# Patient Record
Sex: Female | Born: 1935 | Race: Black or African American | Hispanic: No | Smoking: Never smoker
Health system: Southern US, Community
[De-identification: ages and names within clinical notes are randomized; demographics above are authoritative.]

## PROBLEM LIST (undated history)

## (undated) DIAGNOSIS — N184 Chronic kidney disease, stage 4 (severe): Secondary | ICD-10-CM

## (undated) DIAGNOSIS — I5032 Chronic diastolic (congestive) heart failure: Secondary | ICD-10-CM

## (undated) DIAGNOSIS — I442 Atrioventricular block, complete: Secondary | ICD-10-CM

## (undated) DIAGNOSIS — I639 Cerebral infarction, unspecified: Secondary | ICD-10-CM

## (undated) DIAGNOSIS — I10 Essential (primary) hypertension: Secondary | ICD-10-CM

## (undated) HISTORY — DX: Atrioventricular block, complete: I44.2

## (undated) HISTORY — PX: CATARACT EXTRACTION: SUR2

---

## 1998-05-08 ENCOUNTER — Ambulatory Visit (HOSPITAL_COMMUNITY): Admission: RE | Admit: 1998-05-08 | Discharge: 1998-05-08 | Payer: Self-pay | Admitting: Orthopedic Surgery

## 1998-06-27 ENCOUNTER — Encounter (HOSPITAL_COMMUNITY): Admission: RE | Admit: 1998-06-27 | Discharge: 1998-09-25 | Payer: Self-pay | Admitting: *Deleted

## 1998-07-05 ENCOUNTER — Ambulatory Visit (HOSPITAL_COMMUNITY): Admission: RE | Admit: 1998-07-05 | Discharge: 1998-07-05 | Payer: Self-pay | Admitting: *Deleted

## 1999-05-02 ENCOUNTER — Ambulatory Visit (HOSPITAL_COMMUNITY): Admission: RE | Admit: 1999-05-02 | Discharge: 1999-05-02 | Payer: Self-pay | Admitting: Internal Medicine

## 1999-07-03 ENCOUNTER — Emergency Department (HOSPITAL_COMMUNITY): Admission: EM | Admit: 1999-07-03 | Discharge: 1999-07-03 | Payer: Self-pay | Admitting: Emergency Medicine

## 2000-11-10 ENCOUNTER — Ambulatory Visit (HOSPITAL_COMMUNITY): Admission: RE | Admit: 2000-11-10 | Discharge: 2000-11-10 | Payer: Self-pay | Admitting: Internal Medicine

## 2001-04-01 ENCOUNTER — Encounter: Payer: Self-pay | Admitting: Internal Medicine

## 2001-04-01 ENCOUNTER — Ambulatory Visit (HOSPITAL_COMMUNITY): Admission: RE | Admit: 2001-04-01 | Discharge: 2001-04-01 | Payer: Self-pay | Admitting: Internal Medicine

## 2001-10-18 ENCOUNTER — Encounter: Admission: RE | Admit: 2001-10-18 | Discharge: 2001-10-18 | Payer: Self-pay | Admitting: Internal Medicine

## 2001-10-18 ENCOUNTER — Encounter: Payer: Self-pay | Admitting: Internal Medicine

## 2001-11-22 ENCOUNTER — Ambulatory Visit (HOSPITAL_COMMUNITY): Admission: RE | Admit: 2001-11-22 | Discharge: 2001-11-22 | Payer: Self-pay | Admitting: Internal Medicine

## 2001-11-22 ENCOUNTER — Encounter: Payer: Self-pay | Admitting: Internal Medicine

## 2001-12-22 ENCOUNTER — Encounter: Payer: Self-pay | Admitting: Ophthalmology

## 2001-12-22 ENCOUNTER — Emergency Department (HOSPITAL_COMMUNITY): Admission: RE | Admit: 2001-12-22 | Discharge: 2001-12-22 | Payer: Self-pay | Admitting: Ophthalmology

## 2002-03-21 ENCOUNTER — Encounter: Payer: Self-pay | Admitting: Neurology

## 2002-03-21 ENCOUNTER — Ambulatory Visit (HOSPITAL_COMMUNITY): Admission: RE | Admit: 2002-03-21 | Discharge: 2002-03-21 | Payer: Self-pay | Admitting: Neurology

## 2002-06-02 ENCOUNTER — Emergency Department (HOSPITAL_COMMUNITY): Admission: EM | Admit: 2002-06-02 | Discharge: 2002-06-02 | Payer: Self-pay | Admitting: Emergency Medicine

## 2002-07-30 ENCOUNTER — Encounter: Payer: Self-pay | Admitting: Neurology

## 2002-07-30 ENCOUNTER — Ambulatory Visit (HOSPITAL_COMMUNITY): Admission: RE | Admit: 2002-07-30 | Discharge: 2002-07-30 | Payer: Self-pay | Admitting: Neurology

## 2003-03-29 ENCOUNTER — Encounter: Admission: RE | Admit: 2003-03-29 | Discharge: 2003-03-29 | Payer: Self-pay | Admitting: Orthopedic Surgery

## 2003-03-29 ENCOUNTER — Encounter: Payer: Self-pay | Admitting: Orthopedic Surgery

## 2003-04-24 ENCOUNTER — Encounter: Payer: Self-pay | Admitting: *Deleted

## 2003-04-24 ENCOUNTER — Ambulatory Visit (HOSPITAL_COMMUNITY): Admission: RE | Admit: 2003-04-24 | Discharge: 2003-04-24 | Payer: Self-pay | Admitting: *Deleted

## 2003-09-25 ENCOUNTER — Inpatient Hospital Stay (HOSPITAL_COMMUNITY): Admission: RE | Admit: 2003-09-25 | Discharge: 2003-09-28 | Payer: Self-pay | Admitting: Orthopedic Surgery

## 2003-09-28 ENCOUNTER — Inpatient Hospital Stay (HOSPITAL_COMMUNITY)
Admission: RE | Admit: 2003-09-28 | Discharge: 2003-10-02 | Payer: Self-pay | Admitting: Physical Medicine & Rehabilitation

## 2004-01-17 ENCOUNTER — Other Ambulatory Visit: Admission: RE | Admit: 2004-01-17 | Discharge: 2004-01-17 | Payer: Self-pay | Admitting: Internal Medicine

## 2004-02-07 ENCOUNTER — Encounter: Admission: RE | Admit: 2004-02-07 | Discharge: 2004-02-07 | Payer: Self-pay | Admitting: Orthopedic Surgery

## 2004-02-22 ENCOUNTER — Encounter: Admission: RE | Admit: 2004-02-22 | Discharge: 2004-02-22 | Payer: Self-pay | Admitting: Orthopedic Surgery

## 2004-03-07 ENCOUNTER — Other Ambulatory Visit: Admission: RE | Admit: 2004-03-07 | Discharge: 2004-03-07 | Payer: Self-pay | Admitting: Obstetrics and Gynecology

## 2004-03-09 ENCOUNTER — Emergency Department (HOSPITAL_COMMUNITY): Admission: EM | Admit: 2004-03-09 | Discharge: 2004-03-09 | Payer: Self-pay | Admitting: Emergency Medicine

## 2004-03-13 ENCOUNTER — Encounter: Admission: RE | Admit: 2004-03-13 | Discharge: 2004-03-13 | Payer: Self-pay | Admitting: Orthopedic Surgery

## 2004-04-02 ENCOUNTER — Encounter: Admission: RE | Admit: 2004-04-02 | Discharge: 2004-04-02 | Payer: Self-pay | Admitting: Orthopedic Surgery

## 2005-02-10 ENCOUNTER — Other Ambulatory Visit: Admission: RE | Admit: 2005-02-10 | Discharge: 2005-02-10 | Payer: Self-pay | Admitting: Internal Medicine

## 2006-08-04 ENCOUNTER — Ambulatory Visit (HOSPITAL_COMMUNITY): Admission: RE | Admit: 2006-08-04 | Discharge: 2006-08-04 | Payer: Self-pay | Admitting: Internal Medicine

## 2007-01-20 ENCOUNTER — Emergency Department (HOSPITAL_COMMUNITY): Admission: EM | Admit: 2007-01-20 | Discharge: 2007-01-20 | Payer: Self-pay | Admitting: Family Medicine

## 2007-03-31 ENCOUNTER — Encounter: Admission: RE | Admit: 2007-03-31 | Discharge: 2007-04-06 | Payer: Self-pay | Admitting: Otolaryngology

## 2008-05-21 ENCOUNTER — Emergency Department (HOSPITAL_COMMUNITY): Admission: EM | Admit: 2008-05-21 | Discharge: 2008-05-21 | Payer: Self-pay | Admitting: Family Medicine

## 2009-04-03 ENCOUNTER — Ambulatory Visit (HOSPITAL_COMMUNITY): Admission: RE | Admit: 2009-04-03 | Discharge: 2009-04-03 | Payer: Self-pay | Admitting: Internal Medicine

## 2009-04-07 ENCOUNTER — Encounter: Admission: RE | Admit: 2009-04-07 | Discharge: 2009-04-07 | Payer: Self-pay | Admitting: Orthopedic Surgery

## 2009-07-02 ENCOUNTER — Ambulatory Visit (HOSPITAL_COMMUNITY): Admission: RE | Admit: 2009-07-02 | Discharge: 2009-07-02 | Payer: Self-pay | Admitting: Internal Medicine

## 2009-07-06 ENCOUNTER — Inpatient Hospital Stay (HOSPITAL_COMMUNITY): Admission: AD | Admit: 2009-07-06 | Discharge: 2009-07-10 | Payer: Self-pay | Admitting: Internal Medicine

## 2010-01-09 ENCOUNTER — Encounter: Admission: RE | Admit: 2010-01-09 | Discharge: 2010-01-09 | Payer: Self-pay | Admitting: Orthopedic Surgery

## 2011-03-01 LAB — GLUCOSE, CAPILLARY
Glucose-Capillary: 92 mg/dL (ref 70–99)
Glucose-Capillary: 102 mg/dL — ABNORMAL HIGH (ref 70–99)
Glucose-Capillary: 116 mg/dL — ABNORMAL HIGH (ref 70–99)
Glucose-Capillary: 123 mg/dL — ABNORMAL HIGH (ref 70–99)
Glucose-Capillary: 140 mg/dL — ABNORMAL HIGH (ref 70–99)
Glucose-Capillary: 145 mg/dL — ABNORMAL HIGH (ref 70–99)
Glucose-Capillary: 146 mg/dL — ABNORMAL HIGH (ref 70–99)
Glucose-Capillary: 155 mg/dL — ABNORMAL HIGH (ref 70–99)
Glucose-Capillary: 223 mg/dL — ABNORMAL HIGH (ref 70–99)
Glucose-Capillary: 83 mg/dL (ref 70–99)

## 2011-03-01 LAB — CBC
HCT: 33.7 % — ABNORMAL LOW (ref 36.0–46.0)
HCT: 32.6 % — ABNORMAL LOW (ref 36.0–46.0)
HCT: 34.6 % — ABNORMAL LOW (ref 36.0–46.0)
Hemoglobin: 11.4 g/dL — ABNORMAL LOW (ref 12.0–15.0)
Hemoglobin: 11.1 g/dL — ABNORMAL LOW (ref 12.0–15.0)
Hemoglobin: 11.8 g/dL — ABNORMAL LOW (ref 12.0–15.0)
MCHC: 33.4 g/dL (ref 30.0–36.0)
MCHC: 33.9 g/dL (ref 30.0–36.0)
MCHC: 34.6 g/dL (ref 30.0–36.0)
MCV: 90.1 fL (ref 78.0–100.0)
MCV: 90.2 fL (ref 78.0–100.0)
MCV: 90.3 fL (ref 78.0–100.0)
MCV: 90.3 fL (ref 78.0–100.0)
Platelets: 242 10*3/uL (ref 150–400)
Platelets: 246 10*3/uL (ref 150–400)
RBC: 3.68 MIL/uL — ABNORMAL LOW (ref 3.87–5.11)
RBC: 3.62 MIL/uL — ABNORMAL LOW (ref 3.87–5.11)
RBC: 3.7 MIL/uL — ABNORMAL LOW (ref 3.87–5.11)
RBC: 3.84 MIL/uL — ABNORMAL LOW (ref 3.87–5.11)
RDW: 13.9 % (ref 11.5–15.5)
WBC: 8.3 10*3/uL (ref 4.0–10.5)
WBC: 8.5 10*3/uL (ref 4.0–10.5)
WBC: 9.2 10*3/uL (ref 4.0–10.5)

## 2011-03-01 LAB — BASIC METABOLIC PANEL
BUN: 27 mg/dL — ABNORMAL HIGH (ref 6–23)
CO2: 29 mEq/L (ref 19–32)
CO2: 26 mEq/L (ref 19–32)
CO2: 28 mEq/L (ref 19–32)
Calcium: 8.8 mg/dL (ref 8.4–10.5)
Calcium: 9.1 mg/dL (ref 8.4–10.5)
Chloride: 105 mEq/L (ref 96–112)
Chloride: 103 mEq/L (ref 96–112)
Chloride: 103 mEq/L (ref 96–112)
Creatinine, Ser: 1.06 mg/dL (ref 0.4–1.2)
Creatinine, Ser: 0.98 mg/dL (ref 0.4–1.2)
Creatinine, Ser: 1.06 mg/dL (ref 0.4–1.2)
GFR calc Af Amer: >60 mL/min (ref >60)
GFR calc non Af Amer: 49 mL/min — ABNORMAL LOW (ref >60)
Glucose, Bld: 117 mg/dL — ABNORMAL HIGH (ref 70–99)
Glucose, Bld: 201 mg/dL — ABNORMAL HIGH (ref 70–99)
Potassium: 3.8 mEq/L (ref 3.5–5.1)

## 2011-03-01 LAB — GLYCOHEMOGLOBIN, TOTAL: Hemoglobin-A1c: 7.2 % (ref 5.4–7.4)

## 2011-03-01 LAB — URINALYSIS, MICROSCOPIC ONLY
Ketones, ur: NEGATIVE mg/dL
Nitrite: NEGATIVE
Protein, ur: NEGATIVE mg/dL

## 2011-03-01 LAB — COMPREHENSIVE METABOLIC PANEL
ALT: 20 U/L (ref 0–35)
CO2: 27 mEq/L (ref 19–32)
Calcium: 9.1 mg/dL (ref 8.4–10.5)
Chloride: 104 mEq/L (ref 96–112)
Creatinine, Ser: 1.09 mg/dL (ref 0.4–1.2)
GFR calc non Af Amer: 48 mL/min — ABNORMAL LOW (ref >60)
Glucose, Bld: 196 mg/dL — ABNORMAL HIGH (ref 70–99)
Sodium: 137 mEq/L (ref 135–145)
Total Bilirubin: 0.8 mg/dL (ref 0.3–1.2)

## 2011-03-01 LAB — SEDIMENTATION RATE: Sed Rate: 27 mm/hr — ABNORMAL HIGH (ref 0–22)

## 2011-03-01 LAB — APTT: aPTT: 33 seconds (ref 24–37)

## 2011-03-15 DIAGNOSIS — I10 Essential (primary) hypertension: Secondary | ICD-10-CM | POA: Insufficient documentation

## 2011-03-17 ENCOUNTER — Other Ambulatory Visit (HOSPITAL_COMMUNITY)
Admission: RE | Admit: 2011-03-17 | Discharge: 2011-03-17 | Disposition: A | Discharge status: Home / Self Care | Payer: Medicare Other | Source: Ambulatory Visit | Attending: Internal Medicine | Admitting: Internal Medicine

## 2011-03-17 DIAGNOSIS — Z124 Encounter for screening for malignant neoplasm of cervix: Secondary | ICD-10-CM | POA: Insufficient documentation
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
## 2011-04-11 NOTE — Discharge Summary (Signed)
NAME:  Victoria Lloyd, Victoria Lloyd                       ACCOUNT NO.:  [id number]   MEDICAL RECORD NO.:  [id number]                   PATIENT TYPE:  IPS   LOCATION:  [id number]                                 FACILITY:  [facility]   PHYSICIAN:  John L. Rendall, M.D.               DATE OF BIRTH:  [birth year]   DATE OF ADMISSION:  09/28/2003  DATE OF DISCHARGE:  09/28/2003                                 DISCHARGE SUMMARY   ADMISSION DIAGNOSES:  1. End-stage osteoarthritis of bilateral knees, status post right knee     replacement in 1997.  2. Hypertension.  3. Diverticulosis with persistent diarrhea.  4. Degenerative joint disease of the thoracic spine.  5. Fibromyalgia.  6. Mild gastroesophageal reflux disease.  7. Osteoarthritis.  8. History of pelvic fracture.  9. History of chronic ear infection, treated with myringotomy tube.  10.      History of stroke in January 2001.   DISCHARGE DIAGNOSES:  1. End-stage osteoarthritis, bilateral knee, status post left knee     replacement in 2004 and right knee replacement in 1997.  2. Acute blood loss anemia secondary to surgery.  3. Mild confusion due to Dilaudid, now resolved.  4. Hypertension.  5. Diverticulosis with persistent diarrhea.  6. Degenerative joint disease, thoracic spine.  7. Fibromyalgia.  8. Mild gastroesophageal reflux disease.  9. Osteoarthritis.  10.      History of pelvic fracture.  11.      History of chronic ear infection treated with myringotomy tubes.  12.      History of stroke in January 2001.   SURGICAL PROCEDURE:  On September 25, 2003, Victoria Lloyd underwent a left total  knee arthroplasty by Dr. Oretha Caprice, assisted by Zenaida Deed, PA-C. She  had a DePuy LCS complete metal back patella cemented size standard with an  LCS complete primary femoral component cemented size standard left.  A DePuy  MBT Keels tibial tray cemented size 3 and an LCS complete RP insert size  standard 17.5 mm segment.   COMPLICATIONS:  None.   CONSULTATIONS:  1. Rehab medicine and physical therapy consult, September 26, 2003.  2. Occupational therapy consult, September 27, 2003.  3. Internal medicine consult by Dr. Jeanann Lewandowsky, September 28, 2003.   HISTORY OF PRESENT ILLNESS:  This 77 year old black female patient presented  to Dr. Telford Nab with a 10-year history of gradual onset of progressively  worsening left knee pain. She had done well, status post right knee  replacement in 1997, but the pain and instability of the knee on the left  has just gotten worse. At this point the pain is constant and aching over  the medial joint line without radiation. It increased with walking and  decreased with sitting, lying down, or heat. The knee pops and feels like it  might give way at times. She failed conservative treatment and because of  that she presented for left knee  replacement.   HOSPITAL COURSE:  Victoria Lloyd tolerated her surgical procedure well without  immediate postoperative complications.  She was subsequently transferred to  5000. On postoperative day one, temperature maximum was 100, vital signs  were stable. She was sedated still due to anesthesia and PCA, but pain was  well controlled. Her leg was neurovascularly intact. She was weaned off her  oxygen and started on PT per protocol.   On postoperative day two she had some problems with confusion during the  night, so PCA was discontinued. Temperature maximum was 101.3 and vital  signs were stable.  Hemoglobin was 8.4, hematocrit 25, and she was  asymptomatic. I was felt we could just watch this and Dr. Carlis Abbott was  consulted for medical management. The left knee incision was well  approximated with staples and minimal drainage. She was continued on therapy  and switched to p.o. pain medications.   On September 28, 2003, temperature maximum is 99.6, vital signs stable.  Hemoglobin 8.3, hematocrit 24.4. She remained asymptomatic with a low blood  count. Her left knee  incision was well approximated, but she does have calf  pain with palpation on the left. Both ankles, however, also tender to  palpation. We will get bilateral lower extremity Dopplers to rule out the  DVT and Dr. Carlis Abbott feels that we can keep her on iron and adjust her blood  pressure medications, and not transfuse her at this time.  She is ready for  a transfer to rehab when a rehab bed is available and she will be  transferred to rehab later today.   DISCHARGE INSTRUCTIONS:   DIET:  Continue her current hospitalization diet.   MEDICATIONS:  Continue current hospitalization medications with adjustments  to be made per the rehab physicians.   ACTIVITY:  She can be out of bed, weightbearing as tolerated on the left leg  with the use of a walker.  She is to continue PT and OT per rehab protocol.  On discharge, she is set up with [facility] for home health PT  and services. She will need to continue CPM at this time 0-100 degrees 16  hours a day.   WOUND CARE:  Please clean the left knee incision with Betadine daily and  apply a dry dressing.  Staples can be removed with Steri-Strips with Benzoin  applied on postoperative day 14. If she is still in rehab at that time it  can be done there; otherwise, she needs to follow up with Dr. Telford Nab at  that time.   FOLLOWUP:  She needs to follow up with Dr. Telford Nab in our office on  postoperative day 14 if her staples are still intact. Otherwise, she can  follow up with him about a week or so after her discharge from rehab. She  can call [phone] for that  appointment. Please notify Dr. Telford Nab if  temperature greater than 101.5, chills or pain unrelieved by pain  medications or foul-smelling drainage from the wounds.   LABORATORY DATA:  Chest x-ray done on September 19, 2003, showed a calcified  granulomata associated with hilar areas. No evidence of active disease.  On September 26, 2003, hemoglobin was 10.3, hematocrit 30.5.  On  September 27, 2003, hemoglobin was 8.4, hematocrit 25.  On September 28, 2003, white count  8.3, hematocrit 24.4, and platelet count 210,000. On September 26, 2003,  glucose was 175, calcium 8.2. On September 27, 2003, glucose was 154, calcium  8.4. All other  laboratory studies were within normal limits.      Vonita Moss Duffy, P.A.                      John L. Telford Nab, M.D.    KED/MEDQ  D:  09/28/2003  T:  09/28/2003  Job:  [id number]   cc:   Jeanann Lewandowsky, M.D.  [address] [place] [address]  Fax: [phone]

## 2011-04-11 NOTE — Consult Note (Signed)
[facility]  Patient:    Victoria Lloyd, Victoria Lloyd Visit Number: [id number] MRN: [id number]          Service Type: EMS Location: ED Attending Physician:  Nat Christen Dictated by:   Rob Hickman, M.D. Proc. Date: 06/02/02 Admit Date:  06/02/2002 Discharge Date: 06/02/2002   CC:         Don Broach. Carlis Abbott, M.D.   Consultation Report  DATE OF BIRTH:  [birth year].  BRIEF HISTORY:  I had the pleasure to see the patient in the [facility] Emergency Room.  The patient is noted to have sustained an injury to her left hand while in [facility] on Monday, May 30, 2002. The patient states that she slipped and fell over a cobblestone surface on her outstretched hand and injured the nondominant left hand at the base of her thumb joint.  She has had tried ice as well as hydrocodone for pain.  Since that time she has returned home and had x-rays taken and was referred to the emergency room for evaluation and treatment.  She denies numbness or tingling, locking, popping or catching.  Her main complaint is pain over the base of the thumb joint over the thumb.  The patient notes no ulnar sided wrist pain or index to small finger pain.  I have gone her examination with her at length today.  She was seen by Carmelina Peal, P.A.-C., and was referred to [facility] to rule out fracture.  PAST MEDICAL HISTORY:  Reviewed.  MEDICATIONS:  1. Aspirin.  2. Norvasc.  3. Diovan.  4. Celexa.  5. Vioxx  6. Prevacid.  7. Os-Cal.  8. Centrum silver.  9. Flonase. 10. Ultracet 11. Guanine sulfate. 12. Vitamin B6. 13. Osteobioflex.  ALLERGIES:  None.  PAST MEDICAL HISTORY:  1. Significant for osteoarthritis.  2. Allergic rhinitis.  3. Reflux.  4. Depression.  5. Hypertension.  6. History of right total knee replacement by Dr. Emogene Morgan. Lavender.  SOCIAL HISTORY:  She is a nonsmoker.  REGULAR PHYSICIAN:  Don Broach. Carlis Abbott, M.D.  PHYSICAL EXAMINATION:  GENERAL:   This is a very pleasant black female, alert and oriented, in no acute distress.  VITAL SIGNS:  The patient has normal pulses in the upper extremities.  Wrist is evaluated about the left hand and is significant for [facility] joint pain with palpation.  MP and IP joints are nontender.  There is no signs of dystrophy, vascular compromise, infection or peripheral nerve compression. Her main findings today are indicative of basilar thumb joint pain.  The right upper extremity is neurovascularly intact.  Normal stability, sensation, and no abnormality.  She ambulates well.  CHEST AND ABDOMEN:  BEnign.  X-RAYS:  X-rays were reviewed.  These are previously taken x-rays from outside the hospital and I have reviewed them at length today.  The patient has significant degenerative disk disease especially of the [facility] and IP joint of the left thumb.  The MCP joint is also involved but less so than the aforementioned joints.  IMPRESSION:  Base of thumb joint arthritis with extreme exacerbation status post fall in [place] 3 days ago.  PLAN:  I have discussed with the patient our findings.  I have placed in a removable splint and I have placed her on Vicodin for extreme pain.  she will return to the office in 7 days.  If not improved I would consider a corticosteroid injection at that point in time.  I have gone over  with her the dos and donts, etc. and all questions have been encouraged and answered. She tolerated the splinting well and will be able to remove this for dressing changes etc.  All questions have been encouraged and answered. Dictated by:   Rob Hickman, M.D. Attending Physician:  Nat Christen DD:  06/02/02 TD:  06/06/02 Job: [id number]

## 2011-04-11 NOTE — Discharge Summary (Signed)
NAMEDENEENE, GURTNER                       ACCOUNT NO.:  [id number]   MEDICAL RECORD NO.:  [id number]                   PATIENT TYPE:  IPS   LOCATION:  [id number]                                 FACILITY:  [facility]   PHYSICIAN:  Meredith Staggers, M.D.             DATE OF BIRTH:  1922-03-05   DATE OF ADMISSION:  09/28/2003  DATE OF DISCHARGE:  10/02/2003                                 DISCHARGE SUMMARY   DISCHARGE DIAGNOSES:  1. Left total knee arthroplasty secondary to degenerative joint disease,     September 26, 2003.  2. Pain management.  3. Anemia.  4. Subcutaneous Arixtra for deep venous thrombosis prophylaxis.  5. Gastroesophageal reflux disease.  6. Fibromyalgia.  7. Hypertension.  8. Diverticulosis.  9. Depression.  10.      History of a right total knee arthroplasty in 1997.   HISTORY OF PRESENT ILLNESS:  [age]-year-old female with history of  right total knee arthroplasty in 1997 for which she received inpatient rehab  services.  Patient now admitted, September 25, 2003, with end-stage  degenerative joint disease of the left knee and no relief with conservative  care.  She underwent a left total knee arthroplasty, September 26, 2003, per  Dr. Jenny Reichmann L. Rendall.  Placed on Arixtra for deep venous thrombosis  prophylaxis, weightbearing as tolerated.  Postoperative pain management with  Lidoderm patch, Foley catheter tube, September 26, 2003.  Anemia -- 8.3, and  placed on iron supplement.  Monitored of blood pressure, total assist for  bed mobility, admitted for a comprehensive rehab program.   PAST MEDICAL HISTORY:  See discharge diagnoses.   PAST SURGICAL HISTORY:  1. Cataract surgery.  2. Hysterectomy.   ALLERGIES:  LACTOSE intolerance.   PRIMARY MEDICAL DOCTOR:  Dr. Jeanann Lewandowsky.   HABITS:  Occasional alcohol, no tobacco.   MEDICATIONS PRIOR TO ADMISSION:  Aspirin, Norvasc, Diovan, Celexa, Lidoderm  patch, Zanaflex, Os-Cal, multivitamin, Bextra and quinine  sulfate.   SOCIAL HISTORY:  Lives alone in [place], independent with a cane prior to  admission, one-level home, four steps to entry.  Son can help after October 02, 2003, as he is from outside the area.   HOSPITAL COURSE:  The patient did well while in rehabilitation services,  with therapies initiated on a b.i.d. basis.  The following issues were  followed during the patient's rehab course:  Pertaining to Ms. Hissong's  left total knee arthroplasty, surgical site healing nicely, neurovascular  sensation remained intact.  She would follow up with Dr. Telford Nab for removal  of staples.  She was weightbearing as tolerated, CPM machine at 80 degrees,  home health therapies had been arranged.  Pain management ongoing with the  use of Lidoderm, Bextra, Zanaflex, as she was on prior to hospital admission  for her history of fibromyalgia; she was also using oxycodone as needed for  her pain.  Postoperative anemia:  She was transfused during her  rehab  course, two units of packed red blood cells for a hemoglobin of 8 on  September 29, 2003.  Followup hemoglobins were 11.4.  She completed her course  of subcutaneous Arixtra for deep venous thrombosis prophylaxis with two days  after her discharge.  Her blood pressures remained controlled.  She had some  mild hypokalemia which improved with supplement to 4.3.  No bowel or bladder  disturbances.  She was independent in her room with mobility, ambulating  extended distances, needing minimal assist for lower body dressing.  Home  health therapies would be ongoing.   DISCHARGE MEDICATIONS:  1. Norvasc 10 mg daily.  2. Diovan daily.  3. Celexa 20 mg daily.  4. Bextra 20 mg daily.  5. Lidoderm patch 5% topical every 12 hours.  6. Trinsicon twice daily.  7. Zanaflex 2 mg at bedtime.  8. Protonix daily.  9. Quinine sulfate as needed.  10.      Oxycodone 5 mg one or two tablets every four to six hours as needed     -- pain.  11.      Arixtra 2.5  mg daily x2 more days.   ACTIVITY:  Activity as tolerated.   DIET:  Diet was regular.   SPECIAL INSTRUCTIONS:  Follow up with Dr. Telford Nab in one week for removal of  staples.  Patient to resume aspirin therapy after Arixtra completed.      Lauraine Rinne, P.A.                     Meredith Staggers, M.D.    DA/MEDQ  D:  10/02/2003  T:  10/02/2003  Job:  [id number]   cc:   Meredith Staggers, M.D.  [address]  [place]  [place] [address]  Fax: [phone]   Jeanann Lewandowsky, M.D.  [address], [place]  [place] [address]  Fax: Davis Rendall III, M.D.  [place] [place]  [place] [address]  Fax: [phone]

## 2011-04-11 NOTE — Consult Note (Signed)
[facility]  Patient:    Victoria Lloyd, Victoria Lloyd Visit Number: [id number] MRN: [id number]          Service Type: EMS Location: ED Attending Physician:  Dot Lanes Dictated by:   Alyson Locket. Love, M.D. Proc. Date: 12/22/01 Admit Date:  12/22/2001 Discharge Date: 12/22/2001   CC:         Mikeal Hawthorne L. Elonda Husky, M.D.             Don Broach. Carlis Abbott, M.D.                          Consultation Report  DATE OF BIRTH:  Jan 04, 1922.  REASON FOR CONSULTATION:  This is a 77 year old, right-handed, black, widowed female who lives alone.  She was seen in the emergency room after being sent there by Dr. Valora Piccolo where MRI study of the brain was thought to show evidence of a right occipital left stroke.  HISTORY OF PRESENT ILLNESS:  Victoria Lloyd has a 50-year history of hypertension.  Approximately two weeks ago she noted the onset of visual disturbance characterized by colored lights lasting approximately two minutes in both eyes, occurring as often as every hour.  There has been no change in that visual disturbance, frequency or severity over the last two weeks.  It is not associated with headache.  It is visual disturbance only in the left visual field and it is present when she closes her eyes.  She was seen by Dr. Valora Piccolo on 12/21/01 and notd to have evidence of a left visual field cut and he ordered an MRI on 12/22/01 showing evidence of a small linear DWI lesion along the calcarine cortex in the right occipital region without any associated mass effect, without definitely being seen on the flare study and without definitely being seen on the T2.  There was no associated enhancement.  The MRI did show evidence of paraventricular small vessel ischemic strokes. Dr. Elonda Husky then referred her to the [facility] emergency room.  There EKG was thought to show anterior MI, age indeterminate but according to Dr. Meredith Pel this was not thought to really be an  MI.  Her 2-D echocardiogram was unremarkable except for mild calcifications of the aortic valve and mild dilatation of the left atrium.  Her Doppler study of the carotid showed no ICA stenosis bilaterally and antegrade vertebral flow bilaterally.  Her telemetry showed normal sinus rhythm.  Her blood studies showed sodium 138, potassium 2.8, chloride 108, CO2 content 27, BUN 23, creatinine 0.9, glucose 88.  Liver function tests normal.  PTT 36, PT 14.0 with an INR of 1.1.  Her hemoglobin has not returned at this time. Hemoglobin 12.7, hematocrit 37.6, white blood cell count 6500, platelets 256,000.  Chest x-ray showed elevation of the right hemidiaphragm.  PAST MEDICAL HISTORY:  Significant for hypertension for 50 years, cesarean section, hysterectomy, right knee replacement, myringotomy right ear.  MEDICATIONS:  Norvasc, Diovan, Vioxx, and Celexa.  She is not on any aspirin therapy.  SOCIAL HISTORY:  She does not smoke cigarettes.  PHYSICAL EXAMINATION:  GENERAL:  Well-developed, pleasant black female. VITAL SIGNS:  Blood pressure lying in the right and left arm 140/80, heart rate 64.  There were no bruits.  NEUROLOGIC:  She was alert and oriented.  Cranial nerve examination of the visual fields showed an incomplete left homonymous hemianopsia. Disk was flat on the left.  The right was not well seen.  The extraocular movements were full.  Pupils reactive from 3 to 2 bilaterally.  There was no facial asymmetry.  Hearing was intact with air conduction greater than bone conduction.  Tongue was benign.  Uvula was benign.  Gags were present.  Motor exam showed 5/5 strength in the upper and lower extremities.  She had good finger-to-nose, good heel-to-shin.  Rapid alternating movements revealed central examination to be intact.  Deep tendon reflexes were 1-2+ and plantar responses were downgoing.  IMPRESSION: 1. Suspect irritable focus in the right occipital lobe, most likely  seizures.    Code 345.50. 2. Right occipital lobe stroke, suspected by MRI.  Code 434.01. 3. Hypertension. Code 706.2. 4. Bicerebral small vessel ischemic strokes by MRI that are old.  PLAN:  The plan at this time is to place her on aspirin 325 mg per day and Neurontin 300 mg t.i.d. and talk with her in the morning regarding her course. ictated by:   Alyson Locket. Love, M.D. Attending Physician:  Dot Lanes DD:  12/22/01 TD:  12/23/01 Job: [id number]

## 2011-04-11 NOTE — Op Note (Signed)
NAME:  [name], Victoria Lloyd                       ACCOUNT NO.:  [id number]   MEDICAL RECORD NO.:  [id number]                   PATIENT TYPE:  INP   LOCATION:  2550                                 FACILITY:  [facility]   PHYSICIAN:  John L. Rendall III, M.D.           DATE OF BIRTH:  04-04-1922   DATE OF PROCEDURE:  09/25/2003  DATE OF DISCHARGE:                                 OPERATIVE REPORT   PREOPERATIVE DIAGNOSIS:  Osteoarthritis, left knee, end stage.   POSTOPERATIVE DIAGNOSIS:  Osteoarthritis, left knee, end stage.   OPERATION PERFORMED:  Left LCS total knee replacement.   SURGEON:  John L. Rendall, M.D.   ASSISTANT:  [name], P.A.   ANESTHESIA:  General.   PATHOLOGY:  Bone-on-bone medial compartment, left knee with chronic pain  resistant to all conservative measures.   INDICATIONS FOR PROCEDURE:  Chronic pain.   DESCRIPTION OF PROCEDURE:  Under general anesthesia, the left leg was  prepared with DuraPrep and draped as a sterile field.  Proximal thigh  tourniquet was applied sterilely.  The leg was wrapped out with the Esmarch  and the tourniquet was used at 350 mmHg.  A midline incision was made.  The  patella was everted.  Medial capsular release was performed from the tibia  to balance the varus deformity.  The femur was sized at a standard.  Using  the tibial guide, the proximal tibial resection was made.  Using the first  femoral guide an intercondylar drill hole was placed.  Using the second  guide, the anterior and posterior flare of the distal femur were resected  with a balanced 17.5 flexion gap.  Using the intramedullary guide, a distal  femoral cut was made with a 17.5 extension gap.  Recessing guide was then  used.  Lamina spreader was then inserted.  Remnants of the menisci,  popliteus tendon and cruciates were resected.  Bone spurs were taken off the  back of the femoral condyles.  The tibia was then sized at a 3.  The center  peg hole of the keel was  placed.  The 17.5 bearing and femoral component  were applied in trials and a three peg hole patella was applied after  osteotomizing the patella.  Excellent fit, alignment and stability were  obtained in varus, valgus and drawers testing. Permanent components were  then obtained while boy surfaces were prepared with pulse irrigation and  antibiotic solution.  Permanent components were then cemented in place and  once cement was hardened, the tourniquet was leg down at 53 minutes.  Multiple small vessels were then cauterized.  Medium Hemovac drain was  inserted.  The knee was then closed in layers with #1 Tycron, #1 Vicryl, 2-0  Vicryl and skin clips.  Operative time was approximately one hour and 15  minutes.  The patient tolerated the procedure well and returned to recovery  after a femoral nerve block.  John L. Wynona Luna, M.D.    Judie Grieve  D:  09/25/2003  T:  09/25/2003  Job:  [id number]

## 2011-05-12 ENCOUNTER — Emergency Department (HOSPITAL_COMMUNITY)
Admission: EM | Admit: 2011-05-12 | Discharge: 2011-05-12 | Disposition: A | Discharge status: Home / Self Care | Payer: Medicare Other | Attending: Emergency Medicine | Admitting: Emergency Medicine

## 2011-05-12 DIAGNOSIS — R358 Other polyuria: Secondary | ICD-10-CM | POA: Insufficient documentation

## 2011-05-12 DIAGNOSIS — R3589 Other polyuria: Secondary | ICD-10-CM | POA: Insufficient documentation

## 2011-05-12 DIAGNOSIS — R112 Nausea with vomiting, unspecified: Secondary | ICD-10-CM | POA: Insufficient documentation

## 2011-05-12 DIAGNOSIS — I10 Essential (primary) hypertension: Secondary | ICD-10-CM | POA: Insufficient documentation

## 2011-05-12 DIAGNOSIS — R197 Diarrhea, unspecified: Secondary | ICD-10-CM | POA: Insufficient documentation

## 2011-05-12 DIAGNOSIS — E860 Dehydration: Secondary | ICD-10-CM | POA: Insufficient documentation

## 2011-05-12 LAB — CBC
HCT: 39.8 % (ref 36.0–46.0)
MCH: 28.9 pg (ref 26.0–34.0)
MCHC: 31.9 g/dL (ref 30.0–36.0)
MCV: 90.5 fL (ref 78.0–100.0)
Platelets: 252 10*3/uL (ref 150–400)
RDW: 13.5 % (ref 11.5–15.5)

## 2011-05-12 LAB — POCT I-STAT, CHEM 8
Calcium, Ion: 1.15 mmol/L (ref 1.12–1.32)
Chloride: 100 mEq/L (ref 96–112)
Creatinine, Ser: 2.6 mg/dL — ABNORMAL HIGH (ref 0.50–1.10)
Glucose, Bld: 131 mg/dL — ABNORMAL HIGH (ref 70–99)
Potassium: 3.5 mEq/L (ref 3.5–5.1)

## 2011-05-12 LAB — DIFFERENTIAL
Eosinophils Absolute: 0.1 10*3/uL (ref 0.0–0.7)
Eosinophils Relative: 1 % (ref 0–5)
Lymphocytes Relative: 20 % (ref 12–46)
Lymphs Abs: 1.5 10*3/uL (ref 0.7–4.0)
Monocytes Absolute: 1.2 10*3/uL — ABNORMAL HIGH (ref 0.1–1.0)
Monocytes Relative: 16 % — ABNORMAL HIGH (ref 3–12)

## 2011-09-06 ENCOUNTER — Emergency Department (HOSPITAL_COMMUNITY)
Admission: EM | Admit: 2011-09-06 | Discharge: 2011-09-06 | Disposition: A | Discharge status: Home / Self Care | Payer: Medicare Other | Attending: Emergency Medicine | Admitting: Emergency Medicine

## 2011-09-06 ENCOUNTER — Emergency Department (HOSPITAL_COMMUNITY): Payer: Medicare Other

## 2011-09-06 DIAGNOSIS — Z79899 Other long term (current) drug therapy: Secondary | ICD-10-CM | POA: Insufficient documentation

## 2011-09-06 DIAGNOSIS — F329 Major depressive disorder, single episode, unspecified: Secondary | ICD-10-CM | POA: Insufficient documentation

## 2011-09-06 DIAGNOSIS — M129 Arthropathy, unspecified: Secondary | ICD-10-CM | POA: Insufficient documentation

## 2011-09-06 DIAGNOSIS — R55 Syncope and collapse: Secondary | ICD-10-CM | POA: Insufficient documentation

## 2011-09-06 DIAGNOSIS — R42 Dizziness and giddiness: Secondary | ICD-10-CM | POA: Insufficient documentation

## 2011-09-06 DIAGNOSIS — I10 Essential (primary) hypertension: Secondary | ICD-10-CM | POA: Insufficient documentation

## 2011-09-06 DIAGNOSIS — Z8673 Personal history of transient ischemic attack (TIA), and cerebral infarction without residual deficits: Secondary | ICD-10-CM | POA: Insufficient documentation

## 2011-09-06 DIAGNOSIS — R5383 Other fatigue: Secondary | ICD-10-CM | POA: Insufficient documentation

## 2011-09-06 DIAGNOSIS — R5381 Other malaise: Secondary | ICD-10-CM | POA: Insufficient documentation

## 2011-09-06 DIAGNOSIS — F3289 Other specified depressive episodes: Secondary | ICD-10-CM | POA: Insufficient documentation

## 2011-09-06 LAB — DIFFERENTIAL
Basophils Absolute: 0.1 10*3/uL (ref 0.0–0.1)
Basophils Relative: 1 % (ref 0–1)
Eosinophils Absolute: 0.2 10*3/uL (ref 0.0–0.7)
Eosinophils Relative: 4 % (ref 0–5)
Lymphocytes Relative: 42 % (ref 12–46)
Lymphs Abs: 2.5 K/uL (ref 0.7–4.0)
Monocytes Absolute: 0.6 10*3/uL (ref 0.1–1.0)
Monocytes Relative: 9 % (ref 3–12)
Neutro Abs: 2.6 K/uL (ref 1.7–7.7)
Neutrophils Relative %: 44 % (ref 43–77)

## 2011-09-06 LAB — BASIC METABOLIC PANEL
CO2: 29 mEq/L (ref 19–32)
Calcium: 9.9 mg/dL (ref 8.4–10.5)
Creatinine, Ser: 1.1 mg/dL (ref 0.50–1.10)
GFR calc non Af Amer: 44 mL/min — ABNORMAL LOW (ref >90)
Glucose, Bld: 103 mg/dL — ABNORMAL HIGH (ref 70–99)

## 2011-09-06 LAB — POCT I-STAT TROPONIN I
Troponin i, poc: 0.01 ng/mL (ref 0.00–0.08)
Troponin i, poc: 0.01 ng/mL (ref 0.00–0.08)

## 2011-09-06 LAB — CBC
HCT: 37.3 % (ref 36.0–46.0)
Hemoglobin: 12.4 g/dL (ref 12.0–15.0)
MCH: 29.3 pg (ref 26.0–34.0)
MCHC: 33.2 g/dL (ref 30.0–36.0)
MCV: 88.2 fL (ref 78.0–100.0)
Platelets: 224 10*3/uL (ref 150–400)
RBC: 4.23 MIL/uL (ref 3.87–5.11)
RDW: 13.8 % (ref 11.5–15.5)
WBC: 6 K/uL (ref 4.0–10.5)

## 2011-09-06 LAB — BASIC METABOLIC PANEL WITH GFR
BUN: 21 mg/dL (ref 6–23)
Chloride: 106 meq/L (ref 96–112)
GFR calc Af Amer: 51 mL/min — ABNORMAL LOW (ref >90)
Potassium: 3.6 meq/L (ref 3.5–5.1)
Sodium: 144 meq/L (ref 135–145)

## 2011-09-06 LAB — PROTIME-INR
INR: 1.09 (ref 0.00–1.49)
Prothrombin Time: 14.3 seconds (ref 11.6–15.2)

## 2011-09-06 LAB — APTT: aPTT: 35 s (ref 24–37)

## 2014-08-03 ENCOUNTER — Emergency Department (HOSPITAL_COMMUNITY): Payer: Medicare Other

## 2014-08-03 ENCOUNTER — Encounter (HOSPITAL_COMMUNITY): Payer: Self-pay | Admitting: Emergency Medicine

## 2014-08-03 ENCOUNTER — Inpatient Hospital Stay (HOSPITAL_COMMUNITY)
Admission: EM | Admit: 2014-08-03 | Discharge: 2014-08-06 | DRG: 242 | Disposition: A | Discharge status: Home / Self Care | Payer: Medicare Other | Attending: Cardiology | Admitting: Cardiology

## 2014-08-03 DIAGNOSIS — I129 Hypertensive chronic kidney disease with stage 1 through stage 4 chronic kidney disease, or unspecified chronic kidney disease: Secondary | ICD-10-CM | POA: Diagnosis present

## 2014-08-03 DIAGNOSIS — Z950 Presence of cardiac pacemaker: Secondary | ICD-10-CM | POA: Diagnosis not present

## 2014-08-03 DIAGNOSIS — I442 Atrioventricular block, complete: Principal | ICD-10-CM | POA: Diagnosis present

## 2014-08-03 DIAGNOSIS — I509 Heart failure, unspecified: Secondary | ICD-10-CM | POA: Diagnosis present

## 2014-08-03 DIAGNOSIS — I10 Essential (primary) hypertension: Secondary | ICD-10-CM | POA: Diagnosis present

## 2014-08-03 DIAGNOSIS — N184 Chronic kidney disease, stage 4 (severe): Secondary | ICD-10-CM | POA: Diagnosis present

## 2014-08-03 DIAGNOSIS — Z882 Allergy status to sulfonamides status: Secondary | ICD-10-CM | POA: Diagnosis not present

## 2014-08-03 DIAGNOSIS — Z885 Allergy status to narcotic agent status: Secondary | ICD-10-CM | POA: Diagnosis not present

## 2014-08-03 DIAGNOSIS — R0602 Shortness of breath: Secondary | ICD-10-CM | POA: Diagnosis present

## 2014-08-03 DIAGNOSIS — J810 Acute pulmonary edema: Secondary | ICD-10-CM | POA: Diagnosis present

## 2014-08-03 DIAGNOSIS — Z7982 Long term (current) use of aspirin: Secondary | ICD-10-CM

## 2014-08-03 DIAGNOSIS — R001 Bradycardia, unspecified: Secondary | ICD-10-CM | POA: Diagnosis present

## 2014-08-03 DIAGNOSIS — I5031 Acute diastolic (congestive) heart failure: Secondary | ICD-10-CM | POA: Diagnosis present

## 2014-08-03 HISTORY — DX: Essential (primary) hypertension: I10

## 2014-08-03 LAB — BASIC METABOLIC PANEL
ANION GAP: 13 (ref 5–15)
BUN: 29 mg/dL — ABNORMAL HIGH (ref 6–23)
CALCIUM: 9.5 mg/dL (ref 8.4–10.5)
CO2: 23 mEq/L (ref 19–32)
Chloride: 109 mEq/L (ref 96–112)
Creatinine, Ser: 1.61 mg/dL — ABNORMAL HIGH (ref 0.50–1.10)
GFR, EST AFRICAN AMERICAN: 31 mL/min — AB (ref >90)
GFR, EST NON AFRICAN AMERICAN: 27 mL/min — AB (ref >90)
Glucose, Bld: 140 mg/dL — ABNORMAL HIGH (ref 70–99)
POTASSIUM: 4.4 meq/L (ref 3.7–5.3)
SODIUM: 145 meq/L (ref 137–147)

## 2014-08-03 LAB — TSH: TSH: 1.84 u[IU]/mL (ref 0.350–4.500)

## 2014-08-03 LAB — MRSA PCR SCREENING: MRSA by PCR: NEGATIVE

## 2014-08-03 LAB — CREATININE, SERUM
CREATININE: 1.55 mg/dL — AB (ref 0.50–1.10)
GFR, EST AFRICAN AMERICAN: 33 mL/min — AB (ref >90)
GFR, EST NON AFRICAN AMERICAN: 28 mL/min — AB (ref >90)

## 2014-08-03 LAB — CBC WITH DIFFERENTIAL/PLATELET
BASOS ABS: 0 10*3/uL (ref 0.0–0.1)
BASOS PCT: 1 % (ref 0–1)
EOS ABS: 0.2 10*3/uL (ref 0.0–0.7)
EOS PCT: 3 % (ref 0–5)
HCT: 38.3 % (ref 36.0–46.0)
Hemoglobin: 12.5 g/dL (ref 12.0–15.0)
LYMPHS ABS: 1.8 10*3/uL (ref 0.7–4.0)
Lymphocytes Relative: 31 % (ref 12–46)
MCH: 29.3 pg (ref 26.0–34.0)
MCHC: 32.6 g/dL (ref 30.0–36.0)
MCV: 89.9 fL (ref 78.0–100.0)
Monocytes Absolute: 0.6 10*3/uL (ref 0.1–1.0)
Monocytes Relative: 11 % (ref 3–12)
NEUTROS PCT: 54 % (ref 43–77)
Neutro Abs: 3.3 10*3/uL (ref 1.7–7.7)
PLATELETS: 202 10*3/uL (ref 150–400)
RBC: 4.26 MIL/uL (ref 3.87–5.11)
RDW: 13.8 % (ref 11.5–15.5)
WBC: 6 10*3/uL (ref 4.0–10.5)

## 2014-08-03 LAB — CBC
HCT: 40.5 % (ref 36.0–46.0)
Hemoglobin: 13.1 g/dL (ref 12.0–15.0)
MCH: 29.1 pg (ref 26.0–34.0)
MCHC: 32.3 g/dL (ref 30.0–36.0)
MCV: 90 fL (ref 78.0–100.0)
Platelets: 210 10*3/uL (ref 150–400)
RBC: 4.5 MIL/uL (ref 3.87–5.11)
RDW: 13.6 % (ref 11.5–15.5)
WBC: 6.9 10*3/uL (ref 4.0–10.5)

## 2014-08-03 LAB — TROPONIN I: Troponin I: <0.3 ng/mL (ref <0.30)

## 2014-08-03 LAB — PRO B NATRIURETIC PEPTIDE: Pro B Natriuretic peptide (BNP): 2601 pg/mL — ABNORMAL HIGH (ref 0–450)

## 2014-08-03 LAB — MAGNESIUM: MAGNESIUM: 2.2 mg/dL (ref 1.5–2.5)

## 2014-08-03 LAB — PROTIME-INR
INR: 1.2 (ref 0.00–1.49)
PROTHROMBIN TIME: 15.2 s (ref 11.6–15.2)

## 2014-08-03 LAB — I-STAT TROPONIN, ED: TROPONIN I, POC: 0.01 ng/mL (ref 0.00–0.08)

## 2014-08-03 MED ORDER — SODIUM CHLORIDE 0.9 % IV SOLN: 250.0000 mL | INTRAVENOUS | Status: DC | PRN

## 2014-08-03 MED ORDER — DOPAMINE-DEXTROSE 3.2-5 MG/ML-% IV SOLN
2.0000 ug/kg/min | Freq: Once | INTRAVENOUS | Status: AC
  Administered 2014-08-03: 4.084 ug/kg/min via INTRAVENOUS
  Filled 2014-08-03: qty 250

## 2014-08-03 MED ORDER — ASPIRIN EC 81 MG PO TBEC
81.0000 mg | DELAYED_RELEASE_TABLET | Freq: Every day | ORAL | Status: DC
  Administered 2014-08-04 – 2014-08-06 (×3): 81 mg via ORAL
  Filled 2014-08-03 (×3): qty 1

## 2014-08-03 MED ORDER — HYDRALAZINE HCL 20 MG/ML IJ SOLN
5.0000 mg | INTRAMUSCULAR | Status: DC | PRN
  Administered 2014-08-03 – 2014-08-04 (×4): 5 mg via INTRAVENOUS
  Filled 2014-08-03 (×3): qty 1

## 2014-08-03 MED ORDER — CETYLPYRIDINIUM CHLORIDE 0.05 % MT LIQD
7.0000 mL | Freq: Two times a day (BID) | OROMUCOSAL | Status: DC
  Administered 2014-08-03 – 2014-08-06 (×6): 7 mL via OROMUCOSAL

## 2014-08-03 MED ORDER — NITROGLYCERIN IN D5W 200-5 MCG/ML-% IV SOLN
2.0000 ug/min | INTRAVENOUS | Status: DC
  Administered 2014-08-03: 10 ug/min via INTRAVENOUS
  Filled 2014-08-03: qty 250

## 2014-08-03 MED ORDER — ACETAMINOPHEN 325 MG PO TABS
650.0000 mg | ORAL_TABLET | ORAL | Status: DC | PRN
  Administered 2014-08-04: 650 mg via ORAL
  Filled 2014-08-03: qty 2

## 2014-08-03 MED ORDER — DOPAMINE-DEXTROSE 3.2-5 MG/ML-% IV SOLN: 5.0000 ug/kg/min | INTRAVENOUS | Status: DC

## 2014-08-03 MED ORDER — ENOXAPARIN SODIUM 30 MG/0.3ML ~~LOC~~ SOLN
30.0000 mg | SUBCUTANEOUS | Status: DC
  Administered 2014-08-03 – 2014-08-04 (×2): 30 mg via SUBCUTANEOUS
  Filled 2014-08-03 (×3): qty 0.3

## 2014-08-03 MED ORDER — ASPIRIN 81 MG PO TABS: 81.0000 mg | ORAL_TABLET | Freq: Every day | ORAL | Status: DC

## 2014-08-03 MED ORDER — ASPIRIN 81 MG PO CHEW: 324.0000 mg | CHEWABLE_TABLET | ORAL | Status: AC

## 2014-08-03 MED ORDER — SODIUM CHLORIDE 0.9 % IJ SOLN: 3.0000 mL | INTRAMUSCULAR | Status: DC | PRN

## 2014-08-03 MED ORDER — SODIUM CHLORIDE 0.9 % IJ SOLN
3.0000 mL | Freq: Two times a day (BID) | INTRAMUSCULAR | Status: DC
  Administered 2014-08-03 – 2014-08-05 (×5): 3 mL via INTRAVENOUS

## 2014-08-03 MED ORDER — ONDANSETRON HCL 4 MG/2ML IJ SOLN
4.0000 mg | Freq: Four times a day (QID) | INTRAMUSCULAR | Status: DC | PRN
Start: 2014-08-03 — End: 2014-08-04

## 2014-08-03 MED ORDER — ASPIRIN 300 MG RE SUPP
300.0000 mg | RECTAL | Status: AC
  Filled 2014-08-03: qty 1

## 2014-08-03 MED ORDER — FUROSEMIDE 10 MG/ML IJ SOLN
40.0000 mg | Freq: Once | INTRAMUSCULAR | Status: AC
  Administered 2014-08-03: 40 mg via INTRAVENOUS
  Filled 2014-08-03: qty 4

## 2014-08-03 MED ORDER — ALPRAZOLAM 0.5 MG PO TABS: 0.5000 mg | ORAL_TABLET | Freq: Once | ORAL | Status: DC

## 2014-08-03 MED ORDER — HYDRALAZINE HCL 20 MG/ML IJ SOLN
INTRAMUSCULAR | Status: AC
  Filled 2014-08-03: qty 1

## 2014-08-03 NOTE — Progress Notes (Signed)
Pts SBP continues to be >200 despite receiving hydralazine at 1845. Fellow paged. Orders received to start nitro gtt. Will continue to monitor.  Perkins, [place]

## 2014-08-03 NOTE — H&P (Signed)
CARDIOLOGY HISTORY AND PHYSICAL   Patient ID: Victoria Lloyd MRN: [id number]  DOB/AGE: 04-28-1922 80 y.o. Admit date: 08/03/2014  Primary Care Physician: No primary provider on file. Primary Cardiologist:    Rodney Langton  Clinical Summary Victoria Lloyd is a 80 y.o.female. There is no prior documented cardiac history. Patient was feeling poorly and came to the emergency room at [facility] long. It was noted that her first EKG suggested complete heart block. The second EKG showed 2-1 AV block with significant bradycardia. She was not hypotensive. However her chest x-ray suggested some volume overload. Over time she had increased shortness of breath. She was given Lasix. BiPAP was placed. Dopamine was started to increase the heart rate. She was transferred to [facility]. She is on a small dose of beta blocker of carvedilol 6.25 twice a day. Her creatinine is 1.6.   Allergies  Allergen Reactions  . Codeine     unknown  . Sulfa Antibiotics     unknown    Home Medications  (Not in a hospital admission)  Scheduled Medications    Infusions    PRN Medications       Past Medical History  Diagnosis Date  . Hypertension     History reviewed. No pertinent past surgical history.  There is no significant family history of coronary artery disease.  Social History Victoria Lloyd reports that she has never smoked. She does not have any smokeless tobacco history on file. Victoria Lloyd reports that she does not drink alcohol.  Review of Systems  Physical Examination Temp:  [98 F (36.7 C)-98.2 F (36.8 C)] 98 F (36.7 C) (09/10 1455) Pulse Rate:  [31-65] 31 (09/10 1630) Resp:  [12-23] 22 (09/10 1630) BP: (171-209)/(42-88) 198/88 mmHg (09/10 1630) SpO2:  [90 %-100 %] 100 % (09/10 1630) Weight:  [144 lb (65.318 kg)] 144 lb (65.318 kg) (09/10 1610) No intake or output data in the 24 hours ending 08/03/14 1648  The patient is oriented to person time and place. Affect is normal.  She says that she is anxious. Head is atraumatic. Sclera and conjunctiva are normal. There is no jugulovenous distention. Lungs reveal a few scattered rhonchi. Cardiac exam reveals S1 and S2. The abdomen is soft. There is trace peripheral edema.  Lab Results  Basic Metabolic Panel:  Recent Labs Lab 08/03/14 1414  NA 145  K 4.4  CL 109  CO2 23  GLUCOSE 140*  BUN 29*  CREATININE 1.61*  CALCIUM 9.5    Liver Function Tests: No results found for this basename: AST, ALT, ALKPHOS, BILITOT, PROT, ALBUMIN,  in the last 168 hours  CBC:  Recent Labs Lab 08/03/14 1414  WBC 6.0  NEUTROABS 3.3  HGB 12.5  HCT 38.3  MCV 89.9  PLT 202    Cardiac Enzymes: No results found for this basename: CKTOTAL, CKMB, CKMBINDEX, TROPONINI,  in the last 168 hours  BNP:    Radiology Dg Chest Port 1 View  08/03/2014   CLINICAL DATA:  SOB, bradycardia  EXAM: PORTABLE CHEST - 1 VIEW  COMPARISON:  Prior radiograph from 07/04/2009  FINDINGS: Cardiomegaly is stable as compared to prior study. Mediastinal silhouette unchanged. Prominence along the right peritracheal striate in the medial right lung apex is similar to prior study, likely reflects normal underlying vasculature.  Lungs are hypoinflated with elevation of the right hemidiaphragm, also similar. There is diffuse pulmonary vascular congestion with indistinctness of the interstitial markings without frank pulmonary edema. Small right  pleural effusion present. No pneumothorax. No focal infiltrates identified.  No acute osseus abnormality.  IMPRESSION: 1. Stable cardiomegaly with diffuse pulmonary vascular congestion without overt pulmonary edema. 2. Small right pleural effusion. 3. Stable elevation of the right hemidiaphragm.   Electronically Signed   By: Jeannine Boga M.D.   On: 08/03/2014 15:22    Prior Cardiac Testing/Procedures:   ECG    EKG reveals complete heart block. There is a junctional escape rhythm with a rate of approximately  35-40. Her blood pressures quite stable with this.   Impression and Recommendations     Hypertension     Despite her marked bradycardia, her blood pressure remains elevated. I decided to decrease the dopamine.    AV block, 3rd degree     It is not clear how long this patient has had this rhythm. She appears to be stable with it. I started dopamine at 5 mcg per kilogram per minute to see if her junctional escape rate can be increased. However this did not appear to increase her heart rate. Her systolic blood pressure is significantly elevated. He'll be turning off her dopamine. If her heart rate does not decrease further she'll remain off dopamine.    CHF with unknown LVEF      Earlier in the day in the emergency room there was question of some increasing shortness of breath. Her chest x-ray showed some volume overload. She has had some diuresis. She does not appear to be significantly volume overloaded at this time.    CKD (chronic kidney disease) stage 4, GFR 15-29 ml/min   Overall, the patient is quite stable. External pacing pads are in place but she does not require pacing. There is third degree AV block. The blood pressure is stable. She has had a good response to a small dose of Lasix. We will follow her during the evening and allow her beta blocker to washout.  Daryel November, MD       08/03/2014, 4:48 PM

## 2014-08-03 NOTE — ED Provider Notes (Signed)
CSN: [id number]     Arrival date & time 08/03/14  1343 History   First MD Initiated Contact with Patient 08/03/14 1408     Chief Complaint  Patient presents with  . Bradycardia  . Shortness of Breath     (Consider location/radiation/quality/duration/timing/severity/associated sxs/prior Treatment) HPI Comments: Pt sent from PCP s office with SOB and concern for pna, however, upon arrival pt found to be bradycardic.   Patient is a 80 y.o. female presenting with shortness of breath. The history is provided by the patient. No language interpreter was used.  Shortness of Breath Severity:  Moderate Onset quality:  Gradual Duration:  1 week Timing:  Constant Progression:  Worsening Chronicity:  New Relieved by:  Rest Worsened by:  Exertion and movement Ineffective treatments: mucinex. Associated symptoms: cough, sputum production and wheezing   Associated symptoms: no abdominal pain, no chest pain, no diaphoresis, no fever, no headaches, no neck pain, no sore throat, no syncope and no vomiting     Past Medical History  Diagnosis Date  . Hypertension    History reviewed. No pertinent past surgical history. History reviewed. No pertinent family history. History  Substance Use Topics  . Smoking status: Never Smoker   . Smokeless tobacco: Not on file  . Alcohol Use: No   OB History   Grav Para Term Preterm Abortions TAB SAB Ect Mult Living                 Review of Systems  Constitutional: Negative for fever, chills, diaphoresis, activity change, appetite change and fatigue.  HENT: Negative for congestion, facial swelling, rhinorrhea and sore throat.   Eyes: Negative for photophobia and discharge.  Respiratory: Positive for cough, sputum production, shortness of breath and wheezing. Negative for chest tightness.   Cardiovascular: Negative for chest pain, palpitations, leg swelling and syncope.  Gastrointestinal: Negative for nausea, vomiting, abdominal pain and diarrhea.   Endocrine: Negative for polydipsia and polyuria.  Genitourinary: Negative for dysuria, frequency, difficulty urinating and pelvic pain.  Musculoskeletal: Negative for arthralgias, back pain, neck pain and neck stiffness.  Skin: Negative for color change and wound.  Allergic/Immunologic: Negative for immunocompromised state.  Neurological: Negative for facial asymmetry, weakness, numbness and headaches.  Hematological: Does not bruise/bleed easily.  Psychiatric/Behavioral: Negative for confusion and agitation.      Allergies  Codeine and Sulfa antibiotics  Home Medications   Prior to Admission medications   Medication Sig Start Date End Date Taking? Authorizing Provider  alosetron (LOTRONEX) 1 MG tablet Take 1 mg by mouth 2 (two) times daily.   Yes Historical Provider, MD  ALPRAZolam [name]) 0.5 MG tablet Take 0.5 mg by mouth at bedtime as needed for anxiety.  06/05/14  Yes Historical Provider, MD  aspirin 81 MG tablet Take 81 mg by mouth daily.     Yes Historical Provider, MD  calcium-vitamin D (OSCAL WITH D) 500-200 MG-UNIT per tablet Take 1 tablet by mouth daily.     Yes Historical Provider, MD  carvedilol (COREG) 12.5 MG tablet Take 12.5 mg by mouth 2 (two) times daily with a meal.   Yes Historical Provider, MD  lidocaine (LIDODERM) 5 % Place 1 patch onto the skin daily.  08/01/14  Yes Historical Provider, MD  multivitamin-iron-minerals-folic acid (CENTRUM) chewable tablet Chew 1 tablet by mouth daily.     Yes Historical Provider, MD  OVER THE COUNTER MEDICATION Take 1 tablet by mouth See admin instructions. VID 2 12.5 take twice a month on the 15th and  30th   Yes Historical Provider, MD  valsartan (DIOVAN) 160 MG tablet Take 160 mg by mouth daily.   Yes Historical Provider, MD   BP 129/79  Pulse 38  Temp(Src) 98 F (36.7 C) (Oral)  Resp 23  Ht 5\' 2"  (1.575 m)  Wt 144 lb (65.318 kg)  BMI 26.33 kg/m2  SpO2 99% Physical Exam  Constitutional: She is oriented to person, place,  and time. She appears well-developed and well-nourished. No distress.  HENT:  Head: Normocephalic and atraumatic.  Mouth/Throat: No oropharyngeal exudate.  Eyes: Pupils are equal, round, and reactive to light.  Neck: Normal range of motion. Neck supple.  Cardiovascular: Normal heart sounds.  A regularly irregular rhythm present. Bradycardia present.  Exam reveals no gallop and no friction rub.   No murmur heard. Pulmonary/Chest: Effort normal. No respiratory distress. She has no wheezes. She has rales in the right lower field and the left lower field.  Abdominal: Soft. Bowel sounds are normal. She exhibits no distension and no mass. There is no tenderness. There is no rebound and no guarding.  Musculoskeletal: Normal range of motion. She exhibits no edema and no tenderness.  Neurological: She is alert and oriented to person, place, and time.  Skin: Skin is warm and dry.  Psychiatric: She has a normal mood and affect.    ED Course  Procedures (including critical care time) Labs Review Labs Reviewed  BASIC METABOLIC PANEL - Abnormal; Notable for the following:    Glucose, Bld 140 (*)    BUN 29 (*)    Creatinine, Ser 1.61 (*)    GFR calc non Af Amer 27 (*)    GFR calc Af Amer 31 (*)    All other components within normal limits  PRO B NATRIURETIC PEPTIDE - Abnormal; Notable for the following:    Pro B Natriuretic peptide (BNP) 2601.0 (*)    All other components within normal limits  MRSA PCR SCREENING  URINE CULTURE  CBC WITH DIFFERENTIAL  PROTIME-INR  LIPID PANEL  CBC  CREATININE, SERUM  TSH  MAGNESIUM  TROPONIN I  TROPONIN I  TROPONIN I  CBC  BASIC METABOLIC PANEL  PROTIME-INR  I-STAT TROPOININ, ED    Imaging Review Dg Chest Port 1 View  08/03/2014   CLINICAL DATA:  SOB, bradycardia  EXAM: PORTABLE CHEST - 1 VIEW  COMPARISON:  Prior radiograph from 07/04/2009  FINDINGS: Cardiomegaly is stable as compared to prior study. Mediastinal silhouette unchanged. Prominence  along the right peritracheal striate in the medial right lung apex is similar to prior study, likely reflects normal underlying vasculature.  Lungs are hypoinflated with elevation of the right hemidiaphragm, also similar. There is diffuse pulmonary vascular congestion with indistinctness of the interstitial markings without frank pulmonary edema. Small right pleural effusion present. No pneumothorax. No focal infiltrates identified.  No acute osseus abnormality.  IMPRESSION: 1. Stable cardiomegaly with diffuse pulmonary vascular congestion without overt pulmonary edema. 2. Small right pleural effusion. 3. Stable elevation of the right hemidiaphragm.   Electronically Signed   By: Jeannine Boga M.D.   On: 08/03/2014 15:22     EKG Interpretation   Date/Time:  Thursday August 03 2014 15:06:27 EDT Ventricular Rate:  36 PR Interval:  152 QRS Duration: 94 QT Interval:  552 QTC Calculation: 427 R Axis:   62 Text Interpretation:  Sinus bradycardia Probable left atrial enlargement  2:1 AV block Confirmed by DOCHERTY  MD, MEGAN ([id number]) on 08/03/2014 7:58:32  PM     CRITICAL  CARE Performed by: Ernestina Patches, E Total critical care time: 45 mins Critical care time was exclusive of separately billable procedures and treating other patients. Critical care was necessary to treat or prevent imminent or life-threatening deterioration. Critical care was time spent personally by me on the following activities: development of treatment plan with patient and/or surrogate as well as nursing, discussions with consultants, evaluation of patient's response to treatment, examination of patient, obtaining history from patient or surrogate, ordering and performing treatments and interventions, ordering and review of laboratory studies, ordering and review of radiographic studies, pulse oximetry and re-evaluation of patient's condition.  MDM   Final diagnoses:  AV block, 3rd degree  Acute exacerbation of CHF  (congestive heart failure)    Pt is a 80 y.o. female with Pmhx as above who presents with SOB and bradycardiac from PCP's office. She was sent for SOB and productive cough w/ concern for pna, but upon arrival, pt found to be bradycardic and EKG was concerning for 3rd deg AVB with a likely ventricular escape rate in the 30's.  Pt otherwise hypertensive, but appears in NAD. Repeated EKG c/w 2:1 AV block.  CXR w/ pulmonary edema and cardiomegaly. Trop negative, BNP elevated. Cardiology consulted and would like for pt to be sent to [facility] for eval for possible pacemaker.   4:00 PM On repeat exam, but appears to have developed SOB at rest, and inc crackles at her bases and appears uncomfortable. O2 sats 90-94% on RA.  Have ordered 40mg  IV lasix and will also start trial of BiPAP for CHF likely brought upon by her severe bradycardia.    4:12 PM Updated cardiology who rec also starting trial of low dose dopamine. Will sent to either [facility] or [facility]down at [facility] depending on bed availability. Pt looking improved on BiPAP  5:00 PM Pt has been able to come off BiPAP, WOB much improved. Low dose dopamine does not seem to have affected HR pt is now more hypertensive. Will dec from 5 to 2.5 mcg/kg/hr.         Ernestina Patches, MD 08/03/14 [phone]

## 2014-08-03 NOTE — ED Notes (Signed)
Pt has urinated since lasix started

## 2014-08-03 NOTE — ED Notes (Signed)
EMS at bedside

## 2014-08-03 NOTE — ED Notes (Signed)
Respiratory at bedside.

## 2014-08-03 NOTE — ED Notes (Addendum)
Pt sent here from [place] office for SOB. HR on arrival 44. Pt alert and oriented. Pt put on monitor with crash cart pads.

## 2014-08-04 ENCOUNTER — Inpatient Hospital Stay (HOSPITAL_COMMUNITY): Payer: Medicare Other

## 2014-08-04 ENCOUNTER — Encounter (HOSPITAL_COMMUNITY)
Admission: EM | Disposition: A | Discharge status: Home / Self Care | Payer: Self-pay | Source: Home / Self Care | Attending: Cardiology

## 2014-08-04 DIAGNOSIS — I442 Atrioventricular block, complete: Secondary | ICD-10-CM

## 2014-08-04 DIAGNOSIS — I059 Rheumatic mitral valve disease, unspecified: Secondary | ICD-10-CM

## 2014-08-04 HISTORY — PX: PERMANENT PACEMAKER INSERTION: SHX5480

## 2014-08-04 LAB — CBC
HCT: 36.8 % (ref 36.0–46.0)
Hemoglobin: 12 g/dL (ref 12.0–15.0)
MCH: 29.6 pg (ref 26.0–34.0)
MCHC: 32.6 g/dL (ref 30.0–36.0)
MCV: 90.6 fL (ref 78.0–100.0)
Platelets: 187 10*3/uL (ref 150–400)
RBC: 4.06 MIL/uL (ref 3.87–5.11)
RDW: 13.7 % (ref 11.5–15.5)
WBC: 8 10*3/uL (ref 4.0–10.5)

## 2014-08-04 LAB — BASIC METABOLIC PANEL
Anion gap: 15 (ref 5–15)
BUN: 29 mg/dL — AB (ref 6–23)
CO2: 26 mEq/L (ref 19–32)
CREATININE: 1.49 mg/dL — AB (ref 0.50–1.10)
Calcium: 9.5 mg/dL (ref 8.4–10.5)
Chloride: 108 mEq/L (ref 96–112)
GFR calc non Af Amer: 30 mL/min — ABNORMAL LOW (ref >90)
GFR, EST AFRICAN AMERICAN: 34 mL/min — AB (ref >90)
Glucose, Bld: 136 mg/dL — ABNORMAL HIGH (ref 70–99)
Potassium: 3.9 mEq/L (ref 3.7–5.3)
Sodium: 149 mEq/L — ABNORMAL HIGH (ref 137–147)

## 2014-08-04 LAB — LIPID PANEL
CHOL/HDL RATIO: 4 ratio
CHOLESTEROL: 217 mg/dL — AB (ref 0–200)
HDL: 54 mg/dL (ref >39)
LDL Cholesterol: 145 mg/dL — ABNORMAL HIGH (ref 0–99)
TRIGLYCERIDES: 88 mg/dL (ref <150)
VLDL: 18 mg/dL (ref 0–40)

## 2014-08-04 LAB — TROPONIN I
Troponin I: 0.35 ng/mL (ref <0.30)
Troponin I: <0.3 ng/mL (ref <0.30)

## 2014-08-04 LAB — URINE CULTURE
COLONY COUNT: NO GROWTH
Culture: NO GROWTH
SPECIAL REQUESTS: NORMAL

## 2014-08-04 LAB — PROTIME-INR
INR: 1.17 (ref 0.00–1.49)
Prothrombin Time: 14.9 seconds (ref 11.6–15.2)

## 2014-08-04 SURGERY — PERMANENT PACEMAKER INSERTION
Anesthesia: LOCAL

## 2014-08-04 MED ORDER — FUROSEMIDE 10 MG/ML IJ SOLN
INTRAMUSCULAR | Status: AC
  Filled 2014-08-04: qty 4

## 2014-08-04 MED ORDER — FENTANYL CITRATE 0.05 MG/ML IJ SOLN
INTRAMUSCULAR | Status: AC
  Filled 2014-08-04: qty 2

## 2014-08-04 MED ORDER — SODIUM CHLORIDE 0.9 % IV SOLN
INTRAVENOUS | Status: DC
  Administered 2014-08-04: 11:00:00 via INTRAVENOUS

## 2014-08-04 MED ORDER — SODIUM CHLORIDE 0.9 % IR SOLN
80.0000 mg | Status: DC
  Filled 2014-08-04: qty 2

## 2014-08-04 MED ORDER — ONDANSETRON HCL 4 MG/2ML IJ SOLN
4.0000 mg | Freq: Four times a day (QID) | INTRAMUSCULAR | Status: DC | PRN
Start: 2014-08-04 — End: 2014-08-06

## 2014-08-04 MED ORDER — CEFAZOLIN SODIUM-DEXTROSE 2-3 GM-% IV SOLR
2.0000 g | INTRAVENOUS | Status: DC
  Filled 2014-08-04: qty 50

## 2014-08-04 MED ORDER — CARVEDILOL 12.5 MG PO TABS
12.5000 mg | ORAL_TABLET | Freq: Two times a day (BID) | ORAL | Status: DC
  Administered 2014-08-04: 12.5 mg via ORAL
  Filled 2014-08-04 (×4): qty 1

## 2014-08-04 MED ORDER — SODIUM CHLORIDE 0.9 % IV SOLN
INTRAVENOUS | Status: AC
  Administered 2014-08-04: 18:00:00 via INTRAVENOUS

## 2014-08-04 MED ORDER — CEFAZOLIN SODIUM 1-5 GM-% IV SOLN
1.0000 g | Freq: Two times a day (BID) | INTRAVENOUS | Status: AC
  Administered 2014-08-05 (×2): 1 g via INTRAVENOUS
  Filled 2014-08-04 (×2): qty 50

## 2014-08-04 MED ORDER — CHLORHEXIDINE GLUCONATE 4 % EX LIQD: 60.0000 mL | Freq: Once | CUTANEOUS | Status: DC

## 2014-08-04 MED ORDER — ACETAMINOPHEN 325 MG PO TABS
325.0000 mg | ORAL_TABLET | ORAL | Status: DC | PRN
  Administered 2014-08-05: 650 mg via ORAL
  Filled 2014-08-04: qty 2

## 2014-08-04 MED ORDER — MIDAZOLAM HCL 5 MG/5ML IJ SOLN
INTRAMUSCULAR | Status: AC
  Filled 2014-08-04: qty 5

## 2014-08-04 MED ORDER — IRBESARTAN 75 MG PO TABS
75.0000 mg | ORAL_TABLET | Freq: Every day | ORAL | Status: DC
  Administered 2014-08-04 – 2014-08-06 (×3): 75 mg via ORAL
  Filled 2014-08-04 (×3): qty 1

## 2014-08-04 MED ORDER — CEFAZOLIN SODIUM 1-5 GM-% IV SOLN: 1.0000 g | Freq: Four times a day (QID) | INTRAVENOUS | Status: DC

## 2014-08-04 MED ORDER — SODIUM CHLORIDE 0.9 % IV SOLN
INTRAVENOUS | Status: DC
  Administered 2014-08-04: 10:00:00 via INTRAVENOUS

## 2014-08-04 MED ORDER — CHLORHEXIDINE GLUCONATE 4 % EX LIQD
60.0000 mL | Freq: Once | CUTANEOUS | Status: AC
  Administered 2014-08-04: 4 via TOPICAL
  Filled 2014-08-04: qty 15

## 2014-08-04 NOTE — CV Procedure (Signed)
VENESIA CHREST [id number]  [id number]  Preop Dx: highgread heart block 3:1  Postop Dx same/ CHF  Procedure: dual chamber pacemaker insertion  After routine prep and drape, lidocaine was infiltrated in the prepectoral subclavicular region on the left side an incision was made and carried down to layer of  the prepectoral fascia using electrocautery and sharp dissection;  a pocket was formed similarly. Hemostasis was obtained.  After this, we turned our attention to gaining access to the extrathoracic,left subclavian vein. This was accomplished without difficulty and without the aspiration of air or puncture of the artery. 2 separate venipunctures were accomplished; guidewires were placed and retained and sequentially 7 French sheaths  were placed through which were passed a  St Jude  ventricular lead, serial number [id number] and a   St Jude [id number] atrial lead, serial number [id number].  The ventricular lead was manipulated to the right ventricular apex where the bipolar R wave was 10, the pacing impedance was 683, the threshold was  <1 V  @ 0.5 msec .  Current at threshold was   <1.4 ma.  The right atrial lead was manipulated to the right atrial appendage where the bipolar P-wave was 3.1, the pacing impedance was 621, the threshold was 0.8 V @ 0.5 msec.  Current at threshold was ma 1.2  The leads were affixed to the prepectoral fascia and attached to a  Medtronic  pulse generator.  Serial number [id number] H SENSIA  Hemostasis was obtained. The pocket was copiously irrigated with antibiotic containing saline solution. The leads and the pulse generator were placed in the pocket and affixed to the prepectoral fascia. The wound iwas then closed in  2 layers in normal fashion. The wound was washed dried and a dERMABOND dressing  was applied.  Needle count, sponge count  and instrument counts were correct at the end of the procedure .Marland Kitchen The patient tolerated the procedure without apparent  complication.  EBL MINIMAL  Deboraha Sprang M.D. 08/04/2014 4:32 PM

## 2014-08-04 NOTE — Consult Note (Addendum)
ELECTROPHYSIOLOGY CONSULT NOTE  Patient ID: Victoria Lloyd, MRN: [id number], DOB/AGE: Feb 24, 1922 80 y.o. Admit date: 08/03/2014 Date of Consult: 08/04/2014  Primary Physician: No primary provider on file. Primary Cardiologist: new  Chief Complaint:  2:1 AV block   HPI Victoria Lloyd is a 80 y.o. female  Retired Scientist, physiological of women at [employer] who presented to her primary physician, Dr. Jeanann Lewandowsky, yesterday with complaints of cough and sleepiness both for about 2 weeks. She was noted to be bradycardic. She was referred to the hospital.  She is a history of hypertension for which she was taking carvedilol. This was discontinued, her last dose being the morning of 9/10.  Dopamine was administered but no impact in heart rate but an increase in blood pressure; it was discontinued.  The patient has no syncope.  She does recollect her PCP have mentioned in the past that she had a slow heart rate. Further data are not available.  She has no known cardiac history.  As noted above, she has some sleepiness and cough or couple weeks; there may have been some concomitant exercise intolerance. There was no chest discomfort.    Past Medical History  Diagnosis Date  . Hypertension       Surgical History: History reviewed. No pertinent past surgical history.   Home Meds: Prior to Admission medications   Medication Sig Start Date End Date Taking? Authorizing Provider  alosetron (LOTRONEX) 1 MG tablet Take 1 mg by mouth 2 (two) times daily.   Yes Historical Provider, MD  ALPRAZolam [name]) 0.5 MG tablet Take 0.5 mg by mouth at bedtime as needed for anxiety.  06/05/14  Yes Historical Provider, MD  aspirin 81 MG tablet Take 81 mg by mouth daily.     Yes Historical Provider, MD  calcium-vitamin D (OSCAL WITH D) 500-200 MG-UNIT per tablet Take 1 tablet by mouth daily.     Yes Historical Provider, MD  carvedilol (COREG) 12.5 MG tablet Take 12.5 mg by mouth 2 (two) times daily  with a meal.   Yes Historical Provider, MD  lidocaine (LIDODERM) 5 % Place 1 patch onto the skin daily.  08/01/14  Yes Historical Provider, MD  multivitamin-iron-minerals-folic acid (CENTRUM) chewable tablet Chew 1 tablet by mouth daily.     Yes Historical Provider, MD  OVER THE COUNTER MEDICATION Take 1 tablet by mouth See admin instructions. VID 2 12.5 take twice a month on the 15th and 30th   Yes Historical Provider, MD  valsartan (DIOVAN) 160 MG tablet Take 160 mg by mouth daily.   Yes Historical Provider, MD    Inpatient Medications:  . ALPRAZolam  0.5 mg Oral Once  . antiseptic oral rinse  7 mL Mouth Rinse BID  . aspirin  324 mg Oral NOW   Or  . aspirin  300 mg Rectal NOW  . aspirin EC  81 mg Oral Daily  .  ceFAZolin (ANCEF) IV  2 g Intravenous On Call  . chlorhexidine  60 mL Topical Once  . chlorhexidine  60 mL Topical Once  . enoxaparin (LOVENOX) injection  30 mg Subcutaneous Q24H  . gentamicin irrigation  80 mg Irrigation On Call  . sodium chloride  3 mL Intravenous Q12H    Allergies:  Allergies  Allergen Reactions  . Codeine     unknown  . Sulfa Antibiotics     unknown    History   Social History  . Marital Status: Widowed  Spouse Name: N/A    Number of Children: N/A  . Years of Education: N/A   Occupational History  . Not on file.   Social History Main Topics  . Smoking status: Never Smoker   . Smokeless tobacco: Not on file  . Alcohol Use: No  . Drug Use: No  . Sexual Activity: Not on file   Other Topics Concern  . Not on file   Social History Narrative  . No narrative on file     History reviewed. No pertinent family history.   ROS:  Please see the history of present illness.     All other systems reviewed and negative.    Physical Exam: Blood pressure 171/44, pulse 35, temperature 99.2 F (37.3 C), temperature source Oral, resp. rate 26, height 5\' 2"  (1.575 m), weight 145 lb 11.6 oz (66.1 kg), SpO2 96.00%. General: Well developed, well  nourished female in no acute distress. Head: Normocephalic, atraumatic, sclera non-icteric, no xanthomas, nares are without discharge. EENT: normal Lymph Nodes:  none Back: without scoliosis/kyphosis, no CVA tendersness Neck: Negative for carotid bruits. JVD not elevated. Lungs: Clear bilaterally to auscultation without wheezes, rales, or rhonchi. Breathing is unlabored. Heart: RRR with S1 S2.  2/6 systolic murmur , rubs, or gallops appreciated. Abdomen: Soft, non-tender, non-distended with normoactive bowel sounds. No hepatomegaly. No rebound/guarding. No obvious abdominal masses. Msk:  Strength and tone appear normal for age. Extremities: No clubbing or cyanosis. No  edema.  Distal pedal pulses are 2+ and equal bilaterally. Skin: Warm and Dry Neuro: Alert and oriented X 3. CN III-XII intact Grossly normal sensory and motor function . Psych:  Responds to questions appropriately with a normal affect.      Labs: Cardiac Enzymes  Recent Labs  08/03/14 2010 08/04/14 0024  TROPONINI <0.30 0.35*   CBC Lab Results  Component Value Date   WBC 8.0 08/04/2014   HGB 12.0 08/04/2014   HCT 36.8 08/04/2014   MCV 90.6 08/04/2014   PLT 187 08/04/2014   PROTIME:  Recent Labs  08/03/14 2010 08/04/14 0024  LABPROT 15.2 14.9  INR 1.20 1.17   Chemistry  Recent Labs Lab 08/04/14 0024  NA 149*  K 3.9  CL 108  CO2 26  BUN 29*  CREATININE 1.49*  CALCIUM 9.5  GLUCOSE 136*   Lipids Lab Results  Component Value Date   CHOL 217* 08/04/2014   HDL 54 08/04/2014   LDLCALC 145* 08/04/2014   TRIG 88 08/04/2014   BNP Pro B Natriuretic peptide (BNP)  Date/Time Value Ref Range Status  08/03/2014  2:15 PM 2601.0* 0 - 450 pg/mL Final   Miscellaneous No results found for this basename: DDIMER    Radiology/Studies:  Dg Chest Port 1 View  08/03/2014   CLINICAL DATA:  SOB, bradycardia  EXAM: PORTABLE CHEST - 1 VIEW  COMPARISON:  Prior radiograph from 07/04/2009  FINDINGS: Cardiomegaly is  stable as compared to prior study. Mediastinal silhouette unchanged. Prominence along the right peritracheal striate in the medial right lung apex is similar to prior study, likely reflects normal underlying vasculature.  Lungs are hypoinflated with elevation of the right hemidiaphragm, also similar. There is diffuse pulmonary vascular congestion with indistinctness of the interstitial markings without frank pulmonary edema. Small right pleural effusion present. No pneumothorax. No focal infiltrates identified.  No acute osseus abnormality.  IMPRESSION: 1. Stable cardiomegaly with diffuse pulmonary vascular congestion without overt pulmonary edema. 2. Small right pleural effusion. 3. Stable elevation of the right hemidiaphragm.  Electronically Signed   By: Jeannine Boga M.D.   On: 08/03/2014 15:22    EKG: Sinus rhythm at 77 Narrow QRS 2:1 AV block  EMS tracings demonstrated 1 tracing where there were 2 consecutively nonconducted P wave                                                                                                                                         Assessment and Plan:  2:1 AV block  High-grade heart block  Fatigue and cough likely related to #1  Hypertension treated with carvedilol.  The patient has 2:1 block. There may be a history of antecedent bradycardia  Carvedilol could potentially be implicated as a culprit; its half-life is approximately 6 hours. She from her system early this afternoon.  In the event that her heart block continues, I would recommend proceeding with pacing as it is likely that the new onset symptoms are consequence with manifestations of heart failure and fatigue. I will review this later today with the patient and her son, a physician.  Echocardiogram 2003 demonstrated normal LV function; a repeat echo has been ordered  Virl Axe

## 2014-08-04 NOTE — Progress Notes (Signed)
CRITICAL VALUE ALERT  Critical value received:  Trop .35  Date of notification:  08/04/2014  Time of notification:  0130  Critical value read back:Yes.    Nurse who received alert:  Martinique Perkins  MD notified (1st page):  Dr. Luiz Ochoa  Time of first page:  0135  Responding MD:  Luiz Ochoa   Time MD responded:  [phone]

## 2014-08-04 NOTE — Progress Notes (Signed)
EKG CRITICAL VALUE     12 lead EKG performed.  Critical value noted.  Martinique Perkins, RN notified.   Laurell Josephs, [place] 08/04/2014 7:05 AM

## 2014-08-04 NOTE — Progress Notes (Signed)
Echocardiogram 2D Echocardiogram has been performed.  Victoria Lloyd 08/04/2014, 12:04 PM

## 2014-08-04 NOTE — Care Management Note (Signed)
    Page 1 of 1   08/04/2014     8:11:46 AM CARE MANAGEMENT NOTE 08/04/2014  Patient:  Victoria Lloyd, Victoria Lloyd   Account Number:  [id number]  Date Initiated:  08/04/2014  Documentation initiated by:  Elissa Hefty  Subjective/Objective Assessment:   adm w av block     Action/Plan:   lives alone   Anticipated DC Date:     Anticipated DC Plan:           Choice offered to / List presented to:             Status of service:   Medicare Important Message given?   (If response is "NO", the following Medicare IM given date fields will be blank) Date Medicare IM given:   Medicare IM given by:   Date Additional Medicare IM given:   Additional Medicare IM given by:    Discharge Disposition:    Per UR Regulation:  Reviewed for med. necessity/level of care/duration of stay  If discussed at [place] of Stay Meetings, dates discussed:    Comments:

## 2014-08-05 ENCOUNTER — Inpatient Hospital Stay (HOSPITAL_COMMUNITY): Payer: Medicare Other

## 2014-08-05 LAB — GLUCOSE, CAPILLARY: Glucose-Capillary: 162 mg/dL — ABNORMAL HIGH (ref 70–99)

## 2014-08-05 MED ORDER — CARVEDILOL 6.25 MG PO TABS
18.7500 mg | ORAL_TABLET | Freq: Two times a day (BID) | ORAL | Status: DC
  Administered 2014-08-05 – 2014-08-06 (×3): 18.75 mg via ORAL
  Filled 2014-08-05 (×5): qty 1

## 2014-08-05 NOTE — Progress Notes (Signed)
ELECTROPHYSIOLOGY ROUNDING NOTE    Patient Name: Victoria Lloyd Date of Encounter: 08/05/2014    SUBJECTIVE:Patient feels well this morning.  No chest pain or shortness of breath.   Admitted 08-03-14 with CHB, Coreg allowed to wash out without improvement in conduction.  S/p dual chamber pacemaker implant 08-04-14  TELEMETRY: Reviewed telemetry pt in sinus rhythm with ventricular pacing Filed Vitals:   08/05/14 0300 08/05/14 0400 08/05/14 0405 08/05/14 0500  BP:  172/63 148/54 169/82  Pulse:  69 72 73  Temp: 99.5 F (37.5 C)     TempSrc: Oral     Resp:  19 20 12   Height:      Weight: 142 lb 6.7 oz (64.6 kg)     SpO2:  94% 94% 94%    Intake/Output Summary (Last 24 hours) at 08/05/14 0659 Last data filed at 08/05/14 0400  Gross per 24 hour  Intake 762.36 ml  Output   2510 ml  Net -1747.64 ml    CURRENT MEDICATIONS: . ALPRAZolam  0.5 mg Oral Once  . antiseptic oral rinse  7 mL Mouth Rinse BID  . aspirin EC  81 mg Oral Daily  . carvedilol  12.5 mg Oral BID WC  .  ceFAZolin (ANCEF) IV  1 g Intravenous Q12H  . enoxaparin (LOVENOX) injection  30 mg Subcutaneous Q24H  . irbesartan  75 mg Oral Daily  . sodium chloride  3 mL Intravenous Q12H    LABS: Basic Metabolic Panel:  Recent Labs  08/03/14 1414 08/03/14 2010 08/04/14 0024  NA 145  --  149*  K 4.4  --  3.9  CL 109  --  108  CO2 23  --  26  GLUCOSE 140*  --  136*  BUN 29*  --  29*  CREATININE 1.61* 1.55* 1.49*  CALCIUM 9.5  --  9.5  MG  --  2.2  --    CBC:  Recent Labs  08/03/14 1414 08/03/14 2010 08/04/14 0024  WBC 6.0 6.9 8.0  NEUTROABS 3.3  --   --   HGB 12.5 13.1 12.0  HCT 38.3 40.5 36.8  MCV 89.9 90.0 90.6  PLT 202 210 187   Cardiac Enzymes:  Recent Labs  08/03/14 2010 08/04/14 0024 08/04/14 0832  TROPONINI <0.30 0.35* <0.30   Fasting Lipid Panel:  Recent Labs  08/04/14 0024  CHOL 217*  HDL 54  LDLCALC 145*  TRIG 88  CHOLHDL 4.0   Thyroid Function Tests:  Recent  Labs  08/03/14 2010  TSH 1.840    Radiology/Studies:  Dg Chest Portable 1 View 08/04/2014   CLINICAL DATA:  Status post implantation of. Cardiac pacer. Evaluate for pneumothorax.  EXAM: PORTABLE CHEST - 1 VIEW  COMPARISON:  08/03/2014  FINDINGS: Left chest wall pacemaker with dual leads projecting over the expected locations of the right atrium and right ventricle. Cardiomegaly appears stable. Pulmonary vascularity appears upper normal, and decreased compared to yesterday's chest radiograph. No evidence of edema or consolidation. Slight atelectasis at the right lung base. Negative for pneumothorax.  IMPRESSION: Negative for pneumothorax, status post placement of a left chest wall dual lead pacemaker. Pulmonary vascular congestion appears to be decreasing since yesterday chest radiograph.   Electronically Signed   By: Curlene Dolphin M.D.   On: 08/04/2014 18:10   Final result pending, leads in stable position.  PHYSICAL EXAM Well appearing elderly woman, NAD HEENT: Unremarkable,[place], AT Neck:  6 JVD, no thyromegally Back:  No CVA tenderness Lungs:  Clear  with no wheezes, rales, or rhonchi HEART:  Regular rate rhythm, no murmurs, no rubs, no clicks Abd:  soft, positive bowel sounds, no organomegally, no rebound, no guarding Ext:  2 plus pulses, no edema, no cyanosis, no clubbing Skin:  No rashes no nodules Neuro:  CN II through XII intact, motor grossly intact   DEVICE INTERROGATION: Device interrogated by industry.  Lead values including impedence, sensing, threshold within normal values.    Active Problems:   Hypertension   AV block, 3rd degree   CHF with unknown LVEF   CKD (chronic kidney disease) stage 4, GFR 15-29 ml/min   Bradycardia   Plan - will restart her beta blocker as her blood pressure is elevated. Will transfer to a tele bed and plan to discharge home tomorrow.   Mikle Bosworth.D

## 2014-08-06 DIAGNOSIS — I509 Heart failure, unspecified: Secondary | ICD-10-CM

## 2014-08-06 DIAGNOSIS — Z950 Presence of cardiac pacemaker: Secondary | ICD-10-CM | POA: Diagnosis not present

## 2014-08-06 DIAGNOSIS — N184 Chronic kidney disease, stage 4 (severe): Secondary | ICD-10-CM

## 2014-08-06 DIAGNOSIS — I10 Essential (primary) hypertension: Secondary | ICD-10-CM

## 2014-08-06 LAB — BASIC METABOLIC PANEL
ANION GAP: 13 (ref 5–15)
BUN: 31 mg/dL — AB (ref 6–23)
CO2: 26 meq/L (ref 19–32)
CREATININE: 1.25 mg/dL — AB (ref 0.50–1.10)
Calcium: 9.4 mg/dL (ref 8.4–10.5)
Chloride: 102 mEq/L (ref 96–112)
GFR calc non Af Amer: 37 mL/min — ABNORMAL LOW (ref >90)
GFR, EST AFRICAN AMERICAN: 43 mL/min — AB (ref >90)
Glucose, Bld: 128 mg/dL — ABNORMAL HIGH (ref 70–99)
POTASSIUM: 3.6 meq/L — AB (ref 3.7–5.3)
Sodium: 141 mEq/L (ref 137–147)

## 2014-08-06 MED ORDER — ACETAMINOPHEN 325 MG PO TABS: 325.0000 mg | ORAL_TABLET | ORAL | Status: AC | PRN

## 2014-08-06 NOTE — Discharge Summary (Addendum)
Patient ID: Victoria Lloyd,  MRN: [id number], DOB/AGE: 1922-02-09 80 y.o.  Admit date: 08/03/2014 Discharge date: 08/06/2014  Primary Care Provider: Dr Jeanann Lewandowsky Primary Cardiologist: Dr Caryl Comes  Discharge Diagnoses Principal Problem:   AV block, 3rd degree Active Problems:   Bradycardia   Pacemaker- St Jude impalnted [id number]   Acute diastolic CHF- secondary to bradycardia on admision (Nl LVF by echo)   CKD (chronic kidney disease) stage 4, GFR 15-29 ml/min   Hypertension   Procedures: St Jude PTVDP 08/04/14   Hospital Course:  Pt is ais a 80 y.o. 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 She has a history of hypertension for which she was taking carvedilol. She has no known cardiac history. She presented to her primary physician, Dr. Jeanann Lewandowsky, 08/03/14 with complaints of cough and sleepiness for about 2 weeks. The patient had no history of syncope. She was noted to be significantly bradycardic and she was referred to the ER at [facility]. In the ER she was noted be in high grade AVB with a HR of 33. She was amitted to step down unit. She was found to have acute diastolic CHF secondary to heart block and was given Lasix IV once in there ER, (BNP 2601) and diuresed 2.9L. Her 2nd Troponin was slightly elevated at 0.35, 3d Troponin was negative. Her SCr was elevated on admission-1.55. This came down to 1.25 at discharge. Her Coreg was discontinued, her last dose being the morning of 9/10. Diovan was continued. Dopamine was administered but it had no impact in heart rate but caused an increase in blood pressure and was discontinued. She underwent implantation of a St Jude pacemaker [id number] without complications. Her B/P was elevated on 08/05/14 and it was decided to keep her another 24 hrs. Coreg was resumed. She is stable for discharge 08/06/14. She'll be contacted for follow up in the device clinic.      Discharge Vitals:  Blood pressure 159/68, pulse 75, temperature  98.1 F (36.7 C), temperature source Oral, resp. rate 16, height 5\' 2"  (1.575 m), weight 142 lb 8 oz (64.638 kg), SpO2 94.00%.    Labs: Results for orders placed during the hospital encounter of 08/03/14 (from the past 24 hour(s))  BASIC METABOLIC PANEL     Status: Abnormal   Collection Time    08/06/14  7:45 AM      Result Value Ref Range   Sodium 141  137 - 147 mEq/L   Potassium 3.6 (*) 3.7 - 5.3 mEq/L   Chloride 102  96 - 112 mEq/L   CO2 26  19 - 32 mEq/L   Glucose, Bld 128 (*) 70 - 99 mg/dL   BUN 31 (*) 6 - 23 mg/dL   Creatinine, Ser 1.25 (*) 0.50 - 1.10 mg/dL   Calcium 9.4  8.4 - 10.5 mg/dL   GFR calc non Af Amer 37 (*) >90 mL/min   GFR calc Af Amer 43 (*) >90 mL/min   Anion gap 13  5 - 15    Disposition:      Follow-up Information   Follow up with Virl Axe, MD. (office will contact you for follow up in one week for site check)    Specialty:  Cardiology   Contact information:   [address] [address] [phone]       Discharge Medications:    Medication List         acetaminophen  325 MG tablet  Commonly known as:  TYLENOL  Take 1-2 tablets (325-650 mg total) by mouth every 4 (four) hours as needed for mild pain.     alosetron 1 MG tablet  Commonly known as:  LOTRONEX  Take 1 mg by mouth 2 (two) times daily.     ALPRAZolam 0.5 MG tablet  Commonly known as:  XANAX  Take 0.5 mg by mouth at bedtime as needed for anxiety.     aspirin 81 MG tablet  Take 81 mg by mouth daily.     calcium-vitamin D 500-200 MG-UNIT per tablet  Commonly known as:  OSCAL WITH D  Take 1 tablet by mouth daily.     carvedilol 12.5 MG tablet  Commonly known as:  COREG  Take 12.5 mg by mouth 2 (two) times daily with a meal.     lidocaine 5 %  Commonly known as:  LIDODERM  Place 1 patch onto the skin daily.     multivitamin-iron-minerals-folic acid chewable tablet  Chew 1 tablet by mouth daily.     OVER THE COUNTER MEDICATION  Take 1  tablet by mouth See admin instructions. VID 2 12.5 take twice a month on the 15th and 30th     valsartan 160 MG tablet  Commonly known as:  DIOVAN  Take 160 mg by mouth daily.         Duration of Discharge Encounter: Greater than 30 minutes including physician time.  Angelena Form PA-C 08/06/2014 10:29 AM  Agree with above discharge summary with some minor changes  Fransico Him, MD [facility]

## 2014-08-06 NOTE — Discharge Summary (Signed)
Agree with D/C summary as outlined by Kerin Ransom PA-C

## 2014-08-06 NOTE — Progress Notes (Signed)
Went over discharge instructions with the patient and her son. Went over pacemaker discharge instructions including education on arm movement post pacemaker placement, using teach back method. Patient and son expressed understanding of the discharge instructions and had no additional questions or concerns. IV discontinued, patient taken off the cardiac monitor. Patient discharged home .Elmarie Shiley R

## 2014-08-06 NOTE — Progress Notes (Addendum)
SUBJECTIVE:  No complaints  OBJECTIVE:   Vitals:   Filed Vitals:   08/05/14 0841 08/05/14 1327 08/05/14 1951 08/06/14 0503  BP:  114/82 130/39 159/68  Pulse:  66 68 75  Temp: 98.4 F (36.9 C) 97.8 F (36.6 C) 98.1 F (36.7 C) 98.1 F (36.7 C)  TempSrc: Oral Oral Oral Oral  Resp:  20 20 16   Height:      Weight:    142 lb 8 oz (64.638 kg)  SpO2:  99% 96% 94%   I&O's:   Intake/Output Summary (Last 24 hours) at 08/06/14 [id number] Last data filed at 08/05/14 2100  Gross per 24 hour  Intake    840 ml  Output      0 ml  Net    840 ml   TELEMETRY: Reviewed telemetry pt in NSR with Vpaced rhythm:     PHYSICAL EXAM General: Well developed, well nourished, in no acute distress Head: Eyes PERRLA, No xanthomas.   Normal cephalic and atramatic  Lungs:   Clear bilaterally to auscultation and percussion. Heart:   HRRR S1 S2 Pulses are 2+ & equal. Abdomen: Bowel sounds are positive, abdomen soft and non-tender without masses  Extremities:   No clubbing, cyanosis or edema.  DP +1 Neuro: Alert and oriented X 3. Psych:  Good affect, responds appropriately Pacer site clean and dry.  No hematoma  LABS: Basic Metabolic Panel:  Recent Labs  08/03/14 1414 08/03/14 2010 08/04/14 0024 08/06/14 0745  NA 145  --  149* 141  K 4.4  --  3.9 3.6*  CL 109  --  108 102  CO2 23  --  26 26  GLUCOSE 140*  --  136* 128*  BUN 29*  --  29* 31*  CREATININE 1.61* 1.55* 1.49* 1.25*  CALCIUM 9.5  --  9.5 9.4  MG  --  2.2  --   --    Liver Function Tests: No results found for this basename: AST, ALT, ALKPHOS, BILITOT, PROT, ALBUMIN,  in the last 72 hours No results found for this basename: LIPASE, AMYLASE,  in the last 72 hours CBC:  Recent Labs  08/03/14 1414 08/03/14 2010 08/04/14 0024  WBC 6.0 6.9 8.0  NEUTROABS 3.3  --   --   HGB 12.5 13.1 12.0  HCT 38.3 40.5 36.8  MCV 89.9 90.0 90.6  PLT 202 210 187   Cardiac Enzymes:  Recent Labs  08/03/14 2010 08/04/14 0024 08/04/14 0832    TROPONINI <0.30 0.35* <0.30   BNP: No components found with this basename: POCBNP,  D-Dimer: No results found for this basename: DDIMER,  in the last 72 hours Hemoglobin A1C: No results found for this basename: HGBA1C,  in the last 72 hours Fasting Lipid Panel:  Recent Labs  08/04/14 0024  CHOL 217*  HDL 54  LDLCALC 145*  TRIG 88  CHOLHDL 4.0   Thyroid Function Tests:  Recent Labs  08/03/14 2010  TSH 1.840   Anemia Panel: No results found for this basename: VITAMINB12, FOLATE, FERRITIN, TIBC, IRON, RETICCTPCT,  in the last 72 hours Coag Panel:   Lab Results  Component Value Date   INR 1.17 08/04/2014   INR 1.20 08/03/2014   INR 1.09 09/06/2011    RADIOLOGY: Dg Chest 2 View  08/05/2014   CLINICAL DATA:  Status post pacemaker implant  EXAM: CHEST  2 VIEW  COMPARISON:  08/04/2014  FINDINGS: Significant elevation of right diaphragm similar to prior study. Numerous calcified right hilar and  mediastinal lymph nodes. Heart size and vascular pattern normal. Uncoiling of the aorta. Dual lead cardiac pacer with generator over left thorax. No pneumothorax or consolidation.  IMPRESSION: No significant change from prior study.   Electronically Signed   By: Skipper Cliche M.D.   On: 08/05/2014 08:09   Dg Chest Portable 1 View  08/04/2014   CLINICAL DATA:  Status post implantation of. Cardiac pacer. Evaluate for pneumothorax.  EXAM: PORTABLE CHEST - 1 VIEW  COMPARISON:  08/03/2014  FINDINGS: Left chest wall pacemaker with dual leads projecting over the expected locations of the right atrium and right ventricle. Cardiomegaly appears stable. Pulmonary vascularity appears upper normal, and decreased compared to yesterday's chest radiograph. No evidence of edema or consolidation. Slight atelectasis at the right lung base. Negative for pneumothorax.  IMPRESSION: Negative for pneumothorax, status post placement of a left chest wall dual lead pacemaker. Pulmonary vascular congestion appears to be  decreasing since yesterday chest radiograph.   Electronically Signed   By: Curlene Dolphin M.D.   On: 08/04/2014 18:10   Dg Chest Port 1 View  08/03/2014   CLINICAL DATA:  SOB, bradycardia  EXAM: PORTABLE CHEST - 1 VIEW  COMPARISON:  Prior radiograph from 07/04/2009  FINDINGS: Cardiomegaly is stable as compared to prior study. Mediastinal silhouette unchanged. Prominence along the right peritracheal striate in the medial right lung apex is similar to prior study, likely reflects normal underlying vasculature.  Lungs are hypoinflated with elevation of the right hemidiaphragm, also similar. There is diffuse pulmonary vascular congestion with indistinctness of the interstitial markings without frank pulmonary edema. Small right pleural effusion present. No pneumothorax. No focal infiltrates identified.  No acute osseus abnormality.  IMPRESSION: 1. Stable cardiomegaly with diffuse pulmonary vascular congestion without overt pulmonary edema. 2. Small right pleural effusion. 3. Stable elevation of the right hemidiaphragm.   Electronically Signed   By: Jeannine Boga M.D.   On: 08/03/2014 15:22   ASSESSMENT/PLAN: Active Problems:  Hypertension - improved after BB restarted AV block, 3rd degree s/p dual chamber PPM Acute diastolic CHF  related to 3rd degree AV block in setting of CKD - normal EF on echo and lungs clear on exam today CKD (chronic kidney disease) stage 4, GFR 15-29 ml/min  Bradycardia  Very mild AS Mild to moderate MR  Plan - patient is stable for discharge home today.  Will follow for wound check in 7-10 days.      Sueanne Margarita, MD  08/06/2014  9:17 AM

## 2014-08-06 NOTE — Discharge Instructions (Signed)

## 2014-08-16 ENCOUNTER — Ambulatory Visit (INDEPENDENT_AMBULATORY_CARE_PROVIDER_SITE_OTHER): Payer: PRIVATE HEALTH INSURANCE | Admitting: *Deleted

## 2014-08-16 DIAGNOSIS — I442 Atrioventricular block, complete: Secondary | ICD-10-CM

## 2014-08-16 LAB — MDC_IDC_ENUM_SESS_TYPE_INCLINIC
Battery Remaining Longevity: 111 mo
Battery Voltage: 2.8 V
Brady Statistic AS VP Percent: 60 %
Brady Statistic AS VS Percent: 0 %
Lead Channel Impedance Value: 707 Ohm
Lead Channel Pacing Threshold Amplitude: 0.5 V
Lead Channel Pacing Threshold Amplitude: 0.5 V
Lead Channel Pacing Threshold Pulse Width: 0.4 ms
Lead Channel Sensing Intrinsic Amplitude: 11.2 mV
Lead Channel Setting Pacing Amplitude: 3.5 V
Lead Channel Setting Pacing Pulse Width: 0.4 ms
MDC IDC MSMT BATTERY IMPEDANCE: 100 Ohm
MDC IDC MSMT LEADCHNL RA IMPEDANCE VALUE: 584 Ohm
MDC IDC MSMT LEADCHNL RA PACING THRESHOLD PULSEWIDTH: 0.4 ms
MDC IDC MSMT LEADCHNL RA SENSING INTR AMPL: 4 mV
MDC IDC SESS DTM: 20150923155539
MDC IDC SET LEADCHNL RA PACING AMPLITUDE: 3.5 V
MDC IDC SET LEADCHNL RV SENSING SENSITIVITY: 2.8 mV
MDC IDC STAT BRADY AP VP PERCENT: 40 %
MDC IDC STAT BRADY AP VS PERCENT: 0 %

## 2014-08-16 NOTE — Progress Notes (Signed)

## 2014-08-30 ENCOUNTER — Encounter: Payer: Self-pay | Admitting: Internal Medicine

## 2014-11-02 ENCOUNTER — Encounter (HOSPITAL_COMMUNITY): Payer: Self-pay | Admitting: Internal Medicine

## 2014-11-09 ENCOUNTER — Encounter: Payer: Self-pay | Admitting: *Deleted

## 2014-11-14 ENCOUNTER — Ambulatory Visit (INDEPENDENT_AMBULATORY_CARE_PROVIDER_SITE_OTHER): Payer: Medicare Other | Admitting: Internal Medicine

## 2014-11-14 ENCOUNTER — Encounter: Payer: Self-pay | Admitting: Internal Medicine

## 2014-11-14 VITALS — BP 130/62 | HR 60 | Ht 62.5 in | Wt 147.2 lb

## 2014-11-14 DIAGNOSIS — I442 Atrioventricular block, complete: Secondary | ICD-10-CM

## 2014-11-14 DIAGNOSIS — R001 Bradycardia, unspecified: Secondary | ICD-10-CM

## 2014-11-14 DIAGNOSIS — Z45018 Encounter for adjustment and management of other part of cardiac pacemaker: Secondary | ICD-10-CM

## 2014-11-14 LAB — MDC_IDC_ENUM_SESS_TYPE_INCLINIC
Battery Impedance: 100 Ohm
Battery Voltage: 2.8 V
Brady Statistic AP VP Percent: 42 %
Brady Statistic AP VS Percent: 2 %
Brady Statistic AS VP Percent: 57 %
Brady Statistic AS VS Percent: 0 %
Date Time Interrogation Session: 20151222131708
Lead Channel Impedance Value: 733 Ohm
Lead Channel Pacing Threshold Amplitude: 0.5 V
Lead Channel Pacing Threshold Amplitude: 0.75 V
Lead Channel Pacing Threshold Pulse Width: 0.4 ms
Lead Channel Sensing Intrinsic Amplitude: 15.67 mV
Lead Channel Setting Pacing Amplitude: 2 V
MDC IDC MSMT BATTERY REMAINING LONGEVITY: 139 mo
MDC IDC MSMT LEADCHNL RA IMPEDANCE VALUE: 563 Ohm
MDC IDC MSMT LEADCHNL RA SENSING INTR AMPL: 4 mV
MDC IDC MSMT LEADCHNL RV PACING THRESHOLD PULSEWIDTH: 0.4 ms
MDC IDC SET LEADCHNL RV PACING AMPLITUDE: 2.5 V
MDC IDC SET LEADCHNL RV PACING PULSEWIDTH: 0.4 ms
MDC IDC SET LEADCHNL RV SENSING SENSITIVITY: 4 mV

## 2014-11-14 NOTE — Progress Notes (Signed)
      No care team member to display   HPI  Victoria Lloyd is a 80 y.o. female Seen in follow-up for a pacemaker implanted 9/15 for 2-1 heart block.  She has a history of hypertension.  She has been doing well   Past Medical History  Diagnosis Date  . Hypertension   . 2Nd degree AV block   . Pacemaker     Past Surgical History  Procedure Laterality Date  . Permanent pacemaker insertion N/A 08/04/2014    Procedure: PERMANENT PACEMAKER INSERTION;  Surgeon: Deboraha Sprang, MD;  Location: [facility] CATH LAB;  Service: Cardiovascular;  Laterality: N/A;    Current Outpatient Prescriptions  Medication Sig Dispense Refill  . acetaminophen (TYLENOL) 325 MG tablet Take 1-2 tablets (325-650 mg total) by mouth every 4 (four) hours as needed for mild pain.    Marland Kitchen alosetron (LOTRONEX) 1 MG tablet Take 1 mg by mouth 2 (two) times daily.    Marland Kitchen ALPRAZolam (XANAX) 0.5 MG tablet Take 0.5 mg by mouth at bedtime as needed for anxiety.     Marland Kitchen aspirin 81 MG tablet Take 81 mg by mouth daily.      . calcium-vitamin D (OSCAL WITH D) 500-200 MG-UNIT per tablet Take 1 tablet by mouth daily.      . carvedilol (COREG) 12.5 MG tablet Take 12.5 mg by mouth 2 (two) times daily with a meal.    . lidocaine (LIDODERM) 5 % Place 1 patch onto the skin daily.     . multivitamin-iron-minerals-folic acid (CENTRUM) chewable tablet Chew 1 tablet by mouth daily.      Marland Kitchen OVER THE COUNTER MEDICATION Take 1 tablet by mouth See admin instructions. VID 2 12.5 take twice a month on the 15th and 30th    . valsartan (DIOVAN) 160 MG tablet Take 160 mg by mouth daily.     No current facility-administered medications for this visit.    Allergies  Allergen Reactions  . Codeine     unknown  . Sulfa Antibiotics     unknown    Review of Systems negative except from HPI and PMH  Physical Exam BP 130/62 mmHg  Pulse 60  Ht 5' 2.5" (1.588 m)  Wt 147 lb 3.2 oz (66.769 kg)  BMI 26.48 kg/m2 Well developed and well nourished in no  acute distress HENT normal E scleral and icterus clear Neck Supple Clear to ausculation Device pocket well healed; without hematoma or erythema.  There is no tethering Regular rate and rhythm, no murmurs gallops or rub Soft with active bowel sounds No clubbing cyanosis  Edema Alert and oriented, grossly normal motor and sensory function Skin Warm and Dry  Apacing with pseufofusion Assessment and  Plan  2:1 av Block  intrinisic conduction now evident  Pacemaker [employer] The patient's device was interrogated and the information was fully reviewed.  The device was reprogrammed to  Maximize longevity

## 2014-11-14 NOTE — Patient Instructions (Signed)
Your physician recommends that you continue on your current medications as directed. Please refer to the Current Medication list given to you today.  Remote monitoring is used to monitor your Pacemaker of ICD from home. This monitoring reduces the number of office visits required to check your device to one time per year. It allows [place] to keep an eye on the functioning of your device to ensure it is working properly. You are scheduled for a device check from home on 02/13/15. You may send your transmission at any time that day. If you have a wireless device, the transmission will be sent automatically. After your physician reviews your transmission, you will receive a postcard with your next transmission date.  Your physician wants you to follow-up in: 9 months with Dr. Caryl Comes.  You will receive a reminder letter in the mail two months in advance. If you don't receive a letter, please call our office to schedule the follow-up appointment.

## 2015-02-13 ENCOUNTER — Telehealth: Payer: Self-pay | Admitting: Cardiology

## 2015-02-13 ENCOUNTER — Encounter: Payer: Medicare Other | Admitting: *Deleted

## 2015-02-13 NOTE — Telephone Encounter (Signed)
Attempted to confirm remote transmission with pt. No answer and was unable to leave a message.   

## 2015-02-14 ENCOUNTER — Encounter: Payer: Self-pay | Admitting: Cardiology

## 2015-02-15 ENCOUNTER — Ambulatory Visit (INDEPENDENT_AMBULATORY_CARE_PROVIDER_SITE_OTHER): Payer: Medicare Other | Admitting: *Deleted

## 2015-02-15 DIAGNOSIS — I442 Atrioventricular block, complete: Secondary | ICD-10-CM | POA: Diagnosis not present

## 2015-02-15 LAB — MDC_IDC_ENUM_SESS_TYPE_REMOTE
Battery Voltage: 2.8 V
Brady Statistic AP VP Percent: 5 %
Brady Statistic AS VP Percent: 12 %
Brady Statistic AS VS Percent: 50 %
Lead Channel Impedance Value: 547 Ohm
Lead Channel Pacing Threshold Amplitude: 0.5 V
Lead Channel Pacing Threshold Amplitude: 0.75 V
Lead Channel Pacing Threshold Pulse Width: 0.4 ms
Lead Channel Pacing Threshold Pulse Width: 0.4 ms
Lead Channel Sensing Intrinsic Amplitude: 2.8 mV
Lead Channel Sensing Intrinsic Amplitude: 8 mV
Lead Channel Setting Pacing Amplitude: 2 V
Lead Channel Setting Pacing Pulse Width: 0.4 ms
Lead Channel Setting Sensing Sensitivity: 4 mV
MDC IDC MSMT BATTERY IMPEDANCE: 100 Ohm
MDC IDC MSMT BATTERY REMAINING LONGEVITY: 139 mo
MDC IDC MSMT LEADCHNL RV IMPEDANCE VALUE: 660 Ohm
MDC IDC SESS DTM: 20160324154338
MDC IDC SET LEADCHNL RV PACING AMPLITUDE: 2.5 V
MDC IDC STAT BRADY AP VS PERCENT: 33 %

## 2015-02-16 NOTE — Progress Notes (Signed)
Remote pacemaker transmission.   

## 2015-02-28 ENCOUNTER — Encounter: Payer: Self-pay | Admitting: Cardiology

## 2015-04-27 ENCOUNTER — Encounter: Payer: Self-pay | Admitting: Internal Medicine

## 2015-05-21 ENCOUNTER — Encounter: Payer: Medicare Other | Admitting: *Deleted

## 2015-05-21 ENCOUNTER — Telehealth: Payer: Self-pay | Admitting: Cardiology

## 2015-05-21 NOTE — Telephone Encounter (Signed)
Attempted to confirm remote transmission with pt. No answer and was unable to leave a message.   

## 2015-05-22 ENCOUNTER — Encounter: Payer: Self-pay | Admitting: Cardiology

## 2015-05-25 ENCOUNTER — Ambulatory Visit (INDEPENDENT_AMBULATORY_CARE_PROVIDER_SITE_OTHER): Payer: Medicare Other | Admitting: *Deleted

## 2015-05-25 DIAGNOSIS — Z950 Presence of cardiac pacemaker: Secondary | ICD-10-CM | POA: Diagnosis not present

## 2015-05-25 DIAGNOSIS — R001 Bradycardia, unspecified: Secondary | ICD-10-CM

## 2015-05-25 LAB — CUP PACEART INCLINIC DEVICE CHECK
Battery Impedance: 100 Ohm
Battery Remaining Longevity: 140 mo
Brady Statistic AP VS Percent: 37 %
Date Time Interrogation Session: 20160701144504
Lead Channel Impedance Value: 506 Ohm
Lead Channel Pacing Threshold Amplitude: 0.75 V
Lead Channel Pacing Threshold Pulse Width: 0.4 ms
Lead Channel Pacing Threshold Pulse Width: 0.4 ms
Lead Channel Sensing Intrinsic Amplitude: 11.2 mV
Lead Channel Sensing Intrinsic Amplitude: 4 mV
Lead Channel Setting Pacing Amplitude: 2 V
Lead Channel Setting Pacing Amplitude: 2.5 V
Lead Channel Setting Sensing Sensitivity: 4 mV
MDC IDC MSMT BATTERY VOLTAGE: 2.8 V
MDC IDC MSMT LEADCHNL RA PACING THRESHOLD AMPLITUDE: 0.75 V
MDC IDC MSMT LEADCHNL RV IMPEDANCE VALUE: 651 Ohm
MDC IDC SET LEADCHNL RV PACING PULSEWIDTH: 0.4 ms
MDC IDC STAT BRADY AP VP PERCENT: 3 %
MDC IDC STAT BRADY AS VP PERCENT: 6 %
MDC IDC STAT BRADY AS VS PERCENT: 55 %

## 2015-05-25 NOTE — Progress Notes (Signed)
Pacemaker check in clinic. Normal device function. Thresholds, sensing, impedances consistent with previous measurements. Device programmed to maximize longevity. 4 mode switches--- <0.1%. 2 high ventricular rates noted---1 true @43 + beats, other was SVT. Device programmed at appropriate safety margins. Histogram distribution appropriate for patient activity level. Device programmed to optimize intrinsic conduction. Estimated longevity 11.61yrs. ROV w/ SK in 41mo.

## 2015-07-02 ENCOUNTER — Encounter: Payer: Self-pay | Admitting: Internal Medicine

## 2015-08-07 ENCOUNTER — Ambulatory Visit (INDEPENDENT_AMBULATORY_CARE_PROVIDER_SITE_OTHER): Payer: Medicare Other | Admitting: *Deleted

## 2015-08-07 DIAGNOSIS — I442 Atrioventricular block, complete: Secondary | ICD-10-CM | POA: Diagnosis not present

## 2015-08-07 DIAGNOSIS — R001 Bradycardia, unspecified: Secondary | ICD-10-CM | POA: Diagnosis not present

## 2015-08-07 LAB — CUP PACEART INCLINIC DEVICE CHECK
Battery Impedance: 100 Ohm
Battery Voltage: 2.79 V
Brady Statistic AP VP Percent: 0 %
Brady Statistic AS VS Percent: 63 %
Date Time Interrogation Session: 20160913125554
Lead Channel Pacing Threshold Amplitude: 0.5 V
Lead Channel Pacing Threshold Pulse Width: 0.4 ms
Lead Channel Sensing Intrinsic Amplitude: 11.2 mV
Lead Channel Sensing Intrinsic Amplitude: 4 mV
Lead Channel Setting Pacing Amplitude: 2 V
Lead Channel Setting Pacing Pulse Width: 0.4 ms
Lead Channel Setting Sensing Sensitivity: 4 mV
MDC IDC MSMT BATTERY REMAINING LONGEVITY: 143 mo
MDC IDC MSMT LEADCHNL RA IMPEDANCE VALUE: 563 Ohm
MDC IDC MSMT LEADCHNL RV IMPEDANCE VALUE: 640 Ohm
MDC IDC MSMT LEADCHNL RV PACING THRESHOLD AMPLITUDE: 0.75 V
MDC IDC MSMT LEADCHNL RV PACING THRESHOLD PULSEWIDTH: 0.4 ms
MDC IDC SET LEADCHNL RV PACING AMPLITUDE: 2.5 V
MDC IDC STAT BRADY AP VS PERCENT: 37 %
MDC IDC STAT BRADY AS VP PERCENT: 0 %

## 2015-08-07 NOTE — Progress Notes (Signed)
Pacemaker check in clinic- walk in. Normal device function. Thresholds, sensing, impedances consistent with previous measurements. Device programmed to maximize longevity. 3 mode switches- all < 60 seconds. No high ventricular rates noted. Device programmed at appropriate safety margins. Histogram distribution appropriate for patient activity level. Device programmed to optimize intrinsic conduction. Estimated longevity 9.5-14 years. Patient enrolled in remote follow-up. ROV with SK 11/07/15.

## 2015-09-11 ENCOUNTER — Encounter: Payer: Self-pay | Admitting: Internal Medicine

## 2015-11-07 ENCOUNTER — Encounter: Payer: Medicare Other | Admitting: Internal Medicine

## 2015-11-07 DIAGNOSIS — R0989 Other specified symptoms and signs involving the circulatory and respiratory systems: Secondary | ICD-10-CM

## 2015-11-08 ENCOUNTER — Encounter: Payer: Self-pay | Admitting: Internal Medicine

## 2016-01-31 ENCOUNTER — Other Ambulatory Visit: Payer: Self-pay | Admitting: Internal Medicine

## 2016-01-31 ENCOUNTER — Ambulatory Visit (HOSPITAL_COMMUNITY)
Admission: RE | Admit: 2016-01-31 | Discharge: 2016-01-31 | Disposition: A | Discharge status: Home / Self Care | Payer: Medicare Other | Source: Ambulatory Visit | Attending: Internal Medicine | Admitting: Internal Medicine

## 2016-01-31 DIAGNOSIS — M533 Sacrococcygeal disorders, not elsewhere classified: Secondary | ICD-10-CM | POA: Diagnosis present

## 2016-01-31 DIAGNOSIS — S322XXA Fracture of coccyx, initial encounter for closed fracture: Principal | ICD-10-CM

## 2016-01-31 DIAGNOSIS — S3210XA Unspecified fracture of sacrum, initial encounter for closed fracture: Secondary | ICD-10-CM

## 2016-03-29 ENCOUNTER — Emergency Department (HOSPITAL_COMMUNITY): Payer: Medicare Other

## 2016-03-29 ENCOUNTER — Emergency Department (HOSPITAL_COMMUNITY)
Admission: EM | Admit: 2016-03-29 | Discharge: 2016-03-29 | Disposition: A | Discharge status: Home / Self Care | Payer: Medicare Other | Attending: Emergency Medicine | Admitting: Emergency Medicine

## 2016-03-29 ENCOUNTER — Encounter (HOSPITAL_COMMUNITY): Payer: Self-pay | Admitting: *Deleted

## 2016-03-29 DIAGNOSIS — R112 Nausea with vomiting, unspecified: Secondary | ICD-10-CM

## 2016-03-29 DIAGNOSIS — Z950 Presence of cardiac pacemaker: Secondary | ICD-10-CM | POA: Diagnosis not present

## 2016-03-29 DIAGNOSIS — Z79899 Other long term (current) drug therapy: Secondary | ICD-10-CM | POA: Insufficient documentation

## 2016-03-29 DIAGNOSIS — Z7982 Long term (current) use of aspirin: Secondary | ICD-10-CM | POA: Insufficient documentation

## 2016-03-29 DIAGNOSIS — R05 Cough: Secondary | ICD-10-CM | POA: Diagnosis present

## 2016-03-29 DIAGNOSIS — J069 Acute upper respiratory infection, unspecified: Secondary | ICD-10-CM | POA: Diagnosis not present

## 2016-03-29 DIAGNOSIS — I10 Essential (primary) hypertension: Secondary | ICD-10-CM | POA: Diagnosis not present

## 2016-03-29 LAB — BASIC METABOLIC PANEL
ANION GAP: 12 (ref 5–15)
BUN: 17 mg/dL (ref 6–20)
CALCIUM: 10.6 mg/dL — AB (ref 8.9–10.3)
CO2: 29 mmol/L (ref 22–32)
Chloride: 103 mmol/L (ref 101–111)
Creatinine, Ser: 1.28 mg/dL — ABNORMAL HIGH (ref 0.44–1.00)
GFR, EST AFRICAN AMERICAN: 41 mL/min — AB (ref >60)
GFR, EST NON AFRICAN AMERICAN: 35 mL/min — AB (ref >60)
Glucose, Bld: 153 mg/dL — ABNORMAL HIGH (ref 65–99)
Potassium: 3.8 mmol/L (ref 3.5–5.1)
SODIUM: 144 mmol/L (ref 135–145)

## 2016-03-29 LAB — CBC
HCT: 40.4 % (ref 36.0–46.0)
Hemoglobin: 13 g/dL (ref 12.0–15.0)
MCH: 29.1 pg (ref 26.0–34.0)
MCHC: 32.2 g/dL (ref 30.0–36.0)
MCV: 90.6 fL (ref 78.0–100.0)
PLATELETS: 269 10*3/uL (ref 150–400)
RBC: 4.46 MIL/uL (ref 3.87–5.11)
RDW: 14.9 % (ref 11.5–15.5)
WBC: 7.1 10*3/uL (ref 4.0–10.5)

## 2016-03-29 LAB — URINALYSIS, ROUTINE W REFLEX MICROSCOPIC
BILIRUBIN URINE: NEGATIVE
Glucose, UA: NEGATIVE mg/dL
HGB URINE DIPSTICK: NEGATIVE
KETONES UR: NEGATIVE mg/dL
Leukocytes, UA: NEGATIVE
Nitrite: NEGATIVE
PROTEIN: NEGATIVE mg/dL
Specific Gravity, Urine: 1.014 (ref 1.005–1.030)
pH: 8 (ref 5.0–8.0)

## 2016-03-29 LAB — HEPATIC FUNCTION PANEL
ALBUMIN: 3.7 g/dL (ref 3.5–5.0)
ALT: 13 U/L — ABNORMAL LOW (ref 14–54)
AST: 17 U/L (ref 15–41)
Alkaline Phosphatase: 62 U/L (ref 38–126)
BILIRUBIN INDIRECT: 0.5 mg/dL (ref 0.3–0.9)
Bilirubin, Direct: 0.1 mg/dL (ref 0.1–0.5)
TOTAL PROTEIN: 7.2 g/dL (ref 6.5–8.1)
Total Bilirubin: 0.6 mg/dL (ref 0.3–1.2)

## 2016-03-29 LAB — LIPASE, BLOOD: LIPASE: 19 U/L (ref 11–51)

## 2016-03-29 LAB — I-STAT TROPONIN, ED: Troponin i, poc: 0 ng/mL (ref 0.00–0.08)

## 2016-03-29 MED ORDER — SODIUM CHLORIDE 0.9 % IV BOLUS (SEPSIS)
500.0000 mL | Freq: Once | INTRAVENOUS | Status: AC
  Administered 2016-03-29: 500 mL via INTRAVENOUS

## 2016-03-29 MED ORDER — ONDANSETRON HCL 4 MG/2ML IJ SOLN
4.0000 mg | Freq: Once | INTRAMUSCULAR | Status: AC
  Administered 2016-03-29: 4 mg via INTRAVENOUS
  Filled 2016-03-29: qty 2

## 2016-03-29 MED ORDER — DOXYCYCLINE HYCLATE 100 MG PO CAPS: 100.0000 mg | ORAL_CAPSULE | Freq: Two times a day (BID) | ORAL | Status: DC

## 2016-03-29 MED ORDER — ONDANSETRON 4 MG PO TBDP: 4.0000 mg | ORAL_TABLET | Freq: Three times a day (TID) | ORAL | Status: DC | PRN

## 2016-03-29 NOTE — ED Provider Notes (Signed)
CSN: [id number]     Arrival date & time 03/29/16  1621 History   First MD Initiated Contact with Patient 03/29/16 1825     Chief Complaint  Patient presents with  . Cough      Patient is a 82 y.o. female presenting with cough. The history is provided by the patient.  Cough Associated symptoms: shortness of breath   Associated symptoms: no chest pain   Patient presents with cough shortness of breath and nausea and vomiting. Reportedly has had all this for a while but got worse recently. Had more shortness of breath when she laid back. No fevers or chills. She has been more week. States she sees Jeanann Lewandowsky as her primary and she was told that the cough is not infectious. States she has a follow-up visit to further discuss things. States today was worse She laid back and had more trouble breathing. Has no diarrhea. Has had some vomiting. Denies nausea. Has had some urinary frequency that has gotten even more frequent recently.  Past Medical History  Diagnosis Date  . Hypertension   . 2Nd degree AV block   . Pacemaker    Past Surgical History  Procedure Laterality Date  . Permanent pacemaker insertion N/A 08/04/2014    Procedure: PERMANENT PACEMAKER INSERTION;  Surgeon: Deboraha Sprang, MD;  Location: [facility] CATH LAB;  Service: Cardiovascular;  Laterality: N/A;   Family History  Problem Relation Age of Onset  . Hypertension Mother   . Heart attack Father    Social History  Substance Use Topics  . Smoking status: Never Smoker   . Smokeless tobacco: None  . Alcohol Use: No   OB History    No data available     Review of Systems  Constitutional: Positive for appetite change and fatigue.  Respiratory: Positive for cough and shortness of breath.   Cardiovascular: Negative for chest pain.  Gastrointestinal: Positive for vomiting. Negative for nausea, abdominal pain, diarrhea and constipation.  Genitourinary: Positive for frequency. Negative for flank pain.  Musculoskeletal: Negative  for back pain.  Skin: Negative for wound.  Neurological: Negative for numbness.  Psychiatric/Behavioral: Negative for agitation.      Allergies  Codeine and Sulfa antibiotics  Home Medications   Prior to Admission medications   Medication Sig Start Date End Date Taking? Authorizing Provider  acetaminophen (TYLENOL) 325 MG tablet Take 1-2 tablets (325-650 mg total) by mouth every 4 (four) hours as needed for mild pain. 08/06/14   Erlene Quan, PA-C  alosetron (LOTRONEX) 1 MG tablet Take 1 mg by mouth 2 (two) times daily.    Historical Provider, MD  ALPRAZolam [name]) 0.5 MG tablet Take 0.5 mg by mouth at bedtime as needed for anxiety.  06/05/14   Historical Provider, MD  aspirin 81 MG tablet Take 81 mg by mouth daily.      Historical Provider, MD  calcium-vitamin D (OSCAL WITH D) 500-200 MG-UNIT per tablet Take 1 tablet by mouth daily.      Historical Provider, MD  carvedilol (COREG) 12.5 MG tablet Take 12.5 mg by mouth 2 (two) times daily with a meal.    Historical Provider, MD  cycloSPORINE (RESTASIS) 0.05 % ophthalmic emulsion 1 drop 2 (two) times daily. Both eyes: 1 morning, 1 nightly, PRN for dry-eye syndrom    Historical Provider, MD  doxycycline (VIBRAMYCIN) 100 MG capsule Take 1 capsule (100 mg total) by mouth 2 (two) times daily. 03/29/16   Davonna Belling, MD  lidocaine (LIDODERM) 5 %  Place 1 patch onto the skin daily.  08/01/14   Historical Provider, MD  multivitamin-iron-minerals-folic acid (CENTRUM) chewable tablet Chew 1 tablet by mouth daily.      Historical Provider, MD  ondansetron (ZOFRAN-ODT) 4 MG disintegrating tablet Take 1 tablet (4 mg total) by mouth every 8 (eight) hours as needed for nausea or vomiting. 03/29/16   Davonna Belling, MD  OVER THE COUNTER MEDICATION Take 1 tablet by mouth See admin instructions. VID 2 12.5 take twice a month on the 15th and 30th    Historical Provider, MD  valsartan (DIOVAN) 160 MG tablet Take 160 mg by mouth daily.    Historical Provider,  MD   BP 117/97 mmHg  Pulse 85  Temp(Src) 98.9 F (37.2 C) (Oral)  Resp 18  SpO2 93% Physical Exam  Constitutional: She appears well-developed.  HENT:  Head: Atraumatic.  Eyes: EOM are normal.  Neck: Neck supple.  Cardiovascular: Normal rate.   Pulmonary/Chest:  Mildly harsh breath sounds  Abdominal: Soft. There is no tenderness.  Musculoskeletal: Normal range of motion.  Neurological: She is alert.  Skin: Skin is warm.    ED Course  Procedures (including critical care time) Labs Review Labs Reviewed  BASIC METABOLIC PANEL - Abnormal; Notable for the following:    Glucose, Bld 153 (*)    Creatinine, Ser 1.28 (*)    Calcium 10.6 (*)    GFR calc non Af Amer 35 (*)    GFR calc Af Amer 41 (*)    All other components within normal limits  URINALYSIS, ROUTINE W REFLEX MICROSCOPIC (NOT AT [facility]) - Abnormal; Notable for the following:    APPearance CLOUDY (*)    All other components within normal limits  HEPATIC FUNCTION PANEL - Abnormal; Notable for the following:    ALT 13 (*)    All other components within normal limits  CBC  LIPASE, BLOOD  I-STAT TROPOININ, ED    Imaging Review Dg Chest 2 View  03/29/2016  CLINICAL DATA:  82 year old female with a history of productive cough EXAM: CHEST - 2 VIEW COMPARISON:  08/05/2014 FINDINGS: Cardiomediastinal silhouette unchanged in size and contour. Partially calcified hilar/mediastinal lymph nodes. No pneumothorax. No pleural effusion. No confluent airspace disease. Similar appearance of asymmetric elevation the right hemidiaphragm with associated atelectasis/scarring. Unchanged position of cardiac pacing device on the left chest wall. No displaced fracture. Unremarkable appearance of the upper abdomen. IMPRESSION: Chronic lung changes without evidence of superimposed acute cardiopulmonary disease. Unchanged cardiac pacing device Signed, Dulcy Fanny. Earleen Newport, DO [facility]  Electronically Signed   By: Corrie Mckusick D.O.   On: 03/29/2016 17:54   I have personally reviewed and evaluated these images and lab results as part of my medical decision-making.   EKG Interpretation   Date/Time:  Saturday Mar 29 2016 16:40:10 EDT Ventricular Rate:  80 PR Interval:  168 QRS Duration: 172 QT Interval:  424 QTC Calculation: 489 R Axis:   -72 Text Interpretation:  Atrial-sensed ventricular-paced rhythm with  occasional AV dual-paced complexes Abnormal ECG Confirmed by Alvino Chapel   MD, Ovid Curd [phone]) on 03/29/2016 7:04:04 PM      MDM   Final diagnoses:  URI (upper respiratory infection)  Non-intractable vomiting with nausea, vomiting of unspecified type    Patient presented with nausea vomiting and cough. X-ray did not show pneumonia, however she has had a productive cough with some sputum. Slightly lower oxygen saturation also. Has been going for a while without improvement. We'll  give doxycycline. Patient feels better and does not have urinary tract infection. Has tolerated orals will be discharged home. Discussed with the patient, her son,( who is a Stage manager,) her granddaughter who will be starting medical school in the fall.    Davonna Belling, MD 03/29/16 [phone]

## 2016-03-29 NOTE — Discharge Instructions (Signed)
Nausea and Vomiting Nausea means you feel sick to your stomach. Throwing up (vomiting) is a reflex where stomach contents come out of your mouth. HOME CARE   Take medicine as told by your doctor.  Do not force yourself to eat. However, you do need to drink fluids.  If you feel like eating, eat a normal diet as told by your doctor.  Eat rice, wheat, potatoes, bread, lean meats, yogurt, fruits, and vegetables.  Avoid high-fat foods.  Drink enough fluids to keep your pee (urine) clear or pale yellow.  Ask your doctor how to replace body fluid losses (rehydrate). Signs of body fluid loss (dehydration) include:  Feeling very thirsty.  Dry lips and mouth.  Feeling dizzy.  Dark pee.  Peeing less than normal.  Feeling confused.  Fast breathing or heart rate. GET HELP RIGHT AWAY IF:   You have blood in your throw up.  You have black or bloody poop (stool).  You have a bad headache or stiff neck.  You feel confused.  You have bad belly (abdominal) pain.  You have chest pain or trouble breathing.  You do not pee at least once every 8 hours.  You have cold, clammy skin.  You keep throwing up after 24 to 48 hours.  You have a fever. MAKE SURE YOU:   Understand these instructions.  Will watch your condition.  Will get help right away if you are not doing well or get worse.   This information is not intended to replace advice given to you by your health care provider. Make sure you discuss any questions you have with your health care provider.   Document Released: 04/28/2008 Document Revised: 02/02/2012 Document Reviewed: 04/11/2011 Elsevier Interactive Patient Education 2016 Elsevier Inc.  Upper Respiratory Infection, Adult Most upper respiratory infections (URIs) are a viral infection of the air passages leading to the lungs. A URI affects the nose, throat, and upper air passages. The most common type of URI is nasopharyngitis and is typically referred to as "the  common cold." URIs run their course and usually go away on their own. Most of the time, a URI does not require medical attention, but sometimes a bacterial infection in the upper airways can follow a viral infection. This is called a secondary infection. Sinus and middle ear infections are common types of secondary upper respiratory infections. Bacterial pneumonia can also complicate a URI. A URI can worsen asthma and chronic obstructive pulmonary disease (COPD). Sometimes, these complications can require emergency medical care and may be life threatening.  CAUSES Almost all URIs are caused by viruses. A virus is a type of germ and can spread from one person to another.  RISKS FACTORS You may be at risk for a URI if:   You smoke.   You have chronic heart or lung disease.  You have a weakened defense (immune) system.   You are very young or very old.   You have nasal allergies or asthma.  You work in crowded or poorly ventilated areas.  You work in health care facilities or schools. SIGNS AND SYMPTOMS  Symptoms typically develop 2-3 days after you come in contact with a cold virus. Most viral URIs last 7-10 days. However, viral URIs from the influenza virus (flu virus) can last 14-18 days and are typically more severe. Symptoms may include:   Runny or stuffy (congested) nose.   Sneezing.   Cough.   Sore throat.   Headache.   Fatigue.   Fever.  Loss of appetite.   Pain in your forehead, behind your eyes, and over your cheekbones (sinus pain).  Muscle aches.  DIAGNOSIS  Your health care provider may diagnose a URI by:  Physical exam.  Tests to check that your symptoms are not due to another condition such as:  Strep throat.  Sinusitis.  Pneumonia.  Asthma. TREATMENT  A URI goes away on its own with time. It cannot be cured with medicines, but medicines may be prescribed or recommended to relieve symptoms. Medicines may help:  Reduce your  fever.  Reduce your cough.  Relieve nasal congestion. HOME CARE INSTRUCTIONS   Take medicines only as directed by your health care provider.   Gargle warm saltwater or take cough drops to comfort your throat as directed by your health care provider.  Use a warm mist humidifier or inhale steam from a shower to increase air moisture. This may make it easier to breathe.  Drink enough fluid to keep your urine clear or pale yellow.   Eat soups and other clear broths and maintain good nutrition.   Rest as needed.   Return to work when your temperature has returned to normal or as your health care provider advises. You may need to stay home longer to avoid infecting others. You can also use a face mask and careful hand washing to prevent spread of the virus.  Increase the usage of your inhaler if you have asthma.   Do not use any tobacco products, including cigarettes, chewing tobacco, or electronic cigarettes. If you need help quitting, ask your health care provider. PREVENTION  The best way to protect yourself from getting a cold is to practice good hygiene.   Avoid oral or hand contact with people with cold symptoms.   Wash your hands often if contact occurs.  There is no clear evidence that vitamin C, vitamin E, echinacea, or exercise reduces the chance of developing a cold. However, it is always recommended to get plenty of rest, exercise, and practice good nutrition.  SEEK MEDICAL CARE IF:   You are getting worse rather than better.   Your symptoms are not controlled by medicine.   You have chills.  You have worsening shortness of breath.  You have brown or red mucus.  You have yellow or brown nasal discharge.  You have pain in your face, especially when you bend forward.  You have a fever.  You have swollen neck glands.  You have pain while swallowing.  You have white areas in the back of your throat. SEEK IMMEDIATE MEDICAL CARE IF:   You have severe  or persistent:  Headache.  Ear pain.  Sinus pain.  Chest pain.  You have chronic lung disease and any of the following:  Wheezing.  Prolonged cough.  Coughing up blood.  A change in your usual mucus.  You have a stiff neck.  You have changes in your:  Vision.  Hearing.  Thinking.  Mood. MAKE SURE YOU:   Understand these instructions.  Will watch your condition.  Will get help right away if you are not doing well or get worse.   This information is not intended to replace advice given to you by your health care provider. Make sure you discuss any questions you have with your health care provider.   Document Released: 05/06/2001 Document Revised: 03/27/2015 Document Reviewed: 02/15/2014 Elsevier Interactive Patient Education [employer].

## 2016-03-29 NOTE — ED Notes (Signed)
Lab to add on Lipase and Hepatic panel

## 2016-03-29 NOTE — ED Notes (Signed)
Pt reports cough, congestion, n/v that has been ongoing for several months but worsened recently.

## 2016-03-29 NOTE — ED Notes (Signed)
Pt stable, family at bedside, states understanding of discharge instructions

## 2016-04-02 ENCOUNTER — Encounter: Payer: Medicare Other | Admitting: Internal Medicine

## 2016-04-03 ENCOUNTER — Encounter: Payer: Self-pay | Admitting: Internal Medicine

## 2016-04-24 ENCOUNTER — Emergency Department (HOSPITAL_COMMUNITY): Payer: Medicare Other

## 2016-04-24 ENCOUNTER — Encounter (HOSPITAL_COMMUNITY): Payer: Self-pay

## 2016-04-24 ENCOUNTER — Observation Stay (HOSPITAL_COMMUNITY)
Admission: EM | Admit: 2016-04-24 | Discharge: 2016-04-29 | Disposition: A | Discharge status: Skilled Nursing Facility | Payer: Medicare Other | Attending: Internal Medicine | Admitting: Internal Medicine

## 2016-04-24 DIAGNOSIS — I959 Hypotension, unspecified: Secondary | ICD-10-CM | POA: Insufficient documentation

## 2016-04-24 DIAGNOSIS — Z950 Presence of cardiac pacemaker: Secondary | ICD-10-CM | POA: Diagnosis present

## 2016-04-24 DIAGNOSIS — N390 Urinary tract infection, site not specified: Secondary | ICD-10-CM | POA: Diagnosis not present

## 2016-04-24 DIAGNOSIS — N179 Acute kidney failure, unspecified: Secondary | ICD-10-CM | POA: Diagnosis not present

## 2016-04-24 DIAGNOSIS — F419 Anxiety disorder, unspecified: Secondary | ICD-10-CM | POA: Insufficient documentation

## 2016-04-24 DIAGNOSIS — R55 Syncope and collapse: Secondary | ICD-10-CM | POA: Diagnosis not present

## 2016-04-24 DIAGNOSIS — N184 Chronic kidney disease, stage 4 (severe): Secondary | ICD-10-CM | POA: Diagnosis present

## 2016-04-24 DIAGNOSIS — I442 Atrioventricular block, complete: Secondary | ICD-10-CM | POA: Diagnosis present

## 2016-04-24 DIAGNOSIS — R262 Difficulty in walking, not elsewhere classified: Secondary | ICD-10-CM | POA: Diagnosis not present

## 2016-04-24 DIAGNOSIS — I129 Hypertensive chronic kidney disease with stage 1 through stage 4 chronic kidney disease, or unspecified chronic kidney disease: Secondary | ICD-10-CM | POA: Insufficient documentation

## 2016-04-24 DIAGNOSIS — Z7982 Long term (current) use of aspirin: Secondary | ICD-10-CM | POA: Insufficient documentation

## 2016-04-24 DIAGNOSIS — R531 Weakness: Secondary | ICD-10-CM | POA: Insufficient documentation

## 2016-04-24 DIAGNOSIS — Z79899 Other long term (current) drug therapy: Secondary | ICD-10-CM | POA: Insufficient documentation

## 2016-04-24 DIAGNOSIS — R1110 Vomiting, unspecified: Secondary | ICD-10-CM

## 2016-04-24 LAB — CBC WITH DIFFERENTIAL/PLATELET
BASOS PCT: 0 %
Basophils Absolute: 0 10*3/uL (ref 0.0–0.1)
EOS ABS: 0.4 10*3/uL (ref 0.0–0.7)
Eosinophils Relative: 6 %
HEMATOCRIT: 39.9 % (ref 36.0–46.0)
HEMOGLOBIN: 12.5 g/dL (ref 12.0–15.0)
Lymphocytes Relative: 23 %
Lymphs Abs: 1.6 10*3/uL (ref 0.7–4.0)
MCH: 28.5 pg (ref 26.0–34.0)
MCHC: 31.3 g/dL (ref 30.0–36.0)
MCV: 90.9 fL (ref 78.0–100.0)
MONO ABS: 0.7 10*3/uL (ref 0.1–1.0)
Monocytes Relative: 11 %
NEUTROS ABS: 4.1 10*3/uL (ref 1.7–7.7)
Neutrophils Relative %: 60 %
Platelets: 249 10*3/uL (ref 150–400)
RBC: 4.39 MIL/uL (ref 3.87–5.11)
RDW: 15 % (ref 11.5–15.5)
WBC: 6.8 10*3/uL (ref 4.0–10.5)

## 2016-04-24 LAB — BASIC METABOLIC PANEL
ANION GAP: 7 (ref 5–15)
BUN: 23 mg/dL — ABNORMAL HIGH (ref 6–20)
CALCIUM: 9 mg/dL (ref 8.9–10.3)
CO2: 29 mmol/L (ref 22–32)
CREATININE: 1.38 mg/dL — AB (ref 0.44–1.00)
Chloride: 108 mmol/L (ref 101–111)
GFR, EST AFRICAN AMERICAN: 37 mL/min — AB (ref >60)
GFR, EST NON AFRICAN AMERICAN: 32 mL/min — AB (ref >60)
Glucose, Bld: 142 mg/dL — ABNORMAL HIGH (ref 65–99)
Potassium: 3.9 mmol/L (ref 3.5–5.1)
Sodium: 144 mmol/L (ref 135–145)

## 2016-04-24 LAB — URINALYSIS, ROUTINE W REFLEX MICROSCOPIC
BILIRUBIN URINE: NEGATIVE
GLUCOSE, UA: NEGATIVE mg/dL
KETONES UR: NEGATIVE mg/dL
Nitrite: POSITIVE — AB
PROTEIN: NEGATIVE mg/dL
Specific Gravity, Urine: 1.02 (ref 1.005–1.030)
pH: 6.5 (ref 5.0–8.0)

## 2016-04-24 LAB — CBC
HEMATOCRIT: 38.7 % (ref 36.0–46.0)
Hemoglobin: 12.1 g/dL (ref 12.0–15.0)
MCH: 28.7 pg (ref 26.0–34.0)
MCHC: 31.3 g/dL (ref 30.0–36.0)
MCV: 91.9 fL (ref 78.0–100.0)
Platelets: 229 10*3/uL (ref 150–400)
RBC: 4.21 MIL/uL (ref 3.87–5.11)
RDW: 15.1 % (ref 11.5–15.5)
WBC: 6.9 10*3/uL (ref 4.0–10.5)

## 2016-04-24 LAB — URINE MICROSCOPIC-ADD ON

## 2016-04-24 LAB — D-DIMER, QUANTITATIVE: D-Dimer, Quant: 0.61 ug/mL-FEU — ABNORMAL HIGH (ref 0.00–0.50)

## 2016-04-24 LAB — CREATININE, SERUM
Creatinine, Ser: 1.14 mg/dL — ABNORMAL HIGH (ref 0.44–1.00)
GFR calc non Af Amer: 40 mL/min — ABNORMAL LOW (ref >60)
GFR, EST AFRICAN AMERICAN: 47 mL/min — AB (ref >60)

## 2016-04-24 LAB — I-STAT TROPONIN, ED: TROPONIN I, POC: 0.01 ng/mL (ref 0.00–0.08)

## 2016-04-24 LAB — TROPONIN I

## 2016-04-24 MED ORDER — GLYCOPYRROLATE 1 MG PO TABS
2.0000 mg | ORAL_TABLET | Freq: Two times a day (BID) | ORAL | Status: DC
  Administered 2016-04-24 – 2016-04-29 (×10): 2 mg via ORAL
  Filled 2016-04-24 (×10): qty 2

## 2016-04-24 MED ORDER — CARVEDILOL 12.5 MG PO TABS
12.5000 mg | ORAL_TABLET | Freq: Two times a day (BID) | ORAL | Status: DC
  Administered 2016-04-25 – 2016-04-29 (×9): 12.5 mg via ORAL
  Filled 2016-04-24 (×9): qty 1

## 2016-04-24 MED ORDER — SODIUM CHLORIDE 0.9% FLUSH
3.0000 mL | Freq: Two times a day (BID) | INTRAVENOUS | Status: DC
  Administered 2016-04-25 – 2016-04-29 (×8): 3 mL via INTRAVENOUS

## 2016-04-24 MED ORDER — VERAPAMIL HCL ER 180 MG PO TBCR
180.0000 mg | EXTENDED_RELEASE_TABLET | Freq: Every day | ORAL | Status: DC
  Administered 2016-04-25 – 2016-04-28 (×4): 180 mg via ORAL
  Filled 2016-04-24 (×4): qty 1

## 2016-04-24 MED ORDER — ACETAMINOPHEN 650 MG RE SUPP: 650.0000 mg | Freq: Four times a day (QID) | RECTAL | Status: DC | PRN

## 2016-04-24 MED ORDER — ALPRAZOLAM 0.5 MG PO TABS: 0.5000 mg | ORAL_TABLET | Freq: Every evening | ORAL | Status: DC | PRN

## 2016-04-24 MED ORDER — ACETAMINOPHEN 325 MG PO TABS
650.0000 mg | ORAL_TABLET | Freq: Four times a day (QID) | ORAL | Status: DC | PRN
  Administered 2016-04-25 – 2016-04-28 (×3): 650 mg via ORAL
  Filled 2016-04-24 (×3): qty 2

## 2016-04-24 MED ORDER — ADULT MULTIVITAMIN W/MINERALS CH: 1.0000 | ORAL_TABLET | Freq: Every day | ORAL | Status: DC

## 2016-04-24 MED ORDER — CYCLOSPORINE 0.05 % OP EMUL
1.0000 [drp] | Freq: Two times a day (BID) | OPHTHALMIC | Status: DC
  Administered 2016-04-24 – 2016-04-29 (×10): 1 [drp] via OPHTHALMIC
  Filled 2016-04-24 (×10): qty 1

## 2016-04-24 MED ORDER — ASPIRIN EC 81 MG PO TBEC
81.0000 mg | DELAYED_RELEASE_TABLET | Freq: Every day | ORAL | Status: DC
  Administered 2016-04-24 – 2016-04-29 (×6): 81 mg via ORAL
  Filled 2016-04-24 (×6): qty 1

## 2016-04-24 MED ORDER — ENOXAPARIN SODIUM 30 MG/0.3ML ~~LOC~~ SOLN
30.0000 mg | SUBCUTANEOUS | Status: DC
  Administered 2016-04-24 – 2016-04-28 (×5): 30 mg via SUBCUTANEOUS
  Filled 2016-04-24 (×5): qty 0.3

## 2016-04-24 MED ORDER — CENTRUM PO CHEW: 1.0000 | CHEWABLE_TABLET | Freq: Every day | ORAL | Status: DC

## 2016-04-24 MED ORDER — CALCIUM CARBONATE-VITAMIN D 500-200 MG-UNIT PO TABS
1.0000 | ORAL_TABLET | Freq: Every day | ORAL | Status: DC
  Administered 2016-04-25 – 2016-04-29 (×5): 1 via ORAL
  Filled 2016-04-24 (×5): qty 1

## 2016-04-24 MED ORDER — SODIUM CHLORIDE 0.9 % IV SOLN
INTRAVENOUS | Status: DC
  Administered 2016-04-24: 19:00:00 via INTRAVENOUS

## 2016-04-24 MED ORDER — ADULT MULTIVITAMIN W/MINERALS CH
1.0000 | ORAL_TABLET | Freq: Every day | ORAL | Status: DC
  Administered 2016-04-25 – 2016-04-29 (×5): 1 via ORAL
  Filled 2016-04-24 (×5): qty 1

## 2016-04-24 NOTE — Progress Notes (Signed)
ED nurse called to inform me that pacemaker interrogation was not complete. Provider Pincus Sanes informed.

## 2016-04-24 NOTE — ED Provider Notes (Signed)
CSN: [id number]     Arrival date & time 04/24/16  1344 History   First MD Initiated Contact with Patient 04/24/16 1346     Chief Complaint  Patient presents with  . Loss of Consciousness     (Consider location/radiation/quality/duration/timing/severity/associated sxs/prior Treatment) HPI Comments: Patient presents after syncopal episode. She has a history of hypertension and second degree AV block. She has an implanted pacemaker. She has a history of problems with her rotator cuff and was being seen at [facility] today. While she was waiting for her ride to pick her up, she had a syncopal episode. She states that she was sitting in her chair and did not fall out of the chair. She had no preceding symptoms. She denies any chest pain shortness of breath or dizziness. She felt like her right arm cramped up a little bit before she passed out. She was unresponsive for a period time at the doctor's office. She was alert on EMS arrival. She states she passed out one time before over Christmas last year and was seen at [facility] in emergency department. She doesn't know what the etiology for the syncope was.  Patient is a 82 y.o. female presenting with syncope.  Loss of Consciousness Associated symptoms: no chest pain, no diaphoresis, no dizziness, no fever, no headaches, no nausea, no shortness of breath, no vomiting and no weakness     Past Medical History  Diagnosis Date  . Hypertension   . 2Nd degree AV block   . Pacemaker    Past Surgical History  Procedure Laterality Date  . Permanent pacemaker insertion N/A 08/04/2014    Procedure: PERMANENT PACEMAKER INSERTION;  Surgeon: Deboraha Sprang, MD;  Location: [facility] CATH LAB;  Service: Cardiovascular;  Laterality: N/A;   Family History  Problem Relation Age of Onset  . Hypertension Mother   . Heart attack Father    Social History  Substance Use Topics  . Smoking status: Never Smoker   . Smokeless tobacco: None  .  Alcohol Use: No   OB History    No data available     Review of Systems  Constitutional: Negative for fever, chills, diaphoresis and fatigue.  HENT: Negative for congestion, rhinorrhea and sneezing.   Eyes: Negative.   Respiratory: Negative for cough, chest tightness and shortness of breath.   Cardiovascular: Positive for syncope. Negative for chest pain and leg swelling.  Gastrointestinal: Negative for nausea, vomiting, abdominal pain, diarrhea and blood in stool.  Genitourinary: Negative for frequency, hematuria, flank pain and difficulty urinating.  Musculoskeletal: Negative for back pain and arthralgias.  Skin: Negative for rash.  Neurological: Positive for syncope. Negative for dizziness, speech difficulty, weakness, numbness and headaches.      Allergies  Codeine and Sulfa antibiotics  Home Medications   Prior to Admission medications   Medication Sig Start Date End Date Taking? Authorizing Provider  acetaminophen (TYLENOL) 325 MG tablet Take 1-2 tablets (325-650 mg total) by mouth every 4 (four) hours as needed for mild pain. 08/06/14  Yes Erlene Quan, PA-C  alosetron (LOTRONEX) 1 MG tablet Take 1 mg by mouth 2 (two) times daily.   Yes Historical Provider, MD  ALPRAZolam [name]) 0.5 MG tablet Take 0.5 mg by mouth at bedtime as needed for anxiety.  06/05/14  Yes Historical Provider, MD  aspirin 81 MG tablet Take 81 mg by mouth daily.     Yes Historical Provider, MD  calcium-vitamin D (OSCAL WITH D) 500-200 MG-UNIT per tablet  Take 1 tablet by mouth daily.     Yes Historical Provider, MD  carvedilol (COREG) 12.5 MG tablet Take 12.5 mg by mouth 2 (two) times daily with a meal.   Yes Historical Provider, MD  cycloSPORINE (RESTASIS) 0.05 % ophthalmic emulsion 1 drop 2 (two) times daily. Both eyes: 1 morning, 1 nightly, PRN for dry-eye syndrom   Yes Historical Provider, MD  glycopyrrolate (ROBINUL) 2 MG tablet Take 2 mg by mouth 2 (two) times daily.   Yes Historical Provider, MD   lidocaine (LIDODERM) 5 % Place 1 patch onto the skin daily.  08/01/14  Yes Historical Provider, MD  multivitamin-iron-minerals-folic acid (CENTRUM) chewable tablet Chew 1 tablet by mouth daily.     Yes Historical Provider, MD  ondansetron (ZOFRAN-ODT) 4 MG disintegrating tablet Take 1 tablet (4 mg total) by mouth every 8 (eight) hours as needed for nausea or vomiting. 03/29/16  Yes Davonna Belling, MD  OVER THE COUNTER MEDICATION Take 1 tablet by mouth See admin instructions. VID 2 12.5 take twice a month on the 15th and 30th   Yes Historical Provider, MD  verapamil (CALAN-SR) 180 MG CR tablet Take 180 mg by mouth at bedtime.   Yes Historical Provider, MD  doxycycline (VIBRAMYCIN) 100 MG capsule Take 1 capsule (100 mg total) by mouth 2 (two) times daily. 03/29/16   Davonna Belling, MD  valsartan (DIOVAN) 160 MG tablet Take 160 mg by mouth daily.    Historical Provider, MD   BP 132/49 mmHg  Pulse 60  Temp(Src) 97.3 F (36.3 C) (Oral)  Resp 18  Ht 5\' 2"  (1.575 m)  Wt 147 lb (66.679 kg)  BMI 26.88 kg/m2  SpO2 99% Physical Exam  Constitutional: She is oriented to person, place, and time. She appears well-developed and well-nourished.  HENT:  Head: Normocephalic and atraumatic.  Eyes: Pupils are equal, round, and reactive to light.  Neck: Normal range of motion. Neck supple.  Cardiovascular: Normal rate, regular rhythm and normal heart sounds.   Pulmonary/Chest: Effort normal and breath sounds normal. No respiratory distress. She has no wheezes. She has no rales. She exhibits no tenderness.  Abdominal: Soft. Bowel sounds are normal. There is no tenderness. There is no rebound and no guarding.  Musculoskeletal: Normal range of motion. She exhibits no edema.  Lymphadenopathy:    She has no cervical adenopathy.  Neurological: She is alert and oriented to person, place, and time. She has normal strength. No cranial nerve deficit or sensory deficit. GCS eye subscore is 4. GCS verbal subscore is 5.  GCS motor subscore is 6.  Skin: Skin is warm and dry. No rash noted.  Psychiatric: She has a normal mood and affect.    ED Course  Procedures (including critical care time) Labs Review Labs Reviewed  BASIC METABOLIC PANEL - Abnormal; Notable for the following:    Glucose, Bld 142 (*)    BUN 23 (*)    Creatinine, Ser 1.38 (*)    GFR calc non Af Amer 32 (*)    GFR calc Af Amer 37 (*)    All other components within normal limits  CBC WITH DIFFERENTIAL/PLATELET  URINALYSIS, ROUTINE W REFLEX MICROSCOPIC (NOT AT [facility])  Randolm Idol, ED    Imaging Review Dg Chest 2 View  04/24/2016  CLINICAL DATA:  syncopePassed out today at [place] having shoulder injectionHypertensionPacemaker Z95.0 EXAM: CHEST  2 VIEW COMPARISON:  03/29/2016 FINDINGS: He cardiac silhouette is normal in size and configuration. The left anterior chest wall sequential  pacemaker leads are well positioned in the right atrium right ventricle, and stable. No mediastinal or hilar masses or enlarged lymph nodes. There are calcified nodes along the right peritracheal mediastinum and right hilum. Elevation the right hemidiaphragm is stable. Lungs are clear.  No pleural effusion or pneumothorax. Bony thorax is demineralized but intact. IMPRESSION: No acute cardiopulmonary disease. Electronically Signed   By: Lajean Manes M.D.   On: 04/24/2016 14:33   I have personally reviewed and evaluated these images and lab results as part of my medical decision-making.   EKG Interpretation   Date/Time:  Thursday April 24 2016 13:44:34 EDT Ventricular Rate:  60 PR Interval:  185 QRS Duration: 148 QT Interval:  488 QTC Calculation: 488 R Axis:   -72 Text Interpretation:  A-V dual-paced rhythm with some inhibition No  further analysis attempted due to paced rhythm Confirmed by Ceniyah Thorp  MD,  Xzavien Harada ([id number]) on 04/24/2016 2:10:25 PM      MDM   Final diagnoses:  Syncope, unspecified syncope type   Patient presents after syncopal  episode. She had no preceding symptoms. She currently denies any symptoms. She's neurologically intact. She has no suggestions of acute coronary syndrome or pulmonary embolus. She does have a pacemaker in place but as of yet, we have been unable to interrogated. The [employer] rep is working on the problem. I will consult the hospitalist for admission on telemetry.  We as of now have been unable to interrogate the pacemaker. They are still working on transmission. I spoke with Dr. Tana Coast with the [facility] hospitalist service who will admit the patient for observation telemetry.  Malvin Johns, MD 04/24/16 [phone]

## 2016-04-24 NOTE — ED Notes (Addendum)
Iv attempted without success. Pacemaker interrogated and transmitted.

## 2016-04-24 NOTE — ED Notes (Signed)
Pt oob to br with steady gait 

## 2016-04-24 NOTE — ED Notes (Addendum)
Pt again has pacemaker interrogated and information sent after [employer] contacted and they have no record of interrogation being sent earlier.

## 2016-04-24 NOTE — H&P (Signed)
History and Physical        Hospital Admission Note Date: 04/24/2016  Patient name: Victoria Lloyd Medical record number: [id number] Date of birth: 10-17-1922 Age: 82 y.o. Gender: female  PCP: [name], MD   Referring physician: Dr Tamera Punt   Chief Complaint:  Syncopal episode at orthopedic office  HPI: Patient is a 82 year old female with hypertension, pacemaker due to third-degree AV block (9/15), was functional with her ADLs presented from orthopedic office with a syncopal episode. History was obtained from the patient and her daughter, granddaughter in the room. Patient has a history of chronic rotator cuff shoulder pain and was seen at [facility] today. Prior to her appointment, patient got lost and was confused while she was waiting for her right to be cut up, she had a syncopal episode. Patient reports she was sitting in her chair and does not remember any preceding events. She denied any chest pain, shortness of breath or any dizziness. She did feel that her right arm and hand was cramping. She was back to her baseline after she was alert. Per patient's family, she had passed out one time before on Christmas last year and was seen in [facility] ED.   ED work-up/course:  CBC, BMET unremarkable except creatinine 1.3, BUN 23, creatinine 1.2 on 5/6   troponin 0.01 EKG showed paced rhythm, rate 60  Review of Systems: Positives marked in 'bold' Constitutional: Denies fever, chills, diaphoresis, poor appetite and fatigue.  HEENT: Denies photophobia, eye pain, redness, hearing loss, ear pain, congestion, sore throat, rhinorrhea, sneezing, mouth sores, trouble swallowing, neck pain, neck stiffness and tinnitus.   Respiratory: patient was recently seen in ED on 5/6 due to bronchitis, per patient's daughter, patient has noticed patient is easily winded with  exertion for last couple of months  Cardiovascular: no chest pain or leg swelling or weight gain . + Orthopnea patient sleeps on 2 pillows  Gastrointestinal: Denies nausea, vomiting, abdominal pain, constipation, blood in stool and abdominal distention.  per patient, she often has off-and-on diarrhea however no recent nausea, vomiting or diarrhea.,   Genitourinary: Denies dysuria, urgency, frequency, hematuria, flank pain and difficulty urinating.  Musculoskeletal: Denies myalgias, joint swelling, arthralgias and gait problem. + chronic back pain with shoulder pain Skin: Denies pallor, rash and wound.  Neurological: Denies dizziness, seizures, numbness and headaches. + syncopal episode today  Hematological: Denies adenopathy. Easy bruising, personal or family bleeding history  Psychiatric/Behavioral: Denies suicidal ideation, mood changes, confusion, nervousness, sleep disturbance and agitation  Past Medical History: Past Medical History  Diagnosis Date  . Hypertension   . 2Nd degree AV block   . Pacemaker     Past Surgical History  Procedure Laterality Date  . Permanent pacemaker insertion N/A 08/04/2014    Procedure: PERMANENT PACEMAKER INSERTION;  Surgeon: Deboraha Sprang, MD;  Location: [facility] CATH LAB;  Service: Cardiovascular;  Laterality: N/A;    Medications: Prior to Admission medications   Medication Sig Start Date End Date Taking? Authorizing Provider  acetaminophen (TYLENOL) 325 MG tablet Take 1-2 tablets (325-650 mg total) by mouth every 4 (four) hours as needed for mild pain. 08/06/14  Yes Erlene Quan, PA-C  alosetron (LOTRONEX) 1 MG tablet Take 1 mg by mouth 2 (two) times daily.   Yes Historical Provider, MD  ALPRAZolam [name]) 0.5 MG tablet Take 0.5 mg by mouth at bedtime as needed for anxiety.  06/05/14  Yes Historical Provider, MD  aspirin 81 MG tablet Take 81 mg by mouth daily.     Yes Historical Provider, MD  calcium-vitamin D (OSCAL WITH D) 500-200 MG-UNIT per tablet Take  1 tablet by mouth daily.     Yes Historical Provider, MD  carvedilol (COREG) 12.5 MG tablet Take 12.5 mg by mouth 2 (two) times daily with a meal.   Yes Historical Provider, MD  cycloSPORINE (RESTASIS) 0.05 % ophthalmic emulsion 1 drop 2 (two) times daily. Both eyes: 1 morning, 1 nightly, PRN for dry-eye syndrom   Yes Historical Provider, MD  glycopyrrolate (ROBINUL) 2 MG tablet Take 2 mg by mouth 2 (two) times daily.   Yes Historical Provider, MD  lidocaine (LIDODERM) 5 % Place 1 patch onto the skin daily.  08/01/14  Yes Historical Provider, MD  multivitamin-iron-minerals-folic acid (CENTRUM) chewable tablet Chew 1 tablet by mouth daily.     Yes Historical Provider, MD  ondansetron (ZOFRAN-ODT) 4 MG disintegrating tablet Take 1 tablet (4 mg total) by mouth every 8 (eight) hours as needed for nausea or vomiting. 03/29/16  Yes Davonna Belling, MD  OVER THE COUNTER MEDICATION Take 1 tablet by mouth See admin instructions. VID 2 12.5 take twice a month on the 15th and 30th   Yes Historical Provider, MD  verapamil (CALAN-SR) 180 MG CR tablet Take 180 mg by mouth at bedtime.   Yes Historical Provider, MD  doxycycline (VIBRAMYCIN) 100 MG capsule Take 1 capsule (100 mg total) by mouth 2 (two) times daily. 03/29/16   Davonna Belling, MD  valsartan (DIOVAN) 160 MG tablet Take 160 mg by mouth daily.    Historical Provider, MD    Allergies:   Allergies  Allergen Reactions  . Codeine     unknown  . Sulfa Antibiotics     unknown    Social History:  reports that she has never smoked. She does not have any smokeless tobacco history on file. She reports that she does not drink alcohol or use illicit drugs.She lives at home by herself and is functional with her ADLs, ambulates with a cane   Family History: Family History  Problem Relation Age of Onset  . Hypertension Mother   . Heart attack Father     Physical Exam: Blood pressure 178/83, pulse 68, temperature 97.3 F (36.3 C), temperature source Oral,  resp. rate 21, height 5\' 2"  (1.575 m), weight 66.679 kg (147 lb), SpO2 96 %. General: Alert, awake, oriented x3, in no acute distress. HEENT: normocephalic, atraumatic, anicteric sclera, pink conjunctiva, pupils equal and reactive to light and accomodation, oropharynx clear Neck: supple, no masses or lymphadenopathy, no goiter, no bruits  Heart: Regular rate and rhythm, without murmurs, rubs or gallops. Lungs: Clear to auscultation bilaterally, no wheezing, rales or rhonchi. Abdomen: Soft, nontender, nondistended, positive bowel sounds, no masses. Extremities: No clubbing, cyanosis or edema with positive pedal pulses. Neuro: Grossly intact, no focal neurological deficits, strength 5/5 upper and lower extremities bilaterally Psych: alert and oriented x 3, normal mood and affect Skin: no rashes or lesions, warm and dry   LABS on Admission:  Basic Metabolic Panel:  Recent Labs Lab 04/24/16 1408  NA 144  K 3.9  CL 108  CO2 29  GLUCOSE 142*  BUN 23*  CREATININE 1.38*  CALCIUM 9.0   Liver Function Tests: No results for input(s): AST, ALT, ALKPHOS, BILITOT, PROT, ALBUMIN in the last 168 hours. No results for input(s): LIPASE, AMYLASE in the last 168 hours. No results for input(s): AMMONIA in the last 168 hours. CBC:  Recent Labs Lab 04/24/16 1408  WBC 6.8  NEUTROABS 4.1  HGB 12.5  HCT 39.9  MCV 90.9  PLT 249   Cardiac Enzymes: No results for input(s): CKTOTAL, CKMB, CKMBINDEX, TROPONINI in the last 168 hours. BNP: Invalid input(s): POCBNP CBG: No results for input(s): GLUCAP in the last 168 hours.  Radiological Exams on Admission:  Dg Chest 2 View  04/24/2016  CLINICAL DATA:  syncopePassed out today at [place] office having shoulder injectionHypertensionPacemaker Z95.0 EXAM: CHEST  2 VIEW COMPARISON:  03/29/2016 FINDINGS: He cardiac silhouette is normal in size and configuration. The left anterior chest wall sequential pacemaker leads are well positioned in the right  atrium right ventricle, and stable. No mediastinal or hilar masses or enlarged lymph nodes. There are calcified nodes along the right peritracheal mediastinum and right hilum. Elevation the right hemidiaphragm is stable. Lungs are clear.  No pleural effusion or pneumothorax. Bony thorax is demineralized but intact. IMPRESSION: No acute cardiopulmonary disease. Electronically Signed   By: Lajean Manes M.D.   On: 04/24/2016 14:33   Dg Chest 2 View  03/29/2016  CLINICAL DATA:  82 year old female with a history of productive cough EXAM: CHEST - 2 VIEW COMPARISON:  08/05/2014 FINDINGS: Cardiomediastinal silhouette unchanged in size and contour. Partially calcified hilar/mediastinal lymph nodes. No pneumothorax. No pleural effusion. No confluent airspace disease. Similar appearance of asymmetric elevation the right hemidiaphragm with associated atelectasis/scarring. Unchanged position of cardiac pacing device on the left chest wall. No displaced fracture. Unremarkable appearance of the upper abdomen. IMPRESSION: Chronic lung changes without evidence of superimposed acute cardiopulmonary disease. Unchanged cardiac pacing device Signed, Dulcy Fanny. Earleen Newport, DO [facility] Electronically Signed   By: Corrie Mckusick D.O.   On: 03/29/2016 17:54    *I have personally reviewed the images above*  EKG: Independently reviewed.  paced rhythm, rate 60    Assessment/Plan Principal Problem:   Syncope: Possibly a vasovagal episode however rule out any cardiac cause - Admit to telemetry for observation, rule out any arrhythmias, obtain serial cardiac enzymes - Obtain 2-D echocardiogram for further workup - Pacemaker interrogation in ED - PT OT evaluation, check orthostatic vitals, gentle hydration  Active Problems:   AV block, 3rd degree (HCC), status post pacemaker St. Jude implanted on 08/04/14 - Pacemaker will be interrogated in ED    Mild acute on CKD  (chronic kidney disease) stage 4, GFR 15-29 ml/min (HCC) - place on gentle hydration, recheck BMET in am  Hypertension  - Currently stable, continue Coreg, verapamil   Recent bronchitis -Currently stable, no wheezing   DVT prophylaxis:  Lovenox   CODE STATUS: full code,  discussed with the patient   Consults called:  none   Family Communication: Admission, patients condition and plan of care including tests being ordered have been discussed with the patient, daughter  and  granddaughter who indicates understanding and agree with the plan and Code Status  Admission status: admit for observation telemetry   Disposition plan: Further plan will depend as patient's clinical course evolves and further radiologic and laboratory data become available. Likely home in a.m.  Time Spent on Admission: 60 mins   Evan Mackie M.D. [facility] 04/24/2016, 5:40 PM Pager:  [phone]  If 7PM-7AM, please contact night-coverage [email or web address] Password [email or web address]

## 2016-04-24 NOTE — ED Notes (Signed)
Pt arrives EMS with c/o becoming unresponsive while at MD office. Pt did not respond to sternal rub or "lite compression" given due to weak pulse. Pt was alert on EMS arrival at the MD office. Pt arrives here a&o x 4 MAEW resp even and non labored

## 2016-04-25 ENCOUNTER — Other Ambulatory Visit (HOSPITAL_COMMUNITY): Payer: Medicare Other

## 2016-04-25 ENCOUNTER — Observation Stay (HOSPITAL_COMMUNITY): Payer: Medicare Other

## 2016-04-25 ENCOUNTER — Encounter (HOSPITAL_COMMUNITY): Payer: Self-pay | Admitting: Radiology

## 2016-04-25 DIAGNOSIS — N184 Chronic kidney disease, stage 4 (severe): Secondary | ICD-10-CM | POA: Diagnosis not present

## 2016-04-25 DIAGNOSIS — I442 Atrioventricular block, complete: Secondary | ICD-10-CM | POA: Diagnosis not present

## 2016-04-25 DIAGNOSIS — Z950 Presence of cardiac pacemaker: Secondary | ICD-10-CM | POA: Diagnosis not present

## 2016-04-25 LAB — TROPONIN I
Troponin I: <0.03 ng/mL (ref <0.031)
Troponin I: <0.03 ng/mL (ref <0.031)

## 2016-04-25 LAB — GLUCOSE, CAPILLARY: GLUCOSE-CAPILLARY: 100 mg/dL — AB (ref 65–99)

## 2016-04-25 MED ORDER — LIDOCAINE 5 % EX PTCH
1.0000 | MEDICATED_PATCH | CUTANEOUS | Status: DC
Start: 2016-04-25 — End: 2016-04-29
  Administered 2016-04-25 – 2016-04-28 (×4): 1 via TRANSDERMAL
  Filled 2016-04-25 (×4): qty 1

## 2016-04-25 MED ORDER — IOPAMIDOL (ISOVUE-370) INJECTION 76%
INTRAVENOUS | Status: AC
  Administered 2016-04-25: 80 mL
  Filled 2016-04-25: qty 100

## 2016-04-25 MED ORDER — DEXTROSE 5 % IV SOLN
1.0000 g | INTRAVENOUS | Status: DC
  Administered 2016-04-25 – 2016-04-26 (×2): 1 g via INTRAVENOUS
  Filled 2016-04-25 (×3): qty 10

## 2016-04-25 NOTE — Clinical Social Work Placement (Signed)
   CLINICAL SOCIAL WORK PLACEMENT  NOTE  Date:  04/25/2016  Patient Details  Name: Victoria Lloyd MRN: [id number] Date of Birth: June 09, 1922  Clinical Social Work is seeking post-discharge placement for this patient at the [place] level of care (*CSW will initial, date and re-position this form in  chart as items are completed):  Yes   Patient/family provided with [place] Work Department's list of facilities offering this level of care within the geographic area requested by the patient (or if unable, by the patient's family).  Yes   Patient/family informed of their freedom to choose among providers that offer the needed level of care, that participate in Medicare, Medicaid or managed care program needed by the patient, have an available bed and are willing to accept the patient.  Yes   Patient/family informed of [facility]'s ownership interest in [facility] and [facility], as well as of the fact that they are under no obligation to receive care at these facilities.  PASRR submitted to EDS on 04/25/16     PASRR number received on 04/25/16     Existing PASRR number confirmed on       FL2 transmitted to all facilities in geographic area requested by pt/family on 04/25/16     FL2 transmitted to all facilities within larger geographic area on       Patient informed that his/her managed care company has contracts with or will negotiate with certain facilities, including the following:            Patient/family informed of bed offers received.  Patient chooses bed at       Physician recommends and patient chooses bed at      Patient to be transferred to   on  .  Patient to be transferred to facility by       Patient family notified on   of transfer.  Name of family member notified:        PHYSICIAN       Additional Comment:    _______________________________________________ Candie Chroman, LCSW 04/25/2016, 12:13 PM

## 2016-04-25 NOTE — NC FL2 (Addendum)
[place] MEDICAID FL2 LEVEL OF CARE SCREENING TOOL     IDENTIFICATION  Patient Name: Victoria Lloyd Birthdate: [birth year] Sex: female Admission Date (Current Location): 04/24/2016  [facility] and [place] Number:  Herbalist and Address:  [facility], [place] [address] [place], [place] [address]      Provider Number: [id number]  Attending Physician Name and Address:  Mir Marry Guan, MD  Relative Name and Phone Number:       Current Level of Care: Hospital Recommended Level of Care: [place] Prior Approval Number:    Date Approved/Denied:   PASRR Number: [id number]  Discharge Plan: SNF    Current Diagnoses: Patient Active Problem List   Diagnosis Date Noted  . Syncope 04/24/2016  . Pacemaker- St Jude impalnted 08/04/14 08/06/2014  . AV block, 3rd degree (HCC) 08/03/2014  . Acute CHF- secondary to bradycardia on admision (Nl LVF by echo) 08/03/2014  . CKD (chronic kidney disease) stage 4, GFR 15-29 ml/min (HCC) 08/03/2014  . Bradycardia 08/03/2014  . Hypertension 03/15/2011    Orientation RESPIRATION BLADDER Height & Weight     Self, Time, Situation, Place  Normal Continent Weight: 142 lb (64.411 kg) (scale b) Height:  5\' 2"  (157.5 cm)  BEHAVIORAL SYMPTOMS/MOOD NEUROLOGICAL BOWEL NUTRITION STATUS   (None)  (None) Continent Diet (Heart healthy)  AMBULATORY STATUS COMMUNICATION OF NEEDS Skin   Limited Assist Verbally Normal                       Personal Care Assistance Level of Assistance  Bathing, Feeding, Dressing Bathing Assistance: Limited assistance Feeding assistance: Independent Dressing Assistance: Limited assistance     Functional Limitations Info  Sight, Hearing, Speech Sight Info: Adequate Hearing Info: Adequate Speech Info: Adequate    SPECIAL CARE FACTORS FREQUENCY  PT (By licensed PT), OT (By licensed OT)     PT Frequency: 5 x week OT Frequency: 5 x week             Contractures Contractures Info: Not present    Additional Factors Info  Code Status, Allergies Code Status Info: Full Allergies Info: Codeine, Sulfa Antibiotics           Current Medications (04/25/2016):  This is the current hospital active medication list Current Facility-Administered Medications  Medication Dose Route Frequency Provider Last Rate Last Dose  . 0.9 %  sodium chloride infusion   Intravenous Continuous Ripudeep K Rai, MD 75 mL/hr at 04/24/16 1830    . acetaminophen (TYLENOL) tablet 650 mg  650 mg Oral Q6H PRN Ripudeep Krystal Eaton, MD   650 mg at 04/25/16 [id number]   Or  . acetaminophen (TYLENOL) suppository 650 mg  650 mg Rectal Q6H PRN Ripudeep Krystal Eaton, MD      . ALPRAZolam [name]) tablet 0.5 mg  0.5 mg Oral QHS PRN Ripudeep Krystal Eaton, MD      . aspirin EC tablet 81 mg  81 mg Oral Daily Ripudeep Krystal Eaton, MD   81 mg at 04/25/16 0951  . calcium-vitamin D (OSCAL WITH D) 500-200 MG-UNIT per tablet 1 tablet  1 tablet Oral Daily Ripudeep Krystal Eaton, MD   1 tablet at 04/25/16 0951  . carvedilol (COREG) tablet 12.5 mg  12.5 mg Oral BID WC Ripudeep K Rai, MD   12.5 mg at 04/25/16 0809  . cycloSPORINE (RESTASIS) 0.05 % ophthalmic emulsion 1 drop  1 drop Both Eyes BID Ripudeep Krystal Eaton, MD  1 drop at 04/25/16 0951  . enoxaparin (LOVENOX) injection 30 mg  30 mg Subcutaneous Q24H Ripudeep K Rai, MD   30 mg at 04/24/16 2020  . glycopyrrolate (ROBINUL) tablet 2 mg  2 mg Oral BID Ripudeep Krystal Eaton, MD   2 mg at 04/25/16 0952  . lidocaine (LIDODERM) 5 % 1 patch  1 patch Transdermal Q24H Ripudeep K Rai, MD      . multivitamin with minerals tablet 1 tablet  1 tablet Oral Daily Ripudeep Krystal Eaton, MD   1 tablet at 04/25/16 0951  . sodium chloride flush (NS) 0.9 % injection 3 mL  3 mL Intravenous Q12H Ripudeep K Rai, MD   3 mL at 04/24/16 2227  . verapamil (CALAN-SR) CR tablet 180 mg  180 mg Oral QHS Ripudeep Krystal Eaton, MD         Discharge Medications: Please see discharge summary for a list of discharge  medications.  Relevant Imaging Results:  Relevant Lab Results:   Additional Information SS#: [id number]  Candie Chroman, LCSW

## 2016-04-25 NOTE — Evaluation (Signed)
Physical Therapy Evaluation Patient Details Name: Victoria Lloyd MRN: [id number] DOB: 1922/07/10 Today's Date: 04/25/2016   History of Present Illness  Pt is a 82 y/o F admitted following syncopal episode after feeling her Rt arm and hand cramping. Possible vasovagal episode. Pacemaker interrogation to be completed today.  Pt's PMH includes pacemaker due to 3rd degree AV block, Lt rotator cuff tear, CKD stage 4.    Clinical Impression  Pt admitted with above diagnosis. Pt currently with functional limitations due to the deficits listed below (see PT Problem List). Ms. Hannah Beat has a h/o falls and lives alone.  She presents w/ Bil LE weakness and is unsteady when OOB, requiring up to min assist for safe ambulation.  Recommending ST SNF at d/c and for pt to initiate long term living situation plans. Pt realized that she should not be living alone due to her high fall risk.  Pt will benefit from skilled PT to increase their independence and safety with mobility to allow discharge to the venue listed below.      Follow Up Recommendations SNF;Supervision for mobility/OOB    Equipment Recommendations  None recommended by PT    Recommendations for Other Services OT consult     Precautions / Restrictions Precautions Precautions: Fall Restrictions Weight Bearing Restrictions: No      Mobility  Bed Mobility               General bed mobility comments: Pt on [facility] w/ nurse tech upon PT arrival  Transfers Overall transfer level: Needs assistance Equipment used: Straight cane Transfers: Sit to/from Stand;Stand Pivot Transfers Sit to Stand: Min guard Stand pivot transfers: Min guard       General transfer comment: Unsteady but does not require outside assist for safe sit<>stand or stand pivot.    Ambulation/Gait Ambulation/Gait assistance: Min assist Ambulation Distance (Feet): 80 Feet Assistive device: Straight cane Gait Pattern/deviations: Decreased stride length;Step-through  pattern;Trunk flexed   Gait velocity interpretation: <1.8 ft/sec, indicative of risk for recurrent falls General Gait Details: Flexed trunk and pt unsteady reaching out for railing on wall and objects in room.  HR in 80s.  Cane too high but pt says this is the comfortable height for her.  Stairs            Wheelchair Mobility    Modified Rankin (Stroke Patients Only)       Balance Overall balance assessment: Needs assistance;History of Falls Sitting-balance support: Feet supported;No upper extremity supported Sitting balance-Leahy Scale: Good     Standing balance support: Single extremity supported;During functional activity Standing balance-Leahy Scale: Poor Standing balance comment: Pt unsteady but no physical assist needed. Relies on UE support.                             Pertinent Vitals/Pain Pain Assessment: 0-10 Pain Score: 10-Worst pain ever Pain Location: back pain Pain Descriptors / Indicators: Constant;Aching Pain Intervention(s): Limited activity within patient's tolerance;Monitored during session;Patient requesting pain meds-RN notified    Home Living Family/patient expects to be discharged to:: Unsure Living Arrangements: Alone Available Help at Discharge: Family;Available PRN/intermittently Type of Home: House Home Access: Stairs to enter Entrance Stairs-Rails: Right Entrance Stairs-Number of Steps: 4 Home Layout: One level Home Equipment: Grab bars - tub/shower;Shower seat;Walker - 2 wheels;Cane - single point Additional Comments: "I might go stay with someone or go to a nursing home"    Prior Function Level of Independence: Independent with assistive device(s)  Comments: Uses cane at all times.  Has had 2 falls in the past 6 months from losing her balance.  Threasa Beards comes to house to clean 1x/month.       Hand Dominance   Dominant Hand: Right    Extremity/Trunk Assessment   Upper Extremity Assessment:  (Lt rotator cuff  tear-receiving OPPT)           Lower Extremity Assessment: RLE deficits/detail;LLE deficits/detail RLE Deficits / Details: hypersensativity to light touch.  Knee extension 4/5, hip adduction 4/5 LLE Deficits / Details: hypersensativity to light touch.  Knee extension 4-/5, knee abduction and adduction 4-/5  Cervical / Trunk Assessment: Kyphotic  Communication   Communication: No difficulties  Cognition Arousal/Alertness: Awake/alert Behavior During Therapy: WFL for tasks assessed/performed Overall Cognitive Status: Within Functional Limits for tasks assessed                      General Comments      Exercises        Assessment/Plan    PT Assessment Patient needs continued PT services  PT Diagnosis Difficulty walking;Generalized weakness   PT Problem List Decreased strength;Decreased range of motion;Decreased activity tolerance;Decreased balance;Decreased knowledge of use of DME;Decreased safety awareness;Impaired sensation;Pain  PT Treatment Interventions DME instruction;Gait training;Stair training;Functional mobility training;Therapeutic activities;Therapeutic exercise;Balance training;Patient/family education   PT Goals (Current goals can be found in the Care Plan section) Acute Rehab PT Goals Patient Stated Goal: to go to a rehab facility before determining long term living plans PT Goal Formulation: With patient Time For Goal Achievement: 05/09/16 Potential to Achieve Goals: Good    Frequency Min 3X/week   Barriers to discharge Inaccessible home environment;Decreased caregiver support Lives alone w/ steps to enter home    Co-evaluation               End of Session Equipment Utilized During Treatment: Gait belt Activity Tolerance: Patient limited by pain;Patient limited by fatigue Patient left: in chair;with call bell/phone within reach;with chair alarm set Nurse Communication: Mobility status;Patient requests pain meds    Functional  Assessment Tool Used: Clinical Judgement Functional Limitation: Mobility: Walking and moving around Mobility: Walking and Moving Around Current Status [phone]): At least 20 percent but less than 40 percent impaired, limited or restricted Mobility: Walking and Moving Around Goal Status [phone]): At least 1 percent but less than 20 percent impaired, limited or restricted    Time: 0858-0926 PT Time Calculation (min) (ACUTE ONLY): 28 min   Charges:   PT Evaluation $PT Eval Low Complexity: 1 Procedure PT Treatments $Gait Training: 8-22 mins   PT G Codes:   PT G-Codes **NOT FOR INPATIENT CLASS** Functional Assessment Tool Used: Clinical Judgement Functional Limitation: Mobility: Walking and moving around Mobility: Walking and Moving Around Current Status [id number]): At least 20 percent but less than 40 percent impaired, limited or restricted Mobility: Walking and Moving Around Goal Status [phone]): At least 1 percent but less than 20 percent impaired, limited or restricted   Collie Siad PT, DPT  Pager: [phone] Phone: [phone] 04/25/2016, 9:47 AM

## 2016-04-25 NOTE — Clinical Social Work Note (Signed)
Clinical Social Work Assessment  Patient Details  Name: Victoria Lloyd MRN: [id number] Date of Birth: 11/18/1922  Date of referral:  04/24/16               Reason for consult:  Facility Placement, Discharge Planning                Permission sought to share information with:  Facility Sport and exercise psychologist, Family Supports Permission granted to share information::  Yes, Verbal Permission Granted  Name::     Victoria Lloyd::  SNF's  Relationship::  Son  Contact Information:  [phone]  Housing/Transportation Living arrangements for the past 2 months:  [place] of Information:  Patient, Medical Team Patient Interpreter Needed:  None Criminal Activity/Legal Involvement Pertinent to Current Situation/Hospitalization:  No - Comment as needed Significant Relationships:  Adult Children Lives with:  Self Do you feel safe going back to the place where you live?  Yes Need for family participation in patient care:  Yes (Comment)  Care giving concerns:  PT recommending SNF at discharge.   Social Worker assessment / plan:  CSW met with patient. No supports at bedside but she was on the phone with her son. CSW introduced role and explained that discharge planning would be discussed. Discussed SNF recommendation. Patient agreeable. Discussed consult about ALF's. Patient said that she is not ready for that yet but allowed CSW to provide her with resources to local ALF's. Patient gave verbal permission to fax her information to facilities in the [facility] area. CSW called patient's son, per MD request, regarding questions about SNF placement. CSW left a voicemail with contact information. No further concerns. CSW encouraged patient to contact CSW as needed. CSW will continue to follow patient and facilitate discharge to SNF once medically stable.  Employment status:  Retired Nurse, adult PT Recommendations:  [place]  / Referral to community resources:  [place]  Patient/Family's Response to care:  Patient agreeable to SNF placement. Patient's son supportive and involved in patient's care. Patient polite and appreciated social work intervention.  Patient/Family's Understanding of and Emotional Response to Diagnosis, Current Treatment, and Prognosis:  Patient and her son knowledgeable of medical interventions and aware of possible discharge to SNF once medically stable.  Emotional Assessment Appearance:  Appears stated age Attitude/Demeanor/Rapport:   (Pleasant) Affect (typically observed):  Accepting, Appropriate, Calm, Pleasant Orientation:  Oriented to Self, Oriented to Place, Oriented to  Time, Oriented to Situation Alcohol / Substance use:  Never Used Psych involvement (Current and /or in the community):  No (Comment)  Discharge Needs  Concerns to be addressed:  Care Coordination Readmission within the last 30 days:  No Current discharge risk:  Dependent with Mobility, Lives alone Barriers to Discharge:  No Barriers Identified   Candie Chroman, LCSW 04/25/2016, 11:49 AM

## 2016-04-25 NOTE — Clinical Social Work Note (Signed)
CSW presented bed offers to patient. She would like to discuss with her son. Patient's son has not yet returned CSW voicemail. CSW notified patient that there would be a different Education officer, museum over the weekend.  Dayton Scrape, [place]

## 2016-04-25 NOTE — Progress Notes (Signed)
[facility]                                                                              Patient Demographics  Victoria Lloyd, is a 82 y.o. female, DOB - [birth year], [id number]  Admit date - 04/24/2016   Admitting Physician Dymond Spreen Krystal Eaton, MD  Outpatient Primary MD for the patient is [name], MD  Outpatient specialists:   LOS -   days    Chief Complaint  Patient presents with  . Loss of Consciousness       Brief summary  Patient is a 82 year old female with hypertension, pacemaker due to third-degree AV block (9/15), was functional with her ADLs presented from orthopedic office with a syncopal episode. History was obtained from the patient and her daughter, granddaughter in the room. Patient has a history of chronic rotator cuff shoulder pain and was seen at [place] today. Prior to her appointment, patient got lost and was confused while she was waiting for her right to be cut up, she had a syncopal episode. Patient reports she was sitting in her chair and does not remember any preceding events. She denied any chest pain, shortness of breath or any dizziness. She did feel that her right arm and hand was cramping. She was back to her baseline after she was alert. Per patient's family, she had passed out one time before on Christmas last year and was seen in [facility] ED  Patient was admitted for syncopal episode.  Assessment & Plan   Principal Problem:  Syncope: Possibly a vasovagal episode however rule out any cardiac cause - Troponins 3 negative - Will have pacer interrogation today - Follow 2-D echo - D-dimer 0.61, obtain CTA chest to rule out PE - PT pending skilled nursing facility  Active Problems: Urinary tract infection Follow urine culture and sensitivities (add on), placed on IV Rocephin   AV block, 3rd degree (HCC), status post pacemaker St. Jude implanted on 08/04/14 - Pacemaker will be interrogated   Mild acute  on CKD (chronic kidney disease) stage 4, GFR 15-29 ml/min (HCC) - Creatinine improved to 1.1 today, 1.3 on admission  Hypertension  - Currently stable, continue Coreg, verapamil   Recent bronchitis -Currently stable, no wheezing   DVT prophylaxis: Lovenox   CODE STATUS: full code  Family Communication: Discussed in detail with the patient, all imaging results, lab results explained to the patient and patient's son, Dr Franky Macho on phone, [phone]   Disposition Plan: Pending workup, skilled nursing facility, likely Monday  Time Spent in minutes   25 minutes  Procedures:  None   Consultants:   None   Antimicrobials:   IV Rocephin 6/2   Medications  Scheduled Meds: . aspirin EC  81 mg Oral Daily  . calcium-vitamin D  1 tablet Oral Daily  . carvedilol  12.5 mg Oral BID WC  . cycloSPORINE  1 drop Both Eyes BID  . enoxaparin (LOVENOX) injection  30 mg Subcutaneous Q24H  . glycopyrrolate  2 mg Oral BID  . lidocaine  1 patch Transdermal Q24H  . multivitamin with minerals  1 tablet Oral Daily  .  sodium chloride flush  3 mL Intravenous Q12H  . verapamil  180 mg Oral QHS   Continuous Infusions: . sodium chloride Stopped (04/25/16 1045)   PRN Meds:.acetaminophen **OR** acetaminophen, ALPRAZolam   Antibiotics   Anti-infectives    None        Subjective:   Victoria Lloyd was seen and examined today. Patient denies dizziness, chest pain, abdominal pain, N/V/D/C, new weakness, numbess, tingling. No acute events overnight.  No new shortness of breath  Objective:   Filed Vitals:   04/24/16 2150 04/25/16 0047 04/25/16 0535 04/25/16 0807  BP:  176/68 138/101 171/72  Pulse: 74 66 77 60  Temp: 98 F (36.7 C) 97.9 F (36.6 C) 98 F (36.7 C)   TempSrc: Oral Oral Oral   Resp:  18 18 20   Height:      Weight:   64.411 kg (142 lb)   SpO2: 96% 96% 97% 98%    Intake/Output Summary (Last 24 hours) at 04/25/16 1153 Last data filed at 04/25/16 1010  Gross  per 24 hour  Intake 1177.5 ml  Output    250 ml  Net  927.5 ml     Wt Readings from Last 3 Encounters:  04/25/16 64.411 kg (142 lb)  11/14/14 66.769 kg (147 lb 3.2 oz)  08/06/14 64.638 kg (142 lb 8 oz)     Exam  General: Alert and oriented x 3, NAD  HEENT:  PERRLA, EOMI, Anicteric Sclera, mucous membranes moist.   Neck: Supple, no JVD, no masses  Cardiovascular: S1 S2 auscultated, no rubs, murmurs or gallops. Regular rate and rhythm.  Respiratory: Clear to auscultation bilaterally, no wheezing, rales or rhonchi  Gastrointestinal: Soft, nontender, nondistended, + bowel sounds  Ext: no cyanosis clubbing or edema  Neuro: AAOx3, Cr N's II- XII. Strength 5/5 upper and lower extremities bilaterally  Skin: No rashes  Psych: Normal affect and demeanor, alert and oriented x3    Data Reviewed:  I have personally reviewed following labs and imaging studies  Micro Results No results found for this or any previous visit (from the past 240 hour(s)).  Radiology Reports Dg Chest 2 View  04/24/2016  CLINICAL DATA:  syncopePassed out today at [place] office having shoulder injectionHypertensionPacemaker Z95.0 EXAM: CHEST  2 VIEW COMPARISON:  03/29/2016 FINDINGS: He cardiac silhouette is normal in size and configuration. The left anterior chest wall sequential pacemaker leads are well positioned in the right atrium right ventricle, and stable. No mediastinal or hilar masses or enlarged lymph nodes. There are calcified nodes along the right peritracheal mediastinum and right hilum. Elevation the right hemidiaphragm is stable. Lungs are clear.  No pleural effusion or pneumothorax. Bony thorax is demineralized but intact. IMPRESSION: No acute cardiopulmonary disease. Electronically Signed   By: Lajean Manes M.D.   On: 04/24/2016 14:33   Dg Chest 2 View  03/29/2016  CLINICAL DATA:  82 year old female with a history of productive cough EXAM: CHEST - 2 VIEW COMPARISON:  08/05/2014 FINDINGS:  Cardiomediastinal silhouette unchanged in size and contour. Partially calcified hilar/mediastinal lymph nodes. No pneumothorax. No pleural effusion. No confluent airspace disease. Similar appearance of asymmetric elevation the right hemidiaphragm with associated atelectasis/scarring. Unchanged position of cardiac pacing device on the left chest wall. No displaced fracture. Unremarkable appearance of the upper abdomen. IMPRESSION: Chronic lung changes without evidence of superimposed acute cardiopulmonary disease. Unchanged cardiac pacing device Signed, Dulcy Fanny. Earleen Newport, DO [facility] Radiology Electronically Signed   By: Corrie Mckusick D.O.  On: 03/29/2016 17:54    Lab Data:  CBC:  Recent Labs Lab 04/24/16 1408 04/24/16 1838  WBC 6.8 6.9  NEUTROABS 4.1  --   HGB 12.5 12.1  HCT 39.9 38.7  MCV 90.9 91.9  PLT 249 Q000111Q   Basic Metabolic Panel:  Recent Labs Lab 04/24/16 1408 04/24/16 1838  NA 144  --   K 3.9  --   CL 108  --   CO2 29  --   GLUCOSE 142*  --   BUN 23*  --   CREATININE 1.38* 1.14*  CALCIUM 9.0  --    GFR: Estimated Creatinine Clearance: 27.7 mL/min (by C-G formula based on Cr of 1.14). Liver Function Tests: No results for input(s): AST, ALT, ALKPHOS, BILITOT, PROT, ALBUMIN in the last 168 hours. No results for input(s): LIPASE, AMYLASE in the last 168 hours. No results for input(s): AMMONIA in the last 168 hours. Coagulation Profile: No results for input(s): INR, PROTIME in the last 168 hours. Cardiac Enzymes:  Recent Labs Lab 04/24/16 1838 04/24/16 2342 04/25/16 0618  TROPONINI <0.03 <0.03 <0.03   BNP (last 3 results) No results for input(s): PROBNP in the last 8760 hours. HbA1C: No results for input(s): HGBA1C in the last 72 hours. CBG:  Recent Labs Lab 04/25/16 0626  GLUCAP 100*   Lipid Profile: No results for input(s): CHOL, HDL, LDLCALC, TRIG, CHOLHDL, LDLDIRECT in the last 72 hours. Thyroid  Function Tests: No results for input(s): TSH, T4TOTAL, FREET4, T3FREE, THYROIDAB in the last 72 hours. Anemia Panel: No results for input(s): VITAMINB12, FOLATE, FERRITIN, TIBC, IRON, RETICCTPCT in the last 72 hours. Urine analysis:    Component Value Date/Time   COLORURINE YELLOW 04/24/2016 1730   APPEARANCEUR CLOUDY* 04/24/2016 1730   LABSPEC 1.020 04/24/2016 1730   PHURINE 6.5 04/24/2016 1730   GLUCOSEU NEGATIVE 04/24/2016 1730   HGBUR LARGE* 04/24/2016 1730   BILIRUBINUR NEGATIVE 04/24/2016 1730   KETONESUR NEGATIVE 04/24/2016 1730   PROTEINUR NEGATIVE 04/24/2016 1730   UROBILINOGEN 0.2 07/04/2009 1910   NITRITE POSITIVE* 04/24/2016 1730   LEUKOCYTESUR LARGE* 04/24/2016 1730     Daden Mahany M.D. [facility] 04/25/2016, 11:53 AM  Pager: [phone] Between 7am to 7pm - call Pager - [phone]  After 7pm go to [email or web address] - password [email or web address]  Call night coverage person covering after 7pm

## 2016-04-26 ENCOUNTER — Observation Stay (HOSPITAL_BASED_OUTPATIENT_CLINIC_OR_DEPARTMENT_OTHER): Payer: Medicare Other

## 2016-04-26 DIAGNOSIS — R55 Syncope and collapse: Secondary | ICD-10-CM

## 2016-04-26 DIAGNOSIS — I442 Atrioventricular block, complete: Secondary | ICD-10-CM | POA: Diagnosis not present

## 2016-04-26 DIAGNOSIS — N184 Chronic kidney disease, stage 4 (severe): Secondary | ICD-10-CM | POA: Diagnosis not present

## 2016-04-26 LAB — BASIC METABOLIC PANEL
ANION GAP: 8 (ref 5–15)
BUN: 18 mg/dL (ref 6–20)
CALCIUM: 9.8 mg/dL (ref 8.9–10.3)
CO2: 26 mmol/L (ref 22–32)
Chloride: 107 mmol/L (ref 101–111)
Creatinine, Ser: 1.04 mg/dL — ABNORMAL HIGH (ref 0.44–1.00)
GFR calc Af Amer: 52 mL/min — ABNORMAL LOW (ref >60)
GFR, EST NON AFRICAN AMERICAN: 45 mL/min — AB (ref >60)
GLUCOSE: 98 mg/dL (ref 65–99)
Potassium: 3.6 mmol/L (ref 3.5–5.1)
Sodium: 141 mmol/L (ref 135–145)

## 2016-04-26 LAB — URINE CULTURE

## 2016-04-26 LAB — ECHOCARDIOGRAM COMPLETE
HEIGHTINCHES: 62 in
WEIGHTICAEL: 2137.6 [oz_av]

## 2016-04-26 NOTE — Progress Notes (Signed)
[facility]                                                                              Patient Demographics  Victoria Lloyd, is a 82 y.o. female, DOB - [birth year], [id number]  Admit date - 04/24/2016   Admitting Physician Ripudeep Krystal Eaton, MD  Outpatient Primary MD for the patient is [name], MD  Outpatient specialists:   LOS -   days    Chief Complaint  Patient presents with  . Loss of Consciousness       Brief summary  Patient is a 82 year old female with hypertension, pacemaker due to third-degree AV block (9/15), was functional with her ADLs presented from orthopedic office with a syncopal episode. History was obtained from the patient and her daughter, granddaughter in the room. Patient has a history of chronic rotator cuff shoulder pain and was seen at [place] today. Prior to her appointment, patient got lost and was confused while she was waiting for her right to be cut up, she had a syncopal episode. Patient reports she was sitting in her chair and does not remember any preceding events. She denied any chest pain, shortness of breath or any dizziness. She did feel that her right arm and hand was cramping. She was back to her baseline after she was alert. Per patient's family, she had passed out one time before on Christmas last year and was seen in [facility] ED  Patient was admitted for syncopal episode.  Assessment & Plan      Syncope: Possibly a vasovagal episode however rule out any cardiac cause - Troponins 3 negative - Will have pacer interrogation Pending - Follow 2-D echo completed this morning - D-dimer 0.61,   CTA chest negative for PE - PT recommends SNF    Urinary tract infection Follow urine culture and sensitivities  , placed on IV Rocephin   anticipate discharge tomorrow once urine culture has resulted    AV block, 3rd degree ([place]), status post pacemaker St. Jude implanted on 08/04/14 - Pacemaker will be  interrogated   Mild acute on CKD (chronic kidney disease) stage 4, GFR 15-29 ml/min (HCC) - Creatinine improved to 1.1 today, 1.3 on admission  Hypertension  - Currently stable, continue Coreg, verapamil   Recent bronchitis -Currently stable, no wheezing   DVT prophylaxis: Lovenox   CODE STATUS: full code  Family Communication: Dr. Tana Coast Discussed in detail with the patient, all imaging results, lab results explained to the patient and patient's son, Dr Franky Macho on phone, [phone]   Disposition Plan: Pending workup, skilled nursing facility, likely Monday  Time Spent in minutes   25 minutes  Procedures:  None   Consultants:   None   Antimicrobials:   IV Rocephin 6/2   Medications  Scheduled Meds: . aspirin EC  81 mg Oral Daily  . calcium-vitamin D  1 tablet Oral Daily  . carvedilol  12.5 mg Oral BID WC  . cefTRIAXone (ROCEPHIN)  IV  1 g Intravenous Q24H  . cycloSPORINE  1 drop Both Eyes BID  . enoxaparin (LOVENOX) injection  30 mg Subcutaneous Q24H  .  glycopyrrolate  2 mg Oral BID  . lidocaine  1 patch Transdermal Q24H  . multivitamin with minerals  1 tablet Oral Daily  . sodium chloride flush  3 mL Intravenous Q12H  . verapamil  180 mg Oral QHS   Continuous Infusions: . sodium chloride Stopped (04/25/16 1045)   PRN Meds:.acetaminophen **OR** acetaminophen, ALPRAZolam   Antibiotics   Anti-infectives    Start     Dose/Rate Route Frequency Ordered Stop   04/25/16 1300  cefTRIAXone (ROCEPHIN) 1 g in dextrose 5 % 50 mL IVPB     1 g 100 mL/hr over 30 Minutes Intravenous Every 24 hours 04/25/16 1154          Subjective:   Austynn Menasco was seen and examined today. Patient denies dizziness, chest pain, abdominal pain, N/V/D/C,   Objective:   Filed Vitals:   04/25/16 2047 04/25/16 2048 04/26/16 0615 04/26/16 0937  BP: 159/103 149/112 154/87 140/56  Pulse: 59  60 62  Temp: 98.3 F (36.8 C)  97.8 F (36.6 C)   TempSrc: Oral  Oral   Resp:  17     Height:      Weight:   60.601 kg (133 lb 9.6 oz)   SpO2: 97%  95% 95%    Intake/Output Summary (Last 24 hours) at 04/26/16 1123 Last data filed at 04/26/16 0900  Gross per 24 hour  Intake    740 ml  Output    350 ml  Net    390 ml     Wt Readings from Last 3 Encounters:  04/26/16 60.601 kg (133 lb 9.6 oz)  11/14/14 66.769 kg (147 lb 3.2 oz)  08/06/14 64.638 kg (142 lb 8 oz)     Exam  General: Alert and oriented x 3, NAD  HEENT:  PERRLA, EOMI, Anicteric Sclera, mucous membranes moist.   Neck: Supple, no JVD, no masses  Cardiovascular: S1 S2 auscultated, no rubs, murmurs or gallops. Regular rate and rhythm.  Respiratory: Clear to auscultation bilaterally, no wheezing, rales or rhonchi  Gastrointestinal: Soft, nontender, nondistended, + bowel sounds  Ext: no cyanosis clubbing or edema  Neuro: AAOx3, Cr N's II- XII. Strength 5/5 upper and lower extremities bilaterally  Skin: No rashes  Psych: Normal affect and demeanor, alert and oriented x3    Data Reviewed:  I have personally reviewed following labs and imaging studies  Micro Results Recent Results (from the past 240 hour(s))  Urine culture     Status: Abnormal   Collection Time: 04/25/16  2:59 PM  Result Value Ref Range Status   Specimen Description URINE, RANDOM  Final   Special Requests NONE  Final   Culture MULTIPLE SPECIES PRESENT, SUGGEST RECOLLECTION (A)  Final   Report Status 04/26/2016 FINAL  Final    Radiology Reports Dg Chest 2 View  04/24/2016  CLINICAL DATA:  syncopePassed out today at [place] office having shoulder injectionHypertensionPacemaker Z95.0 EXAM: CHEST  2 VIEW COMPARISON:  03/29/2016 FINDINGS: He cardiac silhouette is normal in size and configuration. The left anterior chest wall sequential pacemaker leads are well positioned in the right atrium right ventricle, and stable. No mediastinal or hilar masses or enlarged lymph nodes. There are calcified nodes along the right  peritracheal mediastinum and right hilum. Elevation the right hemidiaphragm is stable. Lungs are clear.  No pleural effusion or pneumothorax. Bony thorax is demineralized but intact. IMPRESSION: No acute cardiopulmonary disease. Electronically Signed   By: Lajean Manes M.D.   On: 04/24/2016 14:33   Dg  Chest 2 View  03/29/2016  CLINICAL DATA:  82 year old female with a history of productive cough EXAM: CHEST - 2 VIEW COMPARISON:  08/05/2014 FINDINGS: Cardiomediastinal silhouette unchanged in size and contour. Partially calcified hilar/mediastinal lymph nodes. No pneumothorax. No pleural effusion. No confluent airspace disease. Similar appearance of asymmetric elevation the right hemidiaphragm with associated atelectasis/scarring. Unchanged position of cardiac pacing device on the left chest wall. No displaced fracture. Unremarkable appearance of the upper abdomen. IMPRESSION: Chronic lung changes without evidence of superimposed acute cardiopulmonary disease. Unchanged cardiac pacing device Signed, Dulcy Fanny. Earleen Newport, DO [facility] Radiology Electronically Signed   By: Corrie Mckusick D.O.   On: 03/29/2016 17:54   Ct Angio Chest Pe W/cm &/or Wo Cm  04/25/2016  CLINICAL DATA:  Syncopal episode.  Elevated D-dimer. EXAM: CT ANGIOGRAPHY CHEST WITH CONTRAST TECHNIQUE: Multidetector CT imaging of the chest was performed using the standard protocol during bolus administration of intravenous contrast. Multiplanar CT image reconstructions and MIPs were obtained to evaluate the vascular anatomy. CONTRAST:  80 mL of Isovue 370 COMPARISON:  Chest x-ray from yesterday FINDINGS: The central airways are normal. No pneumothorax. There is elevation of the right hemidiaphragm with adjacent atelectasis. No suspicious infiltrates, nodules, or masses. The patient has a pacemaker with leads in the right atrium and right ventricle. There is atherosclerotic change in the thoracic aorta with  no aneurysm or dissection identified. There are coronary artery calcifications. The heart size is borderline but otherwise unremarkable. Prominence to the right superior mediastinum on series 4, image 14 appears to represent prominent vasculature, incompletely evaluated due to timing of contrast. I believe this is probably the right brachiocephalic artery and vein. There are calcified nodes in the bilateral hila and subcarinal region. No uncalcified adenopathy is identified. No pulmonary emboli. No effusions. Evaluation of the upper abdomen is limited with no significant abnormalities. There is a right renal cyst. No acute bony abnormalities. Review of the MIP images confirms the above findings. IMPRESSION: 1. No pulmonary emboli or acute abnormality. Electronically Signed   By: Dorise Bullion III M.D   On: 04/25/2016 13:13    Lab Data:  CBC:  Recent Labs Lab 04/24/16 1408 04/24/16 1838  WBC 6.8 6.9  NEUTROABS 4.1  --   HGB 12.5 12.1  HCT 39.9 38.7  MCV 90.9 91.9  PLT 249 Q000111Q   Basic Metabolic Panel:  Recent Labs Lab 04/24/16 1408 04/24/16 1838 04/26/16 0250  NA 144  --  141  K 3.9  --  3.6  CL 108  --  107  CO2 29  --  26  GLUCOSE 142*  --  98  BUN 23*  --  18  CREATININE 1.38* 1.14* 1.04*  CALCIUM 9.0  --  9.8   GFR: Estimated Creatinine Clearance: 29.6 mL/min (by C-G formula based on Cr of 1.04). Liver Function Tests: No results for input(s): AST, ALT, ALKPHOS, BILITOT, PROT, ALBUMIN in the last 168 hours. No results for input(s): LIPASE, AMYLASE in the last 168 hours. No results for input(s): AMMONIA in the last 168 hours. Coagulation Profile: No results for input(s): INR, PROTIME in the last 168 hours. Cardiac Enzymes:  Recent Labs Lab 04/24/16 1838 04/24/16 2342 04/25/16 0618  TROPONINI <0.03 <0.03 <0.03   BNP (last 3 results) No results for input(s): PROBNP in the last 8760 hours. HbA1C: No results for input(s): HGBA1C in the last 72  hours. CBG:  Recent Labs Lab 04/25/16 0626  GLUCAP 100*  Lipid Profile: No results for input(s): CHOL, HDL, LDLCALC, TRIG, CHOLHDL, LDLDIRECT in the last 72 hours. Thyroid Function Tests: No results for input(s): TSH, T4TOTAL, FREET4, T3FREE, THYROIDAB in the last 72 hours. Anemia Panel: No results for input(s): VITAMINB12, FOLATE, FERRITIN, TIBC, IRON, RETICCTPCT in the last 72 hours. Urine analysis:    Component Value Date/Time   COLORURINE YELLOW 04/24/2016 1730   APPEARANCEUR CLOUDY* 04/24/2016 1730   LABSPEC 1.020 04/24/2016 1730   PHURINE 6.5 04/24/2016 1730   GLUCOSEU NEGATIVE 04/24/2016 1730   HGBUR LARGE* 04/24/2016 1730   BILIRUBINUR NEGATIVE 04/24/2016 1730   KETONESUR NEGATIVE 04/24/2016 1730   PROTEINUR NEGATIVE 04/24/2016 1730   UROBILINOGEN 0.2 07/04/2009 1910   NITRITE POSITIVE* 04/24/2016 1730   LEUKOCYTESUR LARGE* 04/24/2016 [place] M.D. [facility] 04/26/2016, 11:23 AM  Pager: [phone] Between 7am to 7pm - call Pager - [phone]  After 7pm go to [email or web address] - password [email or web address]  Call night coverage person covering after 7pm

## 2016-04-26 NOTE — Progress Notes (Signed)
  Echocardiogram 2D Echocardiogram has been performed.  Donata Clay 04/26/2016, 10:25 AM

## 2016-04-26 NOTE — Clinical Social Work Note (Signed)
Clinical Social Worker continuing to follow patient and family for support and discharge planning needs.  CSW spoke with patient at bedside and patient son over the phone.  Patient son states that he will call back tomorrow with facility choice.  Per RN, patient to discharge Monday.  CSW remains available for support and to facilitate patient discharge needs once medically stable.  Barbette Or, [place]

## 2016-04-27 DIAGNOSIS — I442 Atrioventricular block, complete: Secondary | ICD-10-CM | POA: Diagnosis not present

## 2016-04-27 DIAGNOSIS — N184 Chronic kidney disease, stage 4 (severe): Secondary | ICD-10-CM | POA: Diagnosis not present

## 2016-04-27 DIAGNOSIS — R55 Syncope and collapse: Secondary | ICD-10-CM | POA: Diagnosis not present

## 2016-04-27 LAB — CBC AND DIFFERENTIAL: WBC: 6.1 10*3/mL

## 2016-04-27 LAB — COMPREHENSIVE METABOLIC PANEL
ALBUMIN: 2.8 g/dL — AB (ref 3.5–5.0)
ALK PHOS: 51 U/L (ref 38–126)
ALT: 11 U/L — AB (ref 14–54)
AST: 14 U/L — AB (ref 15–41)
Anion gap: 9 (ref 5–15)
BILIRUBIN TOTAL: 0.7 mg/dL (ref 0.3–1.2)
BUN: 20 mg/dL (ref 6–20)
CO2: 26 mmol/L (ref 22–32)
Calcium: 9.8 mg/dL (ref 8.9–10.3)
Chloride: 106 mmol/L (ref 101–111)
Creatinine, Ser: 0.94 mg/dL (ref 0.44–1.00)
GFR calc Af Amer: 59 mL/min — ABNORMAL LOW (ref >60)
GFR, EST NON AFRICAN AMERICAN: 51 mL/min — AB (ref >60)
GLUCOSE: 109 mg/dL — AB (ref 65–99)
Potassium: 3.5 mmol/L (ref 3.5–5.1)
Sodium: 141 mmol/L (ref 135–145)
TOTAL PROTEIN: 5.8 g/dL — AB (ref 6.5–8.1)

## 2016-04-27 LAB — BASIC METABOLIC PANEL
BUN: 20 mg/dL (ref 4–21)
Creatinine: 0.9 mg/dL (ref 0.5–1.1)
GLUCOSE: 109 mg/dL
Sodium: 141 mmol/L (ref 137–147)

## 2016-04-27 LAB — CBC
HEMATOCRIT: 37.7 % (ref 36.0–46.0)
HEMOGLOBIN: 11.9 g/dL — AB (ref 12.0–15.0)
MCH: 28.2 pg (ref 26.0–34.0)
MCHC: 31.6 g/dL (ref 30.0–36.0)
MCV: 89.3 fL (ref 78.0–100.0)
Platelets: 242 10*3/uL (ref 150–400)
RBC: 4.22 MIL/uL (ref 3.87–5.11)
RDW: 14.9 % (ref 11.5–15.5)
WBC: 6.1 10*3/uL (ref 4.0–10.5)

## 2016-04-27 LAB — HEPATIC FUNCTION PANEL: Bilirubin, Total: 0.7 mg/dL

## 2016-04-27 LAB — GLUCOSE, CAPILLARY: Glucose-Capillary: 110 mg/dL — ABNORMAL HIGH (ref 65–99)

## 2016-04-27 NOTE — Progress Notes (Signed)
[facility]                                                                              Patient Demographics  Victoria Lloyd, is a 82 y.o. female, DOB - 1922/09/15, ST:[id number]  Admit date - 04/24/2016   Admitting Physician Ripudeep Krystal Eaton, MD  Outpatient Primary MD for the patient is [name], MD  Outpatient specialists:   LOS -   days    Chief Complaint  Patient presents with  . Loss of Consciousness       Brief summary  Patient is a 82 year old female with hypertension, pacemaker due to third-degree AV block (9/15), was functional with her ADLs presented from orthopedic office with a syncopal episode. History was obtained from the patient and her daughter, granddaughter in the room. Patient has a history of chronic rotator cuff shoulder pain and was seen at [place] today. Prior to her appointment, patient got lost and was confused while she was waiting for her right to be cut up, she had a syncopal episode. Patient reports she was sitting in her chair and does not remember any preceding events. She denied any chest pain, shortness of breath or any dizziness. She did feel that her right arm and hand was cramping. She was back to her baseline after she was alert. Per patient's family, she had passed out one time before on Christmas last year and was seen in [facility] ED  Patient was admitted for syncopal episode.  Assessment & Plan      Syncope: Possibly a vasovagal episode however rule out any cardiac cause - Troponins 3 negative - Will have pacer interrogation Pending - 6/3 2-D echo without acute findings, has grade 1 diastolic dysfunction, normal EF - D-dimer 0.61,   CTA chest negative for PE - PT recommends SNF, plan SNF d/c Monday 6/5    Urinary tract infection Was placed on Rocephin.  Denies UTI symptoms and UCx with mult org so d/c abx today   AV block, 3rd degree ([place]), status post pacemaker [employer] Jude implanted on  08/04/14 - Pacemaker will be interrogated   Mild acute on CKD (chronic kidney disease) stage 4, GFR 15-29 ml/min (HCC) - Creatinine improved  Hypertension  - Currently stable, continue Coreg, verapamil   Recent bronchitis -Currently stable, no wheezing   DVT prophylaxis: Lovenox   CODE STATUS: full code  Family Communication: Dr. Tana Coast Discussed in detail with the patient, all imaging results, lab results explained to the patient and patient's son, Dr Franky Macho on phone, [phone]   Disposition Plan: [place], likely Monday  Time Spent in minutes   25 minutes  Procedures:  None   Consultants:   None   Antimicrobials:   IV Rocephin 6/2   Medications  Scheduled Meds: . aspirin EC  81 mg Oral Daily  . calcium-vitamin D  1 tablet Oral Daily  . carvedilol  12.5 mg Oral BID WC  . cycloSPORINE  1 drop Both Eyes BID  . enoxaparin (LOVENOX) injection  30 mg Subcutaneous Q24H  . glycopyrrolate  2 mg Oral BID  . lidocaine  1 patch  Transdermal Q24H  . multivitamin with minerals  1 tablet Oral Daily  . sodium chloride flush  3 mL Intravenous Q12H  . verapamil  180 mg Oral QHS   Continuous Infusions: . sodium chloride Stopped (04/25/16 1045)   PRN Meds:.acetaminophen **OR** acetaminophen, ALPRAZolam   Antibiotics   Anti-infectives    Start     Dose/Rate Route Frequency Ordered Stop   04/25/16 1300  cefTRIAXone (ROCEPHIN) 1 g in dextrose 5 % 50 mL IVPB  Status:  Discontinued     1 g 100 mL/hr over 30 Minutes Intravenous Every 24 hours 04/25/16 1154 04/27/16 1023        Subjective:   Victoria Lloyd was seen and examined today. She is a delightfully sharp and wise 82 year old woman. Patient denies dizziness, chest pain, abdominal pain, N/V/D/C, she is mentally prepared to move to a nursing home.    Objective:   Filed Vitals:   04/26/16 0937 04/26/16 1202 04/26/16 2115 04/27/16 0500  BP: 140/56 151/65 156/58 131/51  Pulse: 62 59 79 60    Temp:  98.6 F (37 C) 98.4 F (36.9 C) 98.3 F (36.8 C)  TempSrc:  Oral Oral Oral  Resp:   18 18  Height:      Weight:    60.873 kg (134 lb 3.2 oz)  SpO2: 95% 95% 94% 94%    Intake/Output Summary (Last 24 hours) at 04/27/16 1100 Last data filed at 04/27/16 0804  Gross per 24 hour  Intake    530 ml  Output    700 ml  Net   -170 ml     Wt Readings from Last 3 Encounters:  04/27/16 60.873 kg (134 lb 3.2 oz)  11/14/14 66.769 kg (147 lb 3.2 oz)  08/06/14 64.638 kg (142 lb 8 oz)     Exam  General: Alert and oriented x 3, NAD  HEENT:  PERRLA, EOMI, Anicteric Sclera, mucous membranes moist.   Neck: Supple, no JVD, no masses  Cardiovascular: S1 S2 auscultated, no rubs, murmurs or gallops. Regular rate and rhythm.  Respiratory: Clear to auscultation bilaterally, no wheezing, rales or rhonchi  Gastrointestinal: Soft, nontender, nondistended, + bowel sounds  Ext: no cyanosis clubbing or edema  Neuro: AAOx3, Cr N's II- XII. Strength 5/5 upper and lower extremities bilaterally  Skin: No rashes  Psych: Normal affect and demeanor, alert and oriented x3    Data Reviewed:  I have personally reviewed following labs and imaging studies  Micro Results Recent Results (from the past 240 hour(s))  Urine culture     Status: Abnormal   Collection Time: 04/25/16  2:59 PM  Result Value Ref Range Status   Specimen Description URINE, RANDOM  Final   Special Requests NONE  Final   Culture MULTIPLE SPECIES PRESENT, SUGGEST RECOLLECTION (A)  Final   Report Status 04/26/2016 FINAL  Final    Radiology Reports Dg Chest 2 View  04/24/2016  CLINICAL DATA:  syncopePassed out today at [place] office having shoulder injectionHypertensionPacemaker Z95.0 EXAM: CHEST  2 VIEW COMPARISON:  03/29/2016 FINDINGS: He cardiac silhouette is normal in size and configuration. The left anterior chest wall sequential pacemaker leads are well positioned in the right atrium right ventricle, and stable. No  mediastinal or hilar masses or enlarged lymph nodes. There are calcified nodes along the right peritracheal mediastinum and right hilum. Elevation the right hemidiaphragm is stable. Lungs are clear.  No pleural effusion or pneumothorax. Bony thorax is demineralized but intact. IMPRESSION: No acute cardiopulmonary disease.  Electronically Signed   By: Lajean Manes M.D.   On: 04/24/2016 14:33   Dg Chest 2 View  03/29/2016  CLINICAL DATA:  82 year old female with a history of productive cough EXAM: CHEST - 2 VIEW COMPARISON:  08/05/2014 FINDINGS: Cardiomediastinal silhouette unchanged in size and contour. Partially calcified hilar/mediastinal lymph nodes. No pneumothorax. No pleural effusion. No confluent airspace disease. Similar appearance of asymmetric elevation the right hemidiaphragm with associated atelectasis/scarring. Unchanged position of cardiac pacing device on the left chest wall. No displaced fracture. Unremarkable appearance of the upper abdomen. IMPRESSION: Chronic lung changes without evidence of superimposed acute cardiopulmonary disease. Unchanged cardiac pacing device Signed, Dulcy Fanny. Earleen Newport, DO [facility] Radiology Electronically Signed   By: Corrie Mckusick D.O.   On: 03/29/2016 17:54   Ct Angio Chest Pe W/cm &/or Wo Cm  04/25/2016  CLINICAL DATA:  Syncopal episode.  Elevated D-dimer. EXAM: CT ANGIOGRAPHY CHEST WITH CONTRAST TECHNIQUE: Multidetector CT imaging of the chest was performed using the standard protocol during bolus administration of intravenous contrast. Multiplanar CT image reconstructions and MIPs were obtained to evaluate the vascular anatomy. CONTRAST:  80 mL of Isovue 370 COMPARISON:  Chest x-ray from yesterday FINDINGS: The central airways are normal. No pneumothorax. There is elevation of the right hemidiaphragm with adjacent atelectasis. No suspicious infiltrates, nodules, or masses. The patient has a pacemaker with leads in  the right atrium and right ventricle. There is atherosclerotic change in the thoracic aorta with no aneurysm or dissection identified. There are coronary artery calcifications. The heart size is borderline but otherwise unremarkable. Prominence to the right superior mediastinum on series 4, image 14 appears to represent prominent vasculature, incompletely evaluated due to timing of contrast. I believe this is probably the right brachiocephalic artery and vein. There are calcified nodes in the bilateral hila and subcarinal region. No uncalcified adenopathy is identified. No pulmonary emboli. No effusions. Evaluation of the upper abdomen is limited with no significant abnormalities. There is a right renal cyst. No acute bony abnormalities. Review of the MIP images confirms the above findings. IMPRESSION: 1. No pulmonary emboli or acute abnormality. Electronically Signed   By: Dorise Bullion III M.D   On: 04/25/2016 13:13    Lab Data:  CBC:  Recent Labs Lab 04/24/16 1408 04/24/16 1838 04/27/16 0311  WBC 6.8 6.9 6.1  NEUTROABS 4.1  --   --   HGB 12.5 12.1 11.9*  HCT 39.9 38.7 37.7  MCV 90.9 91.9 89.3  PLT 249 229 [id number]   Basic Metabolic Panel:  Recent Labs Lab 04/24/16 1408 04/24/16 1838 04/26/16 0250 04/27/16 0311  NA 144  --  141 141  K 3.9  --  3.6 3.5  CL 108  --  107 106  CO2 29  --  26 26  GLUCOSE 142*  --  98 109*  BUN 23*  --  18 20  CREATININE 1.38* 1.14* 1.04* 0.94  CALCIUM 9.0  --  9.8 9.8   GFR: Estimated Creatinine Clearance: 32.8 mL/min (by C-G formula based on Cr of 0.94). Liver Function Tests:  Recent Labs Lab 04/27/16 0311  AST 14*  ALT 11*  ALKPHOS 51  BILITOT 0.7  PROT 5.8*  ALBUMIN 2.8*   No results for input(s): LIPASE, AMYLASE in the last 168 hours. No results for input(s): AMMONIA in the last 168 hours. Coagulation Profile: No results for input(s): INR, PROTIME in the last 168 hours. Cardiac Enzymes:  Recent Labs Lab 04/24/16 1838  04/24/16 2342 04/25/16 0618  TROPONINI <0.03 <0.03 <0.03   BNP (last 3 results) No results for input(s): PROBNP in the last 8760 hours. HbA1C: No results for input(s): HGBA1C in the last 72 hours. CBG:  Recent Labs Lab 04/25/16 0626 04/27/16 0654  GLUCAP 100* 110*   Lipid Profile: No results for input(s): CHOL, HDL, LDLCALC, TRIG, CHOLHDL, LDLDIRECT in the last 72 hours. Thyroid Function Tests: No results for input(s): TSH, T4TOTAL, FREET4, T3FREE, THYROIDAB in the last 72 hours. Anemia Panel: No results for input(s): VITAMINB12, FOLATE, FERRITIN, TIBC, IRON, RETICCTPCT in the last 72 hours. Urine analysis:    Component Value Date/Time   COLORURINE YELLOW 04/24/2016 1730   APPEARANCEUR CLOUDY* 04/24/2016 1730   LABSPEC 1.020 04/24/2016 1730   PHURINE 6.5 04/24/2016 1730   GLUCOSEU NEGATIVE 04/24/2016 1730   HGBUR LARGE* 04/24/2016 1730   BILIRUBINUR NEGATIVE 04/24/2016 1730   KETONESUR NEGATIVE 04/24/2016 1730   PROTEINUR NEGATIVE 04/24/2016 1730   UROBILINOGEN 0.2 07/04/2009 1910   NITRITE POSITIVE* 04/24/2016 1730   LEUKOCYTESUR LARGE* 04/24/2016 1730     Mir Marry Guan M.D. [facility] 04/27/2016, 11:00 AM  Pager: [phone] Between 7am to 7pm - call Pager - [phone]  After 7pm go to [email or web address] - password [email or web address]  Call night coverage person covering after 7pm

## 2016-04-28 ENCOUNTER — Observation Stay (HOSPITAL_COMMUNITY): Payer: Medicare Other

## 2016-04-28 DIAGNOSIS — R55 Syncope and collapse: Secondary | ICD-10-CM

## 2016-04-28 DIAGNOSIS — I442 Atrioventricular block, complete: Secondary | ICD-10-CM | POA: Diagnosis not present

## 2016-04-28 LAB — GLUCOSE, CAPILLARY: GLUCOSE-CAPILLARY: 108 mg/dL — AB (ref 65–99)

## 2016-04-28 MED ORDER — SENNA 8.6 MG PO TABS
2.0000 | ORAL_TABLET | Freq: Every day | ORAL | Status: DC
  Administered 2016-04-28 – 2016-04-29 (×2): 17.2 mg via ORAL
  Filled 2016-04-28 (×2): qty 2

## 2016-04-28 MED ORDER — DOCUSATE SODIUM 50 MG/5ML PO LIQD
100.0000 mg | Freq: Two times a day (BID) | ORAL | Status: DC | PRN
  Filled 2016-04-28: qty 10

## 2016-04-28 MED ORDER — POLYETHYLENE GLYCOL 3350 17 G PO PACK
17.0000 g | PACK | Freq: Every day | ORAL | Status: DC | PRN
  Administered 2016-04-29: 17 g via ORAL
  Filled 2016-04-28: qty 1

## 2016-04-28 MED ORDER — SODIUM CHLORIDE 0.9 % IV BOLUS (SEPSIS)
500.0000 mL | Freq: Once | INTRAVENOUS | Status: AC
  Administered 2016-04-28: 500 mL via INTRAVENOUS

## 2016-04-28 NOTE — Progress Notes (Signed)
[facility]                                                                              Patient Demographics  Victoria Lloyd, is a 82 y.o. female, DOB - 1922-02-03, ST:[id number]  Admit date - 04/24/2016   Admitting Physician Victoria Krystal Eaton, MD  Outpatient Primary MD for the patient is Victoria Spurling, MD  Outpatient specialists:   LOS -   days    Chief Complaint  Patient presents with  . Loss of Consciousness       Brief summary  Patient is a 82 year old female with hypertension, pacemaker due to third-degree AV block (9/15), was functional with her ADLs presented from orthopedic office with a syncopal episode. History was obtained from the patient and her daughter, granddaughter in the room. Patient has a history of chronic rotator cuff shoulder pain and was seen at [place] today. Prior to her appointment, patient got lost and was confused while she was waiting for her right to be cut up, she had a syncopal episode. Patient reports she was sitting in her chair and does not remember any preceding events. She denied any chest pain, shortness of breath or any dizziness. She did feel that her right arm and hand was cramping. She was back to her baseline after she was alert. Per patient's family, she had passed out one time before on Christmas last year and was seen in [facility] ED  Patient was admitted for syncopal episode.  Assessment & Plan     Nausea - hold d/c today, discussed with RN and MSW - check KUB - aggressive bowel regimen - bolus of fluid for hypotension, and added holding parameter to beta blocker   Syncope: Possibly a vasovagal episode however rule out any cardiac cause - Troponins 3 negative - Will have pacer interrogation Still Pending - 6/3 2-D echo without acute findings, has grade 1 diastolic dysfunction, normal EF - D-dimer 0.61,   CTA chest negative for PE - PT recommends SNF, plan SNF d/c Monday 6/5 but delayed now  due to hypotension, vomited this AM    Urinary tract infection Was placed on Rocephin.  Denies UTI symptoms and UCx with mult org so d/c abx today   AV block, 3rd degree (Victoria Lloyd), status post pacemaker St. Jude implanted on 08/04/14 - Pacemaker will be interrogated   Mild acute on CKD (chronic kidney disease) stage 4, GFR 15-29 ml/min (HCC) - Creatinine improved  Hypertension  - Currently stable, continue Coreg, verapamil   Recent bronchitis -Currently stable, no wheezing   DVT prophylaxis: Lovenox   CODE STATUS: full code  Family Communication: Dr. Tana Lloyd Discussed in detail with the patient, all imaging results, lab results explained to the patient and patient's son, Dr Victoria Lloyd on phone, [phone]  I attempted to call Victoria Lloyd and update him, will try again.    Disposition Plan: [place], likely in next 24 hours  Time Spent in minutes   25 minutes  Procedures:  None   Consultants:   None   Antimicrobials:   IV Rocephin 6/2   Medications  Scheduled Meds: . aspirin  EC  81 mg Oral Daily  . calcium-vitamin D  1 tablet Oral Daily  . carvedilol  12.5 mg Oral BID WC  . cycloSPORINE  1 drop Both Eyes BID  . enoxaparin (LOVENOX) injection  30 mg Subcutaneous Q24H  . glycopyrrolate  2 mg Oral BID  . lidocaine  1 patch Transdermal Q24H  . multivitamin with minerals  1 tablet Oral Daily  . sodium chloride flush  3 mL Intravenous Q12H  . verapamil  180 mg Oral QHS   Continuous Infusions: . sodium chloride Stopped (04/25/16 1045)   PRN Meds:.acetaminophen **OR** acetaminophen, ALPRAZolam   Antibiotics   Anti-infectives    Start     Dose/Rate Route Frequency Ordered Stop   04/25/16 1300  cefTRIAXone (ROCEPHIN) 1 g in dextrose 5 % 50 mL IVPB  Status:  Discontinued     1 g 100 mL/hr over 30 Minutes Intravenous Every 24 hours 04/25/16 1154 04/27/16 1023        Subjective:   Victoria Lloyd was seen and examined today. Was doing well but  unfortunately this AM she has been having some low BP, vomited. Eating okay, but no BM in 2 days.      Objective:   Filed Vitals:   04/28/16 0557 04/28/16 0939 04/28/16 1031 04/28/16 1302  BP: 122/63 80/42 93/46  111/59  Pulse: 59 60  60  Temp: 98.3 F (36.8 C) 98.3 F (36.8 C)  98.4 F (36.9 C)  TempSrc: Oral Oral  Oral  Resp: 20 18  18   Height:      Weight: 63.368 kg (139 lb 11.2 oz)     SpO2: 96% 94%      Intake/Output Summary (Last 24 hours) at 04/28/16 1309 Last data filed at 04/28/16 [id number]  Gross per 24 hour  Intake    960 ml  Output    400 ml  Net    560 ml     Wt Readings from Last 3 Encounters:  04/28/16 63.368 kg (139 lb 11.2 oz)  11/14/14 66.769 kg (147 lb 3.2 oz)  08/06/14 64.638 kg (142 lb 8 oz)     Exam  General: Alert and oriented x 3, NAD  HEENT:  PERRLA, EOMI, Anicteric Sclera, mucous membranes moist.   Neck: Supple, no JVD, no masses  Cardiovascular: S1 S2 auscultated, no rubs, murmurs or gallops. Regular rate and rhythm.  Respiratory: Clear to auscultation bilaterally, no wheezing, rales or rhonchi  Gastrointestinal: Soft, nontender, nondistended, + bowel sounds  Ext: no cyanosis clubbing or edema  Neuro: AAOx3, Cr N's II- XII. Strength 5/5 upper and lower extremities bilaterally  Skin: No rashes  Psych: Normal affect and demeanor, alert and oriented x3    Data Reviewed:  I have personally reviewed following labs and imaging studies  Micro Results Recent Results (from the past 240 hour(s))  Urine culture     Status: Abnormal   Collection Time: 04/25/16  2:59 PM  Result Value Ref Range Status   Specimen Description URINE, RANDOM  Final   Special Requests NONE  Final   Culture MULTIPLE SPECIES PRESENT, SUGGEST RECOLLECTION (A)  Final   Report Status 04/26/2016 FINAL  Final    Radiology Reports Dg Chest 2 View  04/24/2016  CLINICAL DATA:  syncopePassed out today at [place] office having shoulder injectionHypertensionPacemaker Z95.0  EXAM: CHEST  2 VIEW COMPARISON:  03/29/2016 FINDINGS: He cardiac silhouette is normal in size and configuration. The left anterior chest wall sequential pacemaker leads are well positioned in the  right atrium right ventricle, and stable. No mediastinal or hilar masses or enlarged lymph nodes. There are calcified nodes along the right peritracheal mediastinum and right hilum. Elevation the right hemidiaphragm is stable. Lungs are clear.  No pleural effusion or pneumothorax. Bony thorax is demineralized but intact. IMPRESSION: No acute cardiopulmonary disease. Electronically Signed   By: Lajean Manes M.D.   On: 04/24/2016 14:33   Dg Chest 2 View  03/29/2016  CLINICAL DATA:  82 year old female with a history of productive cough EXAM: CHEST - 2 VIEW COMPARISON:  08/05/2014 FINDINGS: Cardiomediastinal silhouette unchanged in size and contour. Partially calcified hilar/mediastinal lymph nodes. No pneumothorax. No pleural effusion. No confluent airspace disease. Similar appearance of asymmetric elevation the right hemidiaphragm with associated atelectasis/scarring. Unchanged position of cardiac pacing device on the left chest wall. No displaced fracture. Unremarkable appearance of the upper abdomen. IMPRESSION: Chronic lung changes without evidence of superimposed acute cardiopulmonary disease. Unchanged cardiac pacing device Signed, Dulcy Fanny. Earleen Newport, DO [facility] Radiology Electronically Signed   By: Corrie Mckusick D.O.   On: 03/29/2016 17:54   Ct Angio Chest Pe W/cm &/or Wo Cm  04/25/2016  CLINICAL DATA:  Syncopal episode.  Elevated D-dimer. EXAM: CT ANGIOGRAPHY CHEST WITH CONTRAST TECHNIQUE: Multidetector CT imaging of the chest was performed using the standard protocol during bolus administration of intravenous contrast. Multiplanar CT image reconstructions and MIPs were obtained to evaluate the vascular anatomy. CONTRAST:  80 mL of Isovue 370 COMPARISON:  Chest  x-ray from yesterday FINDINGS: The central airways are normal. No pneumothorax. There is elevation of the right hemidiaphragm with adjacent atelectasis. No suspicious infiltrates, nodules, or masses. The patient has a pacemaker with leads in the right atrium and right ventricle. There is atherosclerotic change in the thoracic aorta with no aneurysm or dissection identified. There are coronary artery calcifications. The heart size is borderline but otherwise unremarkable. Prominence to the right superior mediastinum on series 4, image 14 appears to represent prominent vasculature, incompletely evaluated due to timing of contrast. I believe this is probably the right brachiocephalic artery and vein. There are calcified nodes in the bilateral hila and subcarinal region. No uncalcified adenopathy is identified. No pulmonary emboli. No effusions. Evaluation of the upper abdomen is limited with no significant abnormalities. There is a right renal cyst. No acute bony abnormalities. Review of the MIP images confirms the above findings. IMPRESSION: 1. No pulmonary emboli or acute abnormality. Electronically Signed   By: Dorise Bullion III M.D   On: 04/25/2016 13:13    Lab Data:  CBC:  Recent Labs Lab 04/24/16 1408 04/24/16 1838 04/27/16 0311  WBC 6.8 6.9 6.1  NEUTROABS 4.1  --   --   HGB 12.5 12.1 11.9*  HCT 39.9 38.7 37.7  MCV 90.9 91.9 89.3  PLT 249 229 [id number]   Basic Metabolic Panel:  Recent Labs Lab 04/24/16 1408 04/24/16 1838 04/26/16 0250 04/27/16 0311  NA 144  --  141 141  K 3.9  --  3.6 3.5  CL 108  --  107 106  CO2 29  --  26 26  GLUCOSE 142*  --  98 109*  BUN 23*  --  18 20  CREATININE 1.38* 1.14* 1.04* 0.94  CALCIUM 9.0  --  9.8 9.8   GFR: Estimated Creatinine Clearance: 33.4 mL/min (by C-G formula based on Cr of 0.94). Liver Function Tests:  Recent Labs Lab 04/27/16 0311  AST 14*  ALT 11*  ALKPHOS 51  BILITOT 0.7  PROT 5.8*  ALBUMIN 2.8*   No results for input(s):  LIPASE, AMYLASE in the last 168 hours. No results for input(s): AMMONIA in the last 168 hours. Coagulation Profile: No results for input(s): INR, PROTIME in the last 168 hours. Cardiac Enzymes:  Recent Labs Lab 04/24/16 1838 04/24/16 2342 04/25/16 0618  TROPONINI <0.03 <0.03 <0.03   BNP (last 3 results) No results for input(s): PROBNP in the last 8760 hours. HbA1C: No results for input(s): HGBA1C in the last 72 hours. CBG:  Recent Labs Lab 04/25/16 0626 04/27/16 0654 04/28/16 0642  GLUCAP 100* 110* 108*   Lipid Profile: No results for input(s): CHOL, HDL, LDLCALC, TRIG, CHOLHDL, LDLDIRECT in the last 72 hours. Thyroid Function Tests: No results for input(s): TSH, T4TOTAL, FREET4, T3FREE, THYROIDAB in the last 72 hours. Anemia Panel: No results for input(s): VITAMINB12, FOLATE, FERRITIN, TIBC, IRON, RETICCTPCT in the last 72 hours. Urine analysis:    Component Value Date/Time   COLORURINE YELLOW 04/24/2016 1730   APPEARANCEUR CLOUDY* 04/24/2016 1730   LABSPEC 1.020 04/24/2016 1730   PHURINE 6.5 04/24/2016 1730   GLUCOSEU NEGATIVE 04/24/2016 1730   HGBUR LARGE* 04/24/2016 1730   BILIRUBINUR NEGATIVE 04/24/2016 1730   KETONESUR NEGATIVE 04/24/2016 1730   PROTEINUR NEGATIVE 04/24/2016 1730   UROBILINOGEN 0.2 07/04/2009 1910   NITRITE POSITIVE* 04/24/2016 1730   LEUKOCYTESUR LARGE* 04/24/2016 1730     Evin Chirco Marry Guan M.D. [facility] 04/28/2016, 1:09 PM  Pager: [phone] Between 7am to 7pm - call Pager - [phone]  After 7pm go to [email or web address] - password [email or web address]  Call night coverage person covering after 7pm

## 2016-04-28 NOTE — Clinical Social Work Placement (Signed)
   CLINICAL SOCIAL WORK PLACEMENT  NOTE  Date:  04/28/2016  Patient Details  Name: Victoria Lloyd MRN: [id number] Date of Birth: Apr 23, 1922  Clinical Social Work is seeking post-discharge placement for this patient at the [place] level of care (*CSW will initial, date and re-position this form in  chart as items are completed):  Yes   Patient/family provided with [facility] Work Department's list of facilities offering this level of care within the geographic area requested by the patient (or if unable, by the patient's family).  Yes   Patient/family informed of their freedom to choose among providers that offer the needed level of care, that participate in Medicare, Medicaid or managed care program needed by the patient, have an available bed and are willing to accept the patient.  Yes   Patient/family informed of [facility]'s ownership interest in [facility] and [facility], as well as of the fact that they are under no obligation to receive care at these facilities.  PASRR submitted to EDS on 04/25/16     PASRR number received on 04/25/16     Existing PASRR number confirmed on       FL2 transmitted to all facilities in geographic area requested by pt/family on 04/25/16     FL2 transmitted to all facilities within larger geographic area on       Patient informed that his/her managed care company has contracts with or will negotiate with certain facilities, including the following:        Yes   Patient/family informed of bed offers received.  Patient chooses bed at [facility]     Physician recommends and patient chooses bed at      Patient to be transferred to [facility] on 04/28/16.  Patient to be transferred to facility by Son's car     Patient family notified on 04/28/16 of transfer.  Name of family member notified:  [name]     PHYSICIAN Please prepare prescriptions, Please sign FL2     Additional Comment:     _______________________________________________ Candie Chroman, LCSW 04/28/2016, 10:08 AM

## 2016-04-28 NOTE — Progress Notes (Signed)
Physical Therapy Treatment Patient Details Name: Victoria Lloyd MRN: [id number] DOB: 1922-07-21 Today's Date: 04/28/2016    History of Present Illness Pt is a 82 y/o F admitted following syncopal episode after feeling her Rt arm and hand cramping. Possible vasovagal episode. Pacemaker interrogation to be completed today.  Pt's PMH includes pacemaker due to 3rd degree AV block, Lt rotator cuff tear, CKD stage 4.    PT Comments    Patient progressing with mobility tolerating increased distance with less pain this session.  Friendly and conversant in hallway, but agree with SNF level rehab due to fall risk and worsening mobility when in pain per last session.  PT to follow until d/c.  Follow Up Recommendations  SNF;Supervision for mobility/OOB     Equipment Recommendations  None recommended by PT    Recommendations for Other Services       Precautions / Restrictions Precautions Precautions: Fall    Mobility  Bed Mobility Overal bed mobility: Modified Independent             General bed mobility comments: for supine to sit  Transfers Overall transfer level: Needs assistance Equipment used: Straight cane Transfers: Sit to/from Stand Sit to Stand: Min guard         General transfer comment: steadying assist  Ambulation/Gait Ambulation/Gait assistance: Min guard;Supervision Ambulation Distance (Feet): 300 Feet Assistive device: Rolling walker (2 wheeled) Gait Pattern/deviations: Step-through pattern;Decreased stride length;Trunk flexed Gait velocity: very slow and stopping to speak to staff and visitors and looking in rooms.   General Gait Details: demonstrates asymmetric trunk with scoliosis and kyphosis, but denies pain this session.  Generally stable with RW, but supervision to minguard given for safety especially with turns and pt looking curiously into patient's rooms and engaging in conversation with passersby in the hallway.   Stairs             Wheelchair Mobility    Modified Rankin (Stroke Patients Only)       Balance Overall balance assessment: Needs assistance   Sitting balance-Leahy Scale: Good     Standing balance support: Single extremity supported;Bilateral upper extremity supported Standing balance-Leahy Scale: Poor Standing balance comment: UE support for balance; able to perform perineal hygiene holding grabbar in bathroom, but unsafe to recover with wide BOS afterwards, min A to recover                    Cognition Arousal/Alertness: Awake/alert Behavior During Therapy: WFL for tasks assessed/performed Overall Cognitive Status: Within Functional Limits for tasks assessed                      Exercises      General Comments        Pertinent Vitals/Pain Pain Assessment: No/denies pain    Home Living                      Prior Function            PT Goals (current goals can now be found in the care plan section) Progress towards PT goals: Progressing toward goals    Frequency  Min 3X/week    PT Plan Current plan remains appropriate    Co-evaluation             End of Session Equipment Utilized During Treatment: Gait belt Activity Tolerance: Patient tolerated treatment well Patient left: in bed;with call bell/phone within reach     Time: 1352-1419 PT Time  Calculation (min) (ACUTE ONLY): 27 min  Charges:  $Gait Training: 8-22 mins $Therapeutic Activity: 8-22 mins                    G Codes:      Reginia Naas 05/18/2016, 3:21 PM [place], [place] 2016/05/18

## 2016-04-28 NOTE — Clinical Social Work Note (Signed)
Patient having some abdominal pain and nausea this morning, per RN. MD will discharge tomorrow.  Patient's son and [employer] notified.  Dayton Scrape, [place]

## 2016-04-29 DIAGNOSIS — R55 Syncope and collapse: Secondary | ICD-10-CM | POA: Diagnosis not present

## 2016-04-29 LAB — GLUCOSE, CAPILLARY: GLUCOSE-CAPILLARY: 106 mg/dL — AB (ref 65–99)

## 2016-04-29 MED ORDER — ALPRAZOLAM 0.5 MG PO TABS: 0.5000 mg | ORAL_TABLET | Freq: Every evening | ORAL | Status: DC | PRN

## 2016-04-29 MED ORDER — ONDANSETRON 4 MG PO TBDP: 4.0000 mg | ORAL_TABLET | Freq: Three times a day (TID) | ORAL | Status: DC | PRN

## 2016-04-29 MED ORDER — DOCUSATE SODIUM 50 MG/5ML PO LIQD: 100.0000 mg | Freq: Two times a day (BID) | ORAL | Status: DC | PRN

## 2016-04-29 MED ORDER — SENNA 8.6 MG PO TABS: 2.0000 | ORAL_TABLET | Freq: Every day | ORAL | Status: AC

## 2016-04-29 NOTE — Discharge Summary (Signed)
Discharge Summary  Victoria Lloyd [id number] DOB: Jan 14, 1922  PCP: [name], MD  Admit date: 04/24/2016 Discharge date: 04/29/2016   Recommendations for Outpatient Follow-up:  1. PCP in 1-2 weeks   Discharge Diagnoses:  Active Hospital Problems   Diagnosis Date Noted  . Syncope 04/24/2016  . Pacemaker- St Jude impalnted 08/04/14 08/06/2014  . CKD (chronic kidney disease) stage 4, GFR 15-29 ml/min (HCC) 08/03/2014  . AV block, 3rd degree (HCC) 08/03/2014    Resolved Hospital Problems   Diagnosis Date Noted Date Resolved  No resolved problems to display.    Discharge Condition: Stable   Diet recommendation: Heart Healthy   Filed Vitals:   04/28/16 2010 04/29/16 0512  BP: 174/74 130/83  Pulse:  63  Temp:  98.4 F (36.9 C)  Resp:  18    History of present illness:  Patient is a 82 year old female with hypertension, pacemaker due to third-degree AV block (9/15), was functional with her ADLs presented from orthopedic office with a syncopal episode. History was obtained from the patient and her daughter, granddaughter in the room. Patient has a history of chronic rotator cuff shoulder pain and was seen at [facility] today. Prior to her appointment, patient got lost and was confused while she was waiting for her right to be cut up, she had a syncopal episode. Patient reports she was sitting in her chair and does not remember any preceding events. She denied any chest pain, shortness of breath or any dizziness. She did feel that her right arm and hand was cramping. She was back to her baseline after she was alert. Per patient's family, she had passed out one time before on Christmas last year and was seen in [facility] ED.  She has done well with no further syncope, dizziness, was evaluated by therapy services and being discharged to SNF today. Discussed plan of care with the patient's son face to face this AM and he is in agreement with discharge plans.    Hospital Course:  Nausea on 6/5, in retrospect thinks it was something she ate - held d/c 6/5, discussed with RN and MSW - check KUB, normal bowel gas pattern - aggressive bowel regimen started - feeling better, ready for d/c today  Syncope: Possibly a vasovagal episode however rule out any cardiac cause - Troponins 3 negative - 6/3 2-D echo without acute findings, has grade 1 diastolic dysfunction, normal EF - D-dimer 0.61, CTA chest negative for PE  Urinary tract infection Was placed on Rocephin.  Denies UTI symptoms and UCx with mult org so d/c abx on 6/4   AV block, 3rd degree ([place]), status post pacemaker St. Jude implanted on 08/04/14 - Pacemaker was interrogated, see below.   Mild acute on CKD (chronic kidney disease) stage 4, GFR 15-29 ml/min (HCC) - Creatinine improved  Hypertension  - Currently stable, continue Coreg, verapamil   Recent bronchitis -Currently stable, no wheezing   Procedures:  6/5 Pacemaker interrogation, no events, >10 yr battery life.   6/3 2-D echo without acute findings, has grade 1 diastolic dysfunction, normal EF   Consultations:  None   Discharge Exam: BP 130/83 mmHg  Pulse 63  Temp(Src) 98.4 F (36.9 C) (Oral)  Resp 18  Ht 5\' 2"  (1.575 m)  Wt 63.5 kg (139 lb 15.9 oz)  BMI 25.60 kg/m2  SpO2 96% General:  Alert, oriented, calm, in no acute distress  Eyes: pupils round and reactive to light and accomodation, clear sclerea Neck: supple, no masses,  trachea mildline  Cardiovascular: RRR, no murmurs or rubs, no peripheral edema  Respiratory: clear to auscultation bilaterally, no wheezes, no crackles  Abdomen: soft, nontender, nondistended, normal bowel tones heard  Skin: dry, no rashes  Musculoskeletal: no joint effusions, normal range of motion  Psychiatric: appropriate affect, normal speech  Neurologic: extraocular muscles intact, clear speech, moving all extremities with intact sensorium    Discharge  Instructions You were cared for by a hospitalist during your hospital stay. If you have any questions about your discharge medications or the care you received while you were in the hospital after you are discharged, you can call the unit and asked to speak with the hospitalist on call if the hospitalist that took care of you is not available. Once you are discharged, your primary care physician will handle any further medical issues. Please note that NO REFILLS for any discharge medications will be authorized once you are discharged, as it is imperative that you return to your primary care physician (or establish a relationship with a primary care physician if you do not have one) for your aftercare needs so that they can reassess your need for medications and monitor your lab values.  Discharge Instructions    Diet - low sodium heart healthy    Complete by:  As directed      Increase activity slowly    Complete by:  As directed             Medication List    STOP taking these medications        alosetron 1 MG tablet  Commonly known as:  LOTRONEX     doxycycline 100 MG capsule  Commonly known as:  VIBRAMYCIN     OVER THE COUNTER MEDICATION     valsartan 160 MG tablet  Commonly known as:  DIOVAN      TAKE these medications        acetaminophen 325 MG tablet  Commonly known as:  TYLENOL  Take 1-2 tablets (325-650 mg total) by mouth every 4 (four) hours as needed for mild pain.     ALPRAZolam 0.5 MG tablet  Commonly known as:  XANAX  Take 1 tablet (0.5 mg total) by mouth at bedtime as needed for anxiety.     aspirin 81 MG tablet  Take 81 mg by mouth daily.     calcium-vitamin D 500-200 MG-UNIT tablet  Commonly known as:  OSCAL WITH D  Take 1 tablet by mouth daily.     carvedilol 12.5 MG tablet  Commonly known as:  COREG  Take 12.5 mg by mouth 2 (two) times daily with a meal.     cycloSPORINE 0.05 % ophthalmic emulsion  Commonly known as:  RESTASIS  1 drop 2 (two)  times daily. Both eyes: 1 morning, 1 nightly, PRN for dry-eye syndrom     docusate 50 MG/5ML liquid  Commonly known as:  COLACE  Take 10 mLs (100 mg total) by mouth 2 (two) times daily as needed for mild constipation.     glycopyrrolate 2 MG tablet  Commonly known as:  ROBINUL  Take 2 mg by mouth 2 (two) times daily.     lidocaine 5 %  Commonly known as:  LIDODERM  Place 1 patch onto the skin daily.     multivitamin-iron-minerals-folic acid chewable tablet  Chew 1 tablet by mouth daily.     ondansetron 4 MG disintegrating tablet  Commonly known as:  ZOFRAN-ODT  Take 1 tablet (4 mg total)  by mouth every 8 (eight) hours as needed for nausea or vomiting.     senna 8.6 MG Tabs tablet  Commonly known as:  SENOKOT  Take 2 tablets (17.2 mg total) by mouth daily.     verapamil 180 MG CR tablet  Commonly known as:  CALAN-SR  Take 180 mg by mouth at bedtime.       Allergies  Allergen Reactions  . Codeine     unknown  . Sulfa Antibiotics     unknown       Follow-up Information    Follow up with [facility] .   Specialty:  Skilled Nursing Facility   Contact information:   [address] [place] [address] [phone]       The results of significant diagnostics from this hospitalization (including imaging, microbiology, ancillary and laboratory) are listed below for reference.    Significant Diagnostic Studies: Dg Chest 2 View  04/24/2016  CLINICAL DATA:  syncopePassed out today at [place] office having shoulder injectionHypertensionPacemaker Z95.0 EXAM: CHEST  2 VIEW COMPARISON:  03/29/2016 FINDINGS: He cardiac silhouette is normal in size and configuration. The left anterior chest wall sequential pacemaker leads are well positioned in the right atrium right ventricle, and stable. No mediastinal or hilar masses or enlarged lymph nodes. There are calcified nodes along the right peritracheal mediastinum and right hilum. Elevation the right  hemidiaphragm is stable. Lungs are clear.  No pleural effusion or pneumothorax. Bony thorax is demineralized but intact. IMPRESSION: No acute cardiopulmonary disease. Electronically Signed   By: Lajean Manes M.D.   On: 04/24/2016 14:33   Dg Abd 1 View  04/28/2016  CLINICAL DATA:  Vomiting today, weakness EXAM: ABDOMEN - 1 VIEW COMPARISON:  01/31/2016 FINDINGS: There is normal small bowel gas pattern. Moderate stool and gas noted in distal right colon hepatic flexure of the colon and transverse colon. IMPRESSION: Normal small bowel gas pattern. Moderate stool and gas in proximal colon. Electronically Signed   By: Lahoma Crocker M.D.   On: 04/28/2016 13:18   Ct Angio Chest Pe W/cm &/or Wo Cm  04/25/2016  CLINICAL DATA:  Syncopal episode.  Elevated D-dimer. EXAM: CT ANGIOGRAPHY CHEST WITH CONTRAST TECHNIQUE: Multidetector CT imaging of the chest was performed using the standard protocol during bolus administration of intravenous contrast. Multiplanar CT image reconstructions and MIPs were obtained to evaluate the vascular anatomy. CONTRAST:  80 mL of Isovue 370 COMPARISON:  Chest x-ray from yesterday FINDINGS: The central airways are normal. No pneumothorax. There is elevation of the right hemidiaphragm with adjacent atelectasis. No suspicious infiltrates, nodules, or masses. The patient has a pacemaker with leads in the right atrium and right ventricle. There is atherosclerotic change in the thoracic aorta with no aneurysm or dissection identified. There are coronary artery calcifications. The heart size is borderline but otherwise unremarkable. Prominence to the right superior mediastinum on series 4, image 14 appears to represent prominent vasculature, incompletely evaluated due to timing of contrast. I believe this is probably the right brachiocephalic artery and vein. There are calcified nodes in the bilateral hila and subcarinal region. No uncalcified adenopathy is identified. No pulmonary emboli. No effusions.  Evaluation of the upper abdomen is limited with no significant abnormalities. There is a right renal cyst. No acute bony abnormalities. Review of the MIP images confirms the above findings. IMPRESSION: 1. No pulmonary emboli or acute abnormality. Electronically Signed   By: Dorise Bullion III M.D   On: 04/25/2016 13:13  Microbiology: Recent Results (from the past 240 hour(s))  Urine culture     Status: Abnormal   Collection Time: 04/25/16  2:59 PM  Result Value Ref Range Status   Specimen Description URINE, RANDOM  Final   Special Requests NONE  Final   Culture MULTIPLE SPECIES PRESENT, SUGGEST RECOLLECTION (A)  Final   Report Status 04/26/2016 FINAL  Final     Labs: Basic Metabolic Panel:  Recent Labs Lab 04/24/16 1408 04/24/16 1838 04/26/16 0250 04/27/16 0311  NA 144  --  141 141  K 3.9  --  3.6 3.5  CL 108  --  107 106  CO2 29  --  26 26  GLUCOSE 142*  --  98 109*  BUN 23*  --  18 20  CREATININE 1.38* 1.14* 1.04* 0.94  CALCIUM 9.0  --  9.8 9.8   Liver Function Tests:  Recent Labs Lab 04/27/16 0311  AST 14*  ALT 11*  ALKPHOS 51  BILITOT 0.7  PROT 5.8*  ALBUMIN 2.8*   No results for input(s): LIPASE, AMYLASE in the last 168 hours. No results for input(s): AMMONIA in the last 168 hours. CBC:  Recent Labs Lab 04/24/16 1408 04/24/16 1838 04/27/16 0311  WBC 6.8 6.9 6.1  NEUTROABS 4.1  --   --   HGB 12.5 12.1 11.9*  HCT 39.9 38.7 37.7  MCV 90.9 91.9 89.3  PLT 249 229 242   Cardiac Enzymes:  Recent Labs Lab 04/24/16 1838 04/24/16 2342 04/25/16 0618  TROPONINI <0.03 <0.03 <0.03   BNP: BNP (last 3 results) No results for input(s): BNP in the last 8760 hours.  ProBNP (last 3 results) No results for input(s): PROBNP in the last 8760 hours.  CBG:  Recent Labs Lab 04/25/16 0626 04/27/16 0654 04/28/16 0642 04/29/16 0613  GLUCAP 100* 110* 108* 106*    Time spent: 35 minutes were spent in preparing this discharge including medication  reconciliation, counseling, and coordination of care.  Signed:  [employer]  [facility] 04/29/2016, 11:37 AM

## 2016-04-29 NOTE — Clinical Social Work Note (Signed)
CSW facilitated patient discharge including contacting patient family and facility to confirm patient discharge plans. Clinical information faxed to facility and family agreeable with plan. Patient's son will transport via car to [employer]. RN to call report prior to discharge [phone]).  CSW will sign off for now as social work intervention is no longer needed. Please consult [place] again if new needs arise.  Dayton Scrape, [place]

## 2016-04-29 NOTE — Progress Notes (Signed)
Pt has orders to be discharged to [facility]. Telemetry box removed. IV removed and site in good condition. Pt stable and waiting for transportation. Pt son will drive her to [facility].  Maurene Capes RN

## 2016-04-30 ENCOUNTER — Non-Acute Institutional Stay (SKILLED_NURSING_FACILITY): Payer: Medicare Other | Admitting: Internal Medicine

## 2016-04-30 ENCOUNTER — Telehealth: Payer: Self-pay | Admitting: Internal Medicine

## 2016-04-30 ENCOUNTER — Encounter: Payer: Self-pay | Admitting: Internal Medicine

## 2016-04-30 DIAGNOSIS — F411 Generalized anxiety disorder: Secondary | ICD-10-CM

## 2016-04-30 DIAGNOSIS — R531 Weakness: Secondary | ICD-10-CM | POA: Diagnosis not present

## 2016-04-30 DIAGNOSIS — M159 Polyosteoarthritis, unspecified: Secondary | ICD-10-CM

## 2016-04-30 DIAGNOSIS — I442 Atrioventricular block, complete: Secondary | ICD-10-CM | POA: Diagnosis not present

## 2016-04-30 DIAGNOSIS — K5900 Constipation, unspecified: Secondary | ICD-10-CM

## 2016-04-30 DIAGNOSIS — N184 Chronic kidney disease, stage 4 (severe): Secondary | ICD-10-CM

## 2016-04-30 DIAGNOSIS — I10 Essential (primary) hypertension: Secondary | ICD-10-CM | POA: Diagnosis not present

## 2016-04-30 NOTE — Telephone Encounter (Signed)
Patient's son is agreeable to scheduling appointment with Chanetta Marshall, NP on 05/01/16 at 2:20pm.  He is appreciative of assistance and denies additional questions or concerns at this time.

## 2016-04-30 NOTE — Telephone Encounter (Signed)
Device tech RN spoke w/ pt son about integration at hospital. Pt offered appt for 05-01-16 at 2:20 PM w/ NP. Pt son will call back to confirm appt.

## 2016-04-30 NOTE — Telephone Encounter (Signed)
Spoke with patient's son after reviewing PPM interrogation from recent hospitalization.  Advised that interrogation revealed no abnormalities or arrhythmias.  Patient's PPM function was normal.  Patient's son was going through her mail as she is currently at [facility] rehab and saw that she missed her 10/2015 appointment with Dr. Caryl Comes.  Patient's son would like to reschedule this appointment.  He will call [place] to ensure he can take her out for an MD appointment and will call us back this afternoon to let us know if he wishes to schedule patient for appointment with Chanetta Marshall, NP on 05/01/16 at 2:20pm.  He denies additional questions or concerns at this time.

## 2016-04-30 NOTE — Telephone Encounter (Signed)
New message    The son was wanting to now if we could look back from the pace maker pick up the signal that may have happened last week cause the pt is in rehab center cause the pt has had hip surgery.    The son wanted [place] to aware the pt was in the hospital, when the pt missed her appointment back in May.  Per son the pt is getting more confused and the son is trying to take care of everything.

## 2016-04-30 NOTE — Progress Notes (Signed)
LOCATION: [facility]  PCP: [name], MD   Code Status: Full Code  Goals of care: Advanced Directive information Advanced Directives 04/24/2016  Does patient have an advance directive? No  Would patient like information on creating an advanced directive? No - patient declined information       Extended Emergency Contact Information Primary Emergency Contact: Piggot,Bert Address: [address]          [place], [place] [address] [place] Phone: [phone] Mobile Phone: [phone] Relation: Son Secondary Emergency Contact: Adrian Saran, [place] [address] Johnnette Litter of [place] Phone: [phone] Relation: Other   Allergies  Allergen Reactions  . Codeine     unknown  . Sulfa Antibiotics     unknown    Chief Complaint  Patient presents with  . New Admit To SNF    New Admission     HPI:  Patient is a 82 y.o. female seen today for short term rehabilitation post hospital admission from 04/24/16-04/29/16 with syncopal episode. This was thought to be vasovagal with cardiac etiology ruled out. PE was ruled out. She had UTI and was started on antibiotics. She has completed her antibiotics. She had nausea and her KUB was normal. She has PMH of hypertension, pacemaker due to third-degree AV block, chronic rotator cuff shoulder pain. She is seen in her room today.    Review of Systems:  Constitutional: Negative for fever, chills, diaphoresis. Feels weak and tired.  HENT: Negative for headache, congestion, nasal discharge, hearing loss, sore throat, difficulty swallowing.   Eyes: Negative for blurred vision, double vision and discharge. Wears glasses.  Respiratory: Negative for cough, shortness of breath and wheezing.   Cardiovascular: Negative for chest pain, palpitations, leg swelling.  Gastrointestinal: Negative for heartburn, nausea, vomiting, abdominal pain. Last bowel movement was 2 days ago.  Genitourinary: Negative  for dysuria and flank pain.  Musculoskeletal: Negative for back pain, fall in the facility.  Skin: Negative for itching, rash.  Neurological: Negative for dizziness. Psychiatric/Behavioral: Negative for depression   Past Medical History  Diagnosis Date  . Hypertension   . 2Nd degree AV block   . Pacemaker    Past Surgical History  Procedure Laterality Date  . Permanent pacemaker insertion N/A 08/04/2014    Procedure: PERMANENT PACEMAKER INSERTION;  Surgeon: Deboraha Sprang, MD;  Location: [facility] CATH LAB;  Service: Cardiovascular;  Laterality: N/A;   Social History:   reports that she has never smoked. She does not have any smokeless tobacco history on file. She reports that she does not drink alcohol or use illicit drugs.  Family History  Problem Relation Age of Onset  . Hypertension Mother   . Heart attack Father     Medications:   Medication List       This list is accurate as of: 04/30/16 12:21 PM.  Always use your most recent med list.               acetaminophen 325 MG tablet  Commonly known as:  TYLENOL  Take 1-2 tablets (325-650 mg total) by mouth every 4 (four) hours as needed for mild pain.     ALPRAZolam 0.5 MG tablet  Commonly known as:  XANAX  Take 1 tablet (0.5 mg total) by mouth at bedtime as needed for anxiety.     aspirin 81 MG tablet  Take 81 mg by mouth daily.     calcium-vitamin D  500-200 MG-UNIT tablet  Commonly known as:  OSCAL WITH D  Take 1 tablet by mouth daily.     carvedilol 12.5 MG tablet  Commonly known as:  COREG  Take 12.5 mg by mouth 2 (two) times daily with a meal.     cycloSPORINE 0.05 % ophthalmic emulsion  Commonly known as:  RESTASIS  1 drop 2 (two) times daily. Both eyes: 1 morning, 1 nightly, PRN for dry-eye syndrom     docusate 50 MG/5ML liquid  Commonly known as:  COLACE  Take 10 mLs (100 mg total) by mouth 2 (two) times daily as needed for mild constipation.     glycopyrrolate 2 MG tablet  Commonly known as:  ROBINUL    Take 2 mg by mouth 2 (two) times daily.     lidocaine 5 %  Commonly known as:  LIDODERM  Place 1 patch onto the skin daily.     ondansetron 4 MG disintegrating tablet  Commonly known as:  ZOFRAN-ODT  Take 1 tablet (4 mg total) by mouth every 8 (eight) hours as needed for nausea or vomiting.     senna 8.6 MG Tabs tablet  Commonly known as:  SENOKOT  Take 2 tablets (17.2 mg total) by mouth daily.     verapamil 180 MG CR tablet  Commonly known as:  CALAN-SR  Take 180 mg by mouth at bedtime.        Immunizations:  There is no immunization history on file for this patient.   Physical Exam: Filed Vitals:   04/30/16 1214  BP: 133/60  Pulse: 84  Temp: 96.7 F (35.9 C)  TempSrc: Oral  Resp: 16  Height: 5\' 2"  (1.575 m)  Weight: 142 lb (64.411 kg)  SpO2: 96%   Body mass index is 25.97 kg/(m^2).  General- elderly female, well built, in no acute distress Head- normocephalic, atraumatic Nose- no maxillary or frontal sinus tenderness, no nasal discharge Throat- moist mucus membrane Eyes- PERRLA, EOMI, no pallor, no icterus, no discharge, normal conjunctiva, normal sclera Neck- no cervical lymphadenopathy Cardiovascular- normal s1,s2, no murmur, trace leg edema Respiratory- bilateral clear to auscultation, no wheeze, no rhonchi, no crackles, no use of accessory muscles Abdomen- bowel sounds present, soft, non tender Musculoskeletal- able to move all 4 extremities, generalized weakness, arthritis changes to her fingers Neurological- alert and oriented to person, place and time Skin- warm and dry Psychiatry- normal mood and affect    Labs reviewed: Basic Metabolic Panel:  Recent Labs  04/24/16 1408  04/26/16 0250 04/27/16 04/27/16 0311  NA 144  --  141 141 141  K 3.9  --  3.6  --  3.5  CL 108  --  107  --  106  CO2 29  --  26  --  26  GLUCOSE 142*  --  98  --  109*  BUN 23*  --  18 20 20   CREATININE 1.38*  < > 1.04* 0.9 0.94  CALCIUM 9.0  --  9.8  --  9.8  < >  = values in this interval not displayed. Liver Function Tests:  Recent Labs  03/29/16 1903 04/27/16 0311  AST 17 14*  ALT 13* 11*  ALKPHOS 62 51  BILITOT 0.6 0.7  PROT 7.2 5.8*  ALBUMIN 3.7 2.8*    Recent Labs  03/29/16 1903  LIPASE 19   No results for input(s): AMMONIA in the last 8760 hours. CBC:  Recent Labs  04/24/16 1408 04/24/16 1838 04/27/16 04/27/16 0311  WBC  6.8 6.9 6.1 6.1  NEUTROABS 4.1  --   --   --   HGB 12.5 12.1  --  11.9*  HCT 39.9 38.7  --  37.7  MCV 90.9 91.9  --  89.3  PLT 249 229  --  242   Cardiac Enzymes:  Recent Labs  04/24/16 1838 04/24/16 2342 04/25/16 0618  TROPONINI <0.03 <0.03 <0.03   BNP: Invalid input(s): POCBNP CBG:  Recent Labs  04/27/16 0654 04/28/16 0642 04/29/16 0613  GLUCAP 110* 108* 106*    Radiological Exams: Dg Chest 2 View  04/24/2016  CLINICAL DATA:  syncopePassed out today at [place] office having shoulder injectionHypertensionPacemaker Z95.0 EXAM: CHEST  2 VIEW COMPARISON:  03/29/2016 FINDINGS: He cardiac silhouette is normal in size and configuration. The left anterior chest wall sequential pacemaker leads are well positioned in the right atrium right ventricle, and stable. No mediastinal or hilar masses or enlarged lymph nodes. There are calcified nodes along the right peritracheal mediastinum and right hilum. Elevation the right hemidiaphragm is stable. Lungs are clear.  No pleural effusion or pneumothorax. Bony thorax is demineralized but intact. IMPRESSION: No acute cardiopulmonary disease. Electronically Signed   By: Lajean Manes M.D.   On: 04/24/2016 14:33   Dg Abd 1 View  04/28/2016  CLINICAL DATA:  Vomiting today, weakness EXAM: ABDOMEN - 1 VIEW COMPARISON:  01/31/2016 FINDINGS: There is normal small bowel gas pattern. Moderate stool and gas noted in distal right colon hepatic flexure of the colon and transverse colon. IMPRESSION: Normal small bowel gas pattern. Moderate stool and gas in proximal colon.  Electronically Signed   By: Lahoma Crocker M.D.   On: 04/28/2016 13:18   Ct Angio Chest Pe W/cm &/or Wo Cm  04/25/2016  CLINICAL DATA:  Syncopal episode.  Elevated D-dimer. EXAM: CT ANGIOGRAPHY CHEST WITH CONTRAST TECHNIQUE: Multidetector CT imaging of the chest was performed using the standard protocol during bolus administration of intravenous contrast. Multiplanar CT image reconstructions and MIPs were obtained to evaluate the vascular anatomy. CONTRAST:  80 mL of Isovue 370 COMPARISON:  Chest x-ray from yesterday FINDINGS: The central airways are normal. No pneumothorax. There is elevation of the right hemidiaphragm with adjacent atelectasis. No suspicious infiltrates, nodules, or masses. The patient has a pacemaker with leads in the right atrium and right ventricle. There is atherosclerotic change in the thoracic aorta with no aneurysm or dissection identified. There are coronary artery calcifications. The heart size is borderline but otherwise unremarkable. Prominence to the right superior mediastinum on series 4, image 14 appears to represent prominent vasculature, incompletely evaluated due to timing of contrast. I believe this is probably the right brachiocephalic artery and vein. There are calcified nodes in the bilateral hila and subcarinal region. No uncalcified adenopathy is identified. No pulmonary emboli. No effusions. Evaluation of the upper abdomen is limited with no significant abnormalities. There is a right renal cyst. No acute bony abnormalities. Review of the MIP images confirms the above findings. IMPRESSION: 1. No pulmonary emboli or acute abnormality. Electronically Signed   By: Dorise Bullion III M.D   On: 04/25/2016 13:13    Assessment/Plan  Generalized weakness Will have her work with physical therapy and occupational therapy team to help with gait training and muscle strengthening exercises.fall precautions. Skin care. Encourage to be out of bed.   Constipation Continue senna  with colace and monitor. Hydration to be maintained  Anxiety Stable, continue xanax 0.5 mg qhs prn and monitor  OA Continue calcium and vitamin  d supplement with tylenol for pain. Continue lidocaine patch  HTN Monitor BP and HR bid x 1 week, continue coreg 12.5 mg bid with aspirin  AV block S/p pacemaker. Continue aspirin for now  ckd stage 4 Monitor bmp   Goals of care: short term rehabilitation   Labs/tests ordered: cbc, cmp 05/05/16  Family/ staff Communication: reviewed care plan with patient and nursing supervisor    Blanchie Serve, MD Internal Medicine [place], [place] [address] Cell Phone (Monday-Friday 8 am - 5 pm): [phone] On Call: [phone] and follow prompts after 5 pm and on weekends Office Phone: [phone] Office Fax: [phone]

## 2016-05-01 ENCOUNTER — Ambulatory Visit (INDEPENDENT_AMBULATORY_CARE_PROVIDER_SITE_OTHER): Payer: Medicare Other | Admitting: Nurse Practitioner

## 2016-05-01 ENCOUNTER — Encounter: Payer: Self-pay | Admitting: Nurse Practitioner

## 2016-05-01 ENCOUNTER — Other Ambulatory Visit: Payer: Self-pay

## 2016-05-01 VITALS — BP 126/72 | HR 60 | Ht 62.0 in | Wt 142.2 lb

## 2016-05-01 DIAGNOSIS — I10 Essential (primary) hypertension: Secondary | ICD-10-CM | POA: Diagnosis not present

## 2016-05-01 DIAGNOSIS — I441 Atrioventricular block, second degree: Secondary | ICD-10-CM

## 2016-05-01 LAB — CUP PACEART INCLINIC DEVICE CHECK
Battery Remaining Longevity: 106 mo
Battery Voltage: 2.79 V
Brady Statistic AS VP Percent: 36 %
Date Time Interrogation Session: 20170608170814
Implantable Lead Location: 753859
Implantable Lead Location: 753860
Lead Channel Impedance Value: 515 Ohm
Lead Channel Impedance Value: 622 Ohm
Lead Channel Pacing Threshold Amplitude: 0.5 V
Lead Channel Pacing Threshold Amplitude: 0.625 V
Lead Channel Pacing Threshold Pulse Width: 0.4 ms
Lead Channel Pacing Threshold Pulse Width: 0.4 ms
Lead Channel Setting Pacing Pulse Width: 0.4 ms
MDC IDC LEAD IMPLANT DT: 20150911
MDC IDC LEAD IMPLANT DT: 20150911
MDC IDC MSMT BATTERY IMPEDANCE: 173 Ohm
MDC IDC MSMT LEADCHNL RA PACING THRESHOLD AMPLITUDE: 0.375 V
MDC IDC MSMT LEADCHNL RA PACING THRESHOLD PULSEWIDTH: 0.4 ms
MDC IDC MSMT LEADCHNL RV PACING THRESHOLD AMPLITUDE: 0.75 V
MDC IDC MSMT LEADCHNL RV PACING THRESHOLD PULSEWIDTH: 0.4 ms
MDC IDC SET LEADCHNL RA PACING AMPLITUDE: 2 V
MDC IDC SET LEADCHNL RV PACING AMPLITUDE: 2.5 V
MDC IDC SET LEADCHNL RV SENSING SENSITIVITY: 4 mV
MDC IDC STAT BRADY AP VP PERCENT: 64 %
MDC IDC STAT BRADY AP VS PERCENT: 0 %
MDC IDC STAT BRADY AS VS PERCENT: 0 %

## 2016-05-01 NOTE — Patient Instructions (Signed)
Medication Instructions:   Your physician recommends that you continue on your current medications as directed. Please refer to the Current Medication list given to you today.    If you need a refill on your cardiac medications before your next appointment, please call your pharmacy.  Labwork:  NONE ORDER TODAY    Testing/Procedures:  NONE ORDER TODAY    Follow-Up:  Remote monitoring is used to monitor your Pacemaker of ICD from home. This monitoring reduces the number of office visits required to check your device to one time per year. It allows [place] to keep an eye on the functioning of your device to ensure it is working properly. You are scheduled for a device check from home on .07/31/2016..You may send your transmission at any time that day. If you have a wireless device, the transmission will be sent automatically. After your physician reviews your transmission, you will receive a postcard with your next transmission date.  Your physician wants you to follow-up in: [place] will receive a reminder letter in the mail two months in advance. If you don't receive a letter, please call our office to schedule the follow-up appointment.     Any Other Special Instructions Will Be Listed Below (If Applicable).

## 2016-05-01 NOTE — Progress Notes (Signed)
Electrophysiology Office Note Date: 05/01/2016  ID:  Victoria Lloyd, DOB 01-Feb-1922, MRN [id number]  PCP: Victoria Spurling, MD Electrophysiologist: Victoria Lloyd  CC: Pacemaker follow-up  Victoria Lloyd is a 82 y.o. female seen today for Dr Victoria Lloyd.  She presents today for routine electrophysiology followup.  Since last being seen in our clinic, the patient reports doing reasonably well.  She recently had a syncopal spell and was evaluated at [facility].  No arrhythmias were seen on pacemaker interrogation at that time.  She denies chest pain, palpitations, dyspnea, PND, orthopnea, nausea, vomiting, dizziness, syncope, edema, weight gain, or early satiety. She is currently at rehab and is thinking about long term SNF placement.  Device History: MDT dual chamber PPM implanted 2016 for 2:1 heart block    Past Medical History  Diagnosis Date  . Hypertension   . Complete heart block (HCC)     a. s/p MDT dual chamber PPM    Past Surgical History  Procedure Laterality Date  . Permanent pacemaker insertion N/A 08/04/2014    Procedure: PERMANENT PACEMAKER INSERTION;  Surgeon: Victoria Sprang, MD;  Location: [facility] CATH LAB;  Service: Cardiovascular;  Laterality: N/A;    Current Outpatient Prescriptions  Medication Sig Dispense Refill  . acetaminophen (TYLENOL) 325 MG tablet Take 1-2 tablets (325-650 mg total) by mouth every 4 (four) hours as needed for mild pain.    Marland Kitchen ALPRAZolam (XANAX) 0.5 MG tablet Take 1 tablet (0.5 mg total) by mouth at bedtime as needed for anxiety. 20 tablet 0  . aspirin 81 MG tablet Take 81 mg by mouth daily.      . calcium-vitamin D (OSCAL WITH D) 500-200 MG-UNIT per tablet Take 1 tablet by mouth daily.      . carvedilol (COREG) 12.5 MG tablet Take 12.5 mg by mouth 2 (two) times daily with a meal.    . cycloSPORINE (RESTASIS) 0.05 % ophthalmic emulsion 1 drop 2 (two) times daily. Both eyes: 1 morning, 1 nightly, PRN for dry-eye syndrom    . docusate (COLACE) 50 MG/5ML liquid  Take 10 mLs (100 mg total) by mouth 2 (two) times daily as needed for mild constipation. 100 mL 0  . glycopyrrolate (ROBINUL) 2 MG tablet Take 2 mg by mouth 2 (two) times daily.    Marland Kitchen lidocaine (LIDODERM) 5 % Place 1 patch onto the skin daily.     . ondansetron (ZOFRAN-ODT) 4 MG disintegrating tablet Take 1 tablet (4 mg total) by mouth every 8 (eight) hours as needed for nausea or vomiting. 20 tablet 0  . senna (SENOKOT) 8.6 MG TABS tablet Take 2 tablets (17.2 mg total) by mouth daily. 120 each 0  . verapamil (CALAN-SR) 180 MG CR tablet Take 180 mg by mouth at bedtime.     No current facility-administered medications for this visit.    Allergies:   Codeine and Sulfa antibiotics   Social History: Social History   Social History  . Marital Status: Widowed    Spouse Name: N/A  . Number of Children: N/A  . Years of Education: N/A   Occupational History  . Not on file.   Social History Main Topics  . Smoking status: Never Smoker   . Smokeless tobacco: Not on file  . Alcohol Use: No  . Drug Use: No  . Sexual Activity: Not on file   Other Topics Concern  . Not on file   Social History Narrative    Family History: Family History  Problem Relation Age of  Onset  . Hypertension Mother   . Heart attack Father      Review of Systems: All other systems reviewed and are otherwise negative except as noted above.   Physical Exam: VS:  BP 126/72 mmHg  Pulse 60  Ht 5\' 2"  (1.575 m)  Wt 142 lb 3.2 oz (64.501 kg)  BMI 26.00 kg/m2 , BMI Body mass index is 26 kg/(m^2).  GEN- The patient is elderly appearing, alert and oriented x 3 today.   HEENT: normocephalic, atraumatic; sclera clear, conjunctiva pink; hearing intact; oropharynx clear; neck supple  Lungs- Clear to ausculation bilaterally, normal work of breathing.  No wheezes, rales, rhonchi Heart- Regular rate and rhythm (paced) GI- soft, non-tender, non-distended, bowel sounds present  Extremities- no clubbing, cyanosis, or  edema; DP/PT/radial pulses 2+ bilaterally MS- no significant deformity or atrophy Skin- warm and dry, no rash or lesion; PPM pocket well healed Psych- euthymic mood, full affect Neuro- strength and sensation are intact  PPM Interrogation- reviewed in detail today,  See PACEART report  EKG:  EKG is not ordered today.  Recent Labs: 04/27/2016: ALT 11*; BUN 20; Creatinine, Ser 0.94; Hemoglobin 11.9*; Platelets 242; Potassium 3.5; Sodium 141   Wt Readings from Last 3 Encounters:  05/01/16 142 lb 3.2 oz (64.501 kg)  04/30/16 142 lb (64.411 kg)  04/29/16 139 lb 15.9 oz (63.5 kg)     Other studies Reviewed: Additional studies/ records that were reviewed today include: Dr Victoria Lloyd office notes  Assessment and Plan:  1.  2:1 heart block Normal PPM function See Victoria Lloyd Art report No changes today  2.  HTN Stable No change required today  3.  Syncope Unclear etiology Offered to decrease BB/CCB today, but her son would prefer to leave medications as they are Discussed compression hose and abdominal binder to help with orthostatic intolerance    Current medicines are reviewed at length with the patient today.   The patient does not have concerns regarding her medicines.  The following changes were made today:  none  Labs/ tests ordered today include: none  No orders of the defined types were placed in this encounter.     Disposition:   Follow up with [facility] transmissions, Dr Victoria Lloyd 1 year   Signed, Victoria Marshall, NP 05/01/2016 2:45 PM  [place] [phone] (office) [phone] (fax)

## 2016-05-16 ENCOUNTER — Non-Acute Institutional Stay (SKILLED_NURSING_FACILITY): Payer: Medicare Other | Admitting: Adult Health

## 2016-05-16 ENCOUNTER — Encounter: Payer: Self-pay | Admitting: Adult Health

## 2016-05-16 DIAGNOSIS — F419 Anxiety disorder, unspecified: Secondary | ICD-10-CM | POA: Diagnosis not present

## 2016-05-16 DIAGNOSIS — I442 Atrioventricular block, complete: Secondary | ICD-10-CM

## 2016-05-16 DIAGNOSIS — K5900 Constipation, unspecified: Secondary | ICD-10-CM

## 2016-05-16 DIAGNOSIS — N184 Chronic kidney disease, stage 4 (severe): Secondary | ICD-10-CM

## 2016-05-16 DIAGNOSIS — I10 Essential (primary) hypertension: Secondary | ICD-10-CM | POA: Diagnosis not present

## 2016-05-16 DIAGNOSIS — R531 Weakness: Secondary | ICD-10-CM | POA: Diagnosis not present

## 2016-05-16 DIAGNOSIS — M159 Polyosteoarthritis, unspecified: Secondary | ICD-10-CM

## 2016-05-16 NOTE — Progress Notes (Signed)
Patient ID: ELAHNI ABDELKADER, female   DOB: Apr 12, 1922, 82 y.o.   MRN: [id number]    DATE:  05/16/2016   MRN:  [id number]  BIRTHDAY: 1922/09/23  Facility:  Nursing Home Location:  [facility] Room Number: 1108-P  LEVEL OF CARE:  SNF [phone])  Contact Information    Name Relation Home Work Sheldon Son [id number]  [phone]   Leonie Green [phone]         Code Status History    Date Active Date Inactive Code Status Order ID Comments User Context   04/24/2016  6:16 PM 04/29/2016  4:00 PM Full Code [id number]  Mendel Corning, MD Inpatient   08/04/2014  5:14 PM 08/06/2014  2:59 PM Full Code [id number]  Deboraha Sprang, MD Inpatient   08/03/2014  6:33 PM 08/04/2014  5:14 PM Full Code [id number]  Eileen Stanford, PA-C Inpatient       Chief Complaint  Patient presents with  . Discharge Note    HISTORY OF PRESENT ILLNESS:  This is a 82 year old female who is for discharge home with home health OT for ADLs, PT for endurance, CNA for showers and Education officer, museum for community referral support. DME:  Standard wheelchair, cushion, elevated leg rest and removable armrest.   She has been admitted to [facility] on 04/29/16 from [facility] with syncopal episode. This was thought to be vasovagal with cardiac etiology ruled out. PE was ruled out. She had UTI and was started on antibiotics. She has completed her antibiotics. She had nausea and her KUB was normal.  Patient was admitted to this facility for short-term rehabilitation after the patient's recent hospitalization.  Patient has completed SNF rehabilitation and therapy has cleared the patient for discharge.  PAST MEDICAL HISTORY:  Past Medical History  Diagnosis Date  . Hypertension   . Complete heart block (HCC)     a. s/p MDT dual chamber PPM      CURRENT MEDICATIONS: Reviewed  Patient's Medications  New Prescriptions   No medications on file  Previous Medications    ACETAMINOPHEN (TYLENOL) 325 MG TABLET    Take 1-2 tablets (325-650 mg total) by mouth every 4 (four) hours as needed for mild pain.   ALPRAZOLAM (XANAX) 0.5 MG TABLET    Take 1 tablet (0.5 mg total) by mouth at bedtime as needed for anxiety.   ASPIRIN 81 MG TABLET    Take 81 mg by mouth daily.     CALCIUM-VITAMIN D (OSCAL WITH D) 500-200 MG-UNIT PER TABLET    Take 1 tablet by mouth daily.     CARVEDILOL (COREG) 12.5 MG TABLET    Take 12.5 mg by mouth 2 (two) times daily with a meal.   CYCLOSPORINE (RESTASIS) 0.05 % OPHTHALMIC EMULSION    1 drop 2 (two) times daily. Both eyes: 1 morning, 1 nightly, PRN for dry-eye syndrome   DOCUSATE (COLACE) 50 MG/5ML LIQUID    Take 10 mLs (100 mg total) by mouth 2 (two) times daily as needed for mild constipation.   GLYCOPYRROLATE (ROBINUL) 2 MG TABLET    Take 2 mg by mouth 2 (two) times daily.   LIDOCAINE (LIDODERM) 5 %    Place 1 patch onto the skin daily.    ONDANSETRON (ZOFRAN-ODT) 4 MG DISINTEGRATING TABLET    Take 1 tablet (4 mg total) by mouth every 8 (eight) hours as needed for nausea or vomiting.   SENNA (SENOKOT) 8.6 MG TABS  TABLET    Take 2 tablets (17.2 mg total) by mouth daily.   VERAPAMIL (CALAN-SR) 180 MG CR TABLET    Take 180 mg by mouth at bedtime.  Modified Medications   No medications on file  Discontinued Medications   No medications on file     Allergies  Allergen Reactions  . Codeine     unknown  . Sulfa Antibiotics     unknown     REVIEW OF SYSTEMS:  GENERAL: no change in appetite, no fatigue, no weight changes, no fever, chills or weakness EYES: Denies change in vision, dry eyes, eye pain, itching or discharge EARS: Denies change in hearing, ringing in ears, or earache NOSE: Denies nasal congestion or epistaxis MOUTH and THROAT: Denies oral discomfort, gingival pain or bleeding, pain from teeth or hoarseness   RESPIRATORY: no cough, SOB, DOE, wheezing, hemoptysis CARDIAC: no chest pain, edema or palpitations GI: no  abdominal pain, diarrhea, constipation, heart burn, nausea or vomiting GU: Denies dysuria, frequency, hematuria, incontinence, or discharge PSYCHIATRIC: Denies feeling of depression or anxiety. No report of hallucinations, insomnia, paranoia, or agitation   PHYSICAL EXAMINATION  GENERAL APPEARANCE: Well nourished. In no acute distress. Normal body habitus SKIN:  Skin is warm and dry.  HEAD: Normal in size and contour. No evidence of trauma EYES: Lids open and close normally. No blepharitis, entropion or ectropion. PERRL. Conjunctivae are clear and sclerae are white. Lenses are without opacity EARS: Pinnae are normal. Patient hears normal voice tunes of the examiner MOUTH and THROAT: Lips are without lesions. Oral mucosa is moist and without lesions. Tongue is normal in shape, size, and color and without lesions NECK: supple, trachea midline, no neck masses, no thyroid tenderness, no thyromegaly LYMPHATICS: no LAN in the neck, no supraclavicular LAN RESPIRATORY: breathing is even & unlabored, BS CTAB CARDIAC: RRR, no murmur,no extra heart sounds, no edema GI: abdomen soft, normal BS, no masses, no tenderness, no hepatomegaly, no splenomegaly EXTREMITIES:  Able to move 4 extremities PSYCHIATRIC: Alert and oriented X 3. Affect and behavior are appropriate  LABS/RADIOLOGY: Labs reviewed: Basic Metabolic Panel:  Recent Labs  04/24/16 1408  04/26/16 0250 04/27/16 04/27/16 0311  NA 144  --  141 141 141  K 3.9  --  3.6  --  3.5  CL 108  --  107  --  106  CO2 29  --  26  --  26  GLUCOSE 142*  --  98  --  109*  BUN 23*  --  18 20 20   CREATININE 1.38*  < > 1.04* 0.9 0.94  CALCIUM 9.0  --  9.8  --  9.8  < > = values in this interval not displayed. Liver Function Tests:  Recent Labs  03/29/16 1903 04/27/16 0311  AST 17 14*  ALT 13* 11*  ALKPHOS 62 51  BILITOT 0.6 0.7  PROT 7.2 5.8*  ALBUMIN 3.7 2.8*    Recent Labs  03/29/16 1903  LIPASE 19    CBC:  Recent Labs   04/24/16 1408 04/24/16 1838 04/27/16 04/27/16 0311  WBC 6.8 6.9 6.1 6.1  NEUTROABS 4.1  --   --   --   HGB 12.5 12.1  --  11.9*  HCT 39.9 38.7  --  37.7  MCV 90.9 91.9  --  89.3  PLT 249 229  --  242   Cardiac Enzymes:  Recent Labs  04/24/16 1838 04/24/16 2342 04/25/16 0618  TROPONINI <0.03 <0.03 <0.03   CBG:  Recent Labs  04/27/16 0654 04/28/16 0642 04/29/16 0613  GLUCAP 110* 108* 106*      Dg Chest 2 View  04/24/2016  CLINICAL DATA:  syncopePassed out today at [place] office having shoulder injectionHypertensionPacemaker Z95.0 EXAM: CHEST  2 VIEW COMPARISON:  03/29/2016 FINDINGS: He cardiac silhouette is normal in size and configuration. The left anterior chest wall sequential pacemaker leads are well positioned in the right atrium right ventricle, and stable. No mediastinal or hilar masses or enlarged lymph nodes. There are calcified nodes along the right peritracheal mediastinum and right hilum. Elevation the right hemidiaphragm is stable. Lungs are clear.  No pleural effusion or pneumothorax. Bony thorax is demineralized but intact. IMPRESSION: No acute cardiopulmonary disease. Electronically Signed   By: Lajean Manes M.D.   On: 04/24/2016 14:33   Dg Abd 1 View  04/28/2016  CLINICAL DATA:  Vomiting today, weakness EXAM: ABDOMEN - 1 VIEW COMPARISON:  01/31/2016 FINDINGS: There is normal small bowel gas pattern. Moderate stool and gas noted in distal right colon hepatic flexure of the colon and transverse colon. IMPRESSION: Normal small bowel gas pattern. Moderate stool and gas in proximal colon. Electronically Signed   By: Lahoma Crocker M.D.   On: 04/28/2016 13:18   Ct Angio Chest Pe W/cm &/or Wo Cm  04/25/2016  CLINICAL DATA:  Syncopal episode.  Elevated D-dimer. EXAM: CT ANGIOGRAPHY CHEST WITH CONTRAST TECHNIQUE: Multidetector CT imaging of the chest was performed using the standard protocol during bolus administration of intravenous contrast. Multiplanar CT image  reconstructions and MIPs were obtained to evaluate the vascular anatomy. CONTRAST:  80 mL of Isovue 370 COMPARISON:  Chest x-ray from yesterday FINDINGS: The central airways are normal. No pneumothorax. There is elevation of the right hemidiaphragm with adjacent atelectasis. No suspicious infiltrates, nodules, or masses. The patient has a pacemaker with leads in the right atrium and right ventricle. There is atherosclerotic change in the thoracic aorta with no aneurysm or dissection identified. There are coronary artery calcifications. The heart size is borderline but otherwise unremarkable. Prominence to the right superior mediastinum on series 4, image 14 appears to represent prominent vasculature, incompletely evaluated due to timing of contrast. I believe this is probably the right brachiocephalic artery and vein. There are calcified nodes in the bilateral hila and subcarinal region. No uncalcified adenopathy is identified. No pulmonary emboli. No effusions. Evaluation of the upper abdomen is limited with no significant abnormalities. There is a right renal cyst. No acute bony abnormalities. Review of the MIP images confirms the above findings. IMPRESSION: 1. No pulmonary emboli or acute abnormality. Electronically Signed   By: Dorise Bullion III M.D   On: 04/25/2016 13:13    ASSESSMENT/PLAN:  Generalized weakness - for home health OT, PT, CNA and Social Worker  Constipation - continue senna 8.6 mg take 2 tabs by mouth daily at bedtime and toco states sodium 50 mg/5 ML take 10 ML by mouth twice a day when necessary  Anxiety - mood is stable; continue alprazolam 0.5 mg 1 tab by mouth daily at bedtime when necessary   Osteoarthritis - continue calcium, vitamin D, Tylenol and lidocaine patch  Hypertension - continue carvedilol 12.5 mg 1 tab by mouth twice a day and verapamil ER 180 mg 1 tab by mouth daily at bedtime  3rd degree AV block - S/P maker; continue aspirin EC 81 mg 1 tab by mouth  daily  Chronic kidney disease, stage IV - creatinine 0.94; stable     I have filled  out patient's discharge paperwork and written prescriptions.  Patient will receive home health PT, OT, Social Worker and CNA.  DME provided:  Standard wheelchair, cushion, elevated leg rest and removable armrest  Total discharge time: Greater than 30 minutes  Discharge time involved coordination of the discharge process with social worker, nursing staff and therapy department. Medical justification for home health services/DME verified.      Durenda Age, NP [employer] [phone]

## 2016-06-03 ENCOUNTER — Emergency Department (HOSPITAL_COMMUNITY): Payer: Medicare Other

## 2016-06-03 ENCOUNTER — Observation Stay (HOSPITAL_COMMUNITY)
Admission: EM | Admit: 2016-06-03 | Discharge: 2016-06-06 | Disposition: A | Discharge status: Home / Self Care | Payer: Medicare Other | Attending: Internal Medicine | Admitting: Internal Medicine

## 2016-06-03 ENCOUNTER — Encounter (HOSPITAL_COMMUNITY): Payer: Self-pay

## 2016-06-03 DIAGNOSIS — I442 Atrioventricular block, complete: Secondary | ICD-10-CM | POA: Diagnosis not present

## 2016-06-03 DIAGNOSIS — N184 Chronic kidney disease, stage 4 (severe): Secondary | ICD-10-CM | POA: Diagnosis not present

## 2016-06-03 DIAGNOSIS — I130 Hypertensive heart and chronic kidney disease with heart failure and stage 1 through stage 4 chronic kidney disease, or unspecified chronic kidney disease: Secondary | ICD-10-CM | POA: Diagnosis not present

## 2016-06-03 DIAGNOSIS — E739 Lactose intolerance, unspecified: Secondary | ICD-10-CM | POA: Insufficient documentation

## 2016-06-03 DIAGNOSIS — Z882 Allergy status to sulfonamides status: Secondary | ICD-10-CM | POA: Diagnosis not present

## 2016-06-03 DIAGNOSIS — Z950 Presence of cardiac pacemaker: Secondary | ICD-10-CM | POA: Insufficient documentation

## 2016-06-03 DIAGNOSIS — R778 Other specified abnormalities of plasma proteins: Secondary | ICD-10-CM | POA: Diagnosis present

## 2016-06-03 DIAGNOSIS — N390 Urinary tract infection, site not specified: Secondary | ICD-10-CM | POA: Insufficient documentation

## 2016-06-03 DIAGNOSIS — R296 Repeated falls: Secondary | ICD-10-CM | POA: Insufficient documentation

## 2016-06-03 DIAGNOSIS — I5033 Acute on chronic diastolic (congestive) heart failure: Secondary | ICD-10-CM | POA: Diagnosis not present

## 2016-06-03 DIAGNOSIS — I447 Left bundle-branch block, unspecified: Secondary | ICD-10-CM | POA: Diagnosis not present

## 2016-06-03 DIAGNOSIS — R0602 Shortness of breath: Secondary | ICD-10-CM | POA: Diagnosis present

## 2016-06-03 DIAGNOSIS — Z885 Allergy status to narcotic agent status: Secondary | ICD-10-CM | POA: Insufficient documentation

## 2016-06-03 DIAGNOSIS — I10 Essential (primary) hypertension: Secondary | ICD-10-CM | POA: Diagnosis present

## 2016-06-03 DIAGNOSIS — R7989 Other specified abnormal findings of blood chemistry: Secondary | ICD-10-CM

## 2016-06-03 DIAGNOSIS — E876 Hypokalemia: Secondary | ICD-10-CM | POA: Insufficient documentation

## 2016-06-03 DIAGNOSIS — I5032 Chronic diastolic (congestive) heart failure: Secondary | ICD-10-CM

## 2016-06-03 DIAGNOSIS — Z7982 Long term (current) use of aspirin: Secondary | ICD-10-CM | POA: Insufficient documentation

## 2016-06-03 DIAGNOSIS — F419 Anxiety disorder, unspecified: Secondary | ICD-10-CM | POA: Diagnosis not present

## 2016-06-03 DIAGNOSIS — R609 Edema, unspecified: Secondary | ICD-10-CM | POA: Diagnosis present

## 2016-06-03 DIAGNOSIS — I509 Heart failure, unspecified: Secondary | ICD-10-CM

## 2016-06-03 HISTORY — DX: Chronic kidney disease, stage 4 (severe): N18.4

## 2016-06-03 HISTORY — DX: Chronic diastolic (congestive) heart failure: I50.32

## 2016-06-03 LAB — CBC WITH DIFFERENTIAL/PLATELET
Basophils Absolute: 0 10*3/uL (ref 0.0–0.1)
Basophils Relative: 0 %
EOS ABS: 0.1 10*3/uL (ref 0.0–0.7)
EOS PCT: 1 %
HCT: 44.2 % (ref 36.0–46.0)
Hemoglobin: 14 g/dL (ref 12.0–15.0)
LYMPHS ABS: 1.5 10*3/uL (ref 0.7–4.0)
Lymphocytes Relative: 16 %
MCH: 29 pg (ref 26.0–34.0)
MCHC: 31.7 g/dL (ref 30.0–36.0)
MCV: 91.7 fL (ref 78.0–100.0)
MONOS PCT: 7 %
Monocytes Absolute: 0.7 10*3/uL (ref 0.1–1.0)
Neutro Abs: 6.9 10*3/uL (ref 1.7–7.7)
Neutrophils Relative %: 76 %
PLATELETS: 278 10*3/uL (ref 150–400)
RBC: 4.82 MIL/uL (ref 3.87–5.11)
RDW: 13.4 % (ref 11.5–15.5)
WBC: 9.2 10*3/uL (ref 4.0–10.5)

## 2016-06-03 LAB — URINE MICROSCOPIC-ADD ON

## 2016-06-03 LAB — I-STAT TROPONIN, ED
TROPONIN I, POC: 0.15 ng/mL — AB (ref 0.00–0.08)
TROPONIN I, POC: 0.15 ng/mL — AB (ref 0.00–0.08)

## 2016-06-03 LAB — BRAIN NATRIURETIC PEPTIDE: B Natriuretic Peptide: 500.1 pg/mL — ABNORMAL HIGH (ref 0.0–100.0)

## 2016-06-03 LAB — TROPONIN I: TROPONIN I: 0.19 ng/mL — AB (ref <0.03)

## 2016-06-03 LAB — COMPREHENSIVE METABOLIC PANEL
ALBUMIN: 3.6 g/dL (ref 3.5–5.0)
ALK PHOS: 55 U/L (ref 38–126)
ALT: 10 U/L — ABNORMAL LOW (ref 14–54)
AST: 17 U/L (ref 15–41)
Anion gap: 10 (ref 5–15)
BILIRUBIN TOTAL: 0.7 mg/dL (ref 0.3–1.2)
BUN: 15 mg/dL (ref 6–20)
CALCIUM: 10.2 mg/dL (ref 8.9–10.3)
CO2: 29 mmol/L (ref 22–32)
CREATININE: 1.08 mg/dL — AB (ref 0.44–1.00)
Chloride: 103 mmol/L (ref 101–111)
GFR calc Af Amer: 50 mL/min — ABNORMAL LOW (ref >60)
GFR, EST NON AFRICAN AMERICAN: 43 mL/min — AB (ref >60)
GLUCOSE: 125 mg/dL — AB (ref 65–99)
Potassium: 3.9 mmol/L (ref 3.5–5.1)
Sodium: 142 mmol/L (ref 135–145)
TOTAL PROTEIN: 7.2 g/dL (ref 6.5–8.1)

## 2016-06-03 LAB — URINALYSIS, ROUTINE W REFLEX MICROSCOPIC
Bilirubin Urine: NEGATIVE
GLUCOSE, UA: NEGATIVE mg/dL
Hgb urine dipstick: NEGATIVE
Ketones, ur: 15 mg/dL — AB
NITRITE: NEGATIVE
PH: 7 (ref 5.0–8.0)
Protein, ur: NEGATIVE mg/dL
SPECIFIC GRAVITY, URINE: 1.023 (ref 1.005–1.030)

## 2016-06-03 LAB — PROTIME-INR
INR: 1.4 (ref 0.00–1.49)
PROTHROMBIN TIME: 17.3 s — AB (ref 11.6–15.2)

## 2016-06-03 LAB — APTT: aPTT: 36 seconds (ref 24–37)

## 2016-06-03 MED ORDER — ACETAMINOPHEN 325 MG PO TABS: 325.0000 mg | ORAL_TABLET | ORAL | Status: DC | PRN

## 2016-06-03 MED ORDER — ASPIRIN 81 MG PO CHEW
81.0000 mg | CHEWABLE_TABLET | Freq: Every day | ORAL | Status: DC
  Administered 2016-06-04 – 2016-06-06 (×3): 81 mg via ORAL
  Filled 2016-06-03 (×3): qty 1

## 2016-06-03 MED ORDER — DM-GUAIFENESIN ER 30-600 MG PO TB12
1.0000 | ORAL_TABLET | Freq: Two times a day (BID) | ORAL | Status: DC
  Administered 2016-06-03 – 2016-06-06 (×6): 1 via ORAL
  Filled 2016-06-03 (×6): qty 1

## 2016-06-03 MED ORDER — CYCLOSPORINE 0.05 % OP EMUL
1.0000 [drp] | Freq: Two times a day (BID) | OPHTHALMIC | Status: DC
  Administered 2016-06-03 – 2016-06-06 (×6): 1 [drp] via OPHTHALMIC
  Filled 2016-06-03 (×7): qty 1

## 2016-06-03 MED ORDER — FUROSEMIDE 10 MG/ML IJ SOLN
20.0000 mg | Freq: Every day | INTRAMUSCULAR | Status: DC
  Administered 2016-06-03 – 2016-06-06 (×4): 20 mg via INTRAVENOUS
  Filled 2016-06-03 (×4): qty 2

## 2016-06-03 MED ORDER — ALPRAZOLAM 0.25 MG PO TABS
0.5000 mg | ORAL_TABLET | Freq: Every evening | ORAL | Status: DC | PRN
  Administered 2016-06-04 – 2016-06-05 (×2): 0.5 mg via ORAL
  Filled 2016-06-03 (×2): qty 2

## 2016-06-03 MED ORDER — ALBUTEROL SULFATE (2.5 MG/3ML) 0.083% IN NEBU: 2.5000 mg | INHALATION_SOLUTION | RESPIRATORY_TRACT | Status: DC | PRN

## 2016-06-03 MED ORDER — CALCIUM CARBONATE-VITAMIN D 500-200 MG-UNIT PO TABS
1.0000 | ORAL_TABLET | Freq: Every day | ORAL | Status: DC
  Administered 2016-06-04 – 2016-06-06 (×3): 1 via ORAL
  Filled 2016-06-03 (×3): qty 1

## 2016-06-03 MED ORDER — CARVEDILOL 12.5 MG PO TABS
12.5000 mg | ORAL_TABLET | Freq: Two times a day (BID) | ORAL | Status: DC
  Administered 2016-06-04 – 2016-06-06 (×5): 12.5 mg via ORAL
  Filled 2016-06-03 (×5): qty 1

## 2016-06-03 MED ORDER — DEXTROSE 5 % IV SOLN
1.0000 g | Freq: Once | INTRAVENOUS | Status: AC
  Administered 2016-06-03: 1 g via INTRAVENOUS
  Filled 2016-06-03: qty 10

## 2016-06-03 MED ORDER — DOCUSATE SODIUM 50 MG/5ML PO LIQD: 100.0000 mg | Freq: Two times a day (BID) | ORAL | Status: DC | PRN

## 2016-06-03 MED ORDER — ONDANSETRON HCL 4 MG/2ML IJ SOLN: 4.0000 mg | Freq: Four times a day (QID) | INTRAMUSCULAR | Status: DC | PRN

## 2016-06-03 MED ORDER — SENNA 8.6 MG PO TABS
2.0000 | ORAL_TABLET | Freq: Every day | ORAL | Status: DC
  Administered 2016-06-04: 17.2 mg via ORAL
  Filled 2016-06-03: qty 2

## 2016-06-03 MED ORDER — HEPARIN SODIUM (PORCINE) 5000 UNIT/ML IJ SOLN
5000.0000 [IU] | Freq: Three times a day (TID) | INTRAMUSCULAR | Status: DC
  Administered 2016-06-03 – 2016-06-06 (×8): 5000 [IU] via SUBCUTANEOUS
  Filled 2016-06-03 (×9): qty 1

## 2016-06-03 MED ORDER — VERAPAMIL HCL ER 180 MG PO TBCR
180.0000 mg | EXTENDED_RELEASE_TABLET | Freq: Every day | ORAL | Status: DC
  Administered 2016-06-03 – 2016-06-05 (×3): 180 mg via ORAL
  Filled 2016-06-03 (×3): qty 1

## 2016-06-03 MED ORDER — GLYCOPYRROLATE 1 MG PO TABS
2.0000 mg | ORAL_TABLET | Freq: Two times a day (BID) | ORAL | Status: DC
  Administered 2016-06-03 – 2016-06-06 (×6): 2 mg via ORAL
  Filled 2016-06-03 (×7): qty 2

## 2016-06-03 MED ORDER — SODIUM CHLORIDE 0.9% FLUSH: 3.0000 mL | INTRAVENOUS | Status: DC | PRN

## 2016-06-03 MED ORDER — HYDRALAZINE HCL 20 MG/ML IJ SOLN: 5.0000 mg | INTRAMUSCULAR | Status: DC | PRN

## 2016-06-03 MED ORDER — SODIUM CHLORIDE 0.9% FLUSH
3.0000 mL | Freq: Two times a day (BID) | INTRAVENOUS | Status: DC
  Administered 2016-06-03: 23:00:00 via INTRAVENOUS
  Administered 2016-06-04 – 2016-06-06 (×3): 3 mL via INTRAVENOUS

## 2016-06-03 MED ORDER — LIDOCAINE 5 % EX PTCH
1.0000 | MEDICATED_PATCH | CUTANEOUS | Status: DC
  Administered 2016-06-03 – 2016-06-05 (×3): 1 via TRANSDERMAL
  Filled 2016-06-03 (×3): qty 1

## 2016-06-03 MED ORDER — SODIUM CHLORIDE 0.9 % IV SOLN: 250.0000 mL | INTRAVENOUS | Status: DC | PRN

## 2016-06-03 NOTE — ED Notes (Signed)
Patient here with productive cough that she describes as productive for the past month, denies any pain, NAD. No associated fever, no shortness of breath

## 2016-06-03 NOTE — ED Notes (Signed)
Pt's son called and stated, Im a physician and Dr. Carlis Abbott is suppose to admit her. I explained to him, Dr. Loletta Specter will have to come in to admit her, if he wants her admitted.  Son stated, he would call and see what he could do.

## 2016-06-03 NOTE — ED Provider Notes (Signed)
CSN: [id number]     Arrival date & time 06/03/16  1102 History   First MD Initiated Contact with Patient 06/03/16 1520     Chief Complaint  Patient presents with  . Cough     (Consider location/radiation/quality/duration/timing/severity/associated sxs/prior Treatment) HPI History presents with 2 weeks of coughing productive of clear sputum. She's had increased weakness with several falls 3 days ago. No head or neck trauma. Caretaker states patient has had increased urinary frequency and increasing chills. Denies fevers. No new lower extremity swelling. Past Medical History  Diagnosis Date  . Hypertension   . Complete heart block (HCC)     a. s/p MDT dual chamber PPM   . Chronic diastolic (congestive) heart failure ([place])   . CKD (chronic kidney disease), stage IV [facility])    Past Surgical History  Procedure Laterality Date  . Permanent pacemaker insertion N/A 08/04/2014    Procedure: PERMANENT PACEMAKER INSERTION;  Surgeon: Deboraha Sprang, MD;  Location: [facility] CATH LAB;  Service: Cardiovascular;  Laterality: N/A;   Family History  Problem Relation Age of Onset  . Hypertension Mother   . Heart attack Father    Social History  Substance Use Topics  . Smoking status: Never Smoker   . Smokeless tobacco: None  . Alcohol Use: No   OB History    No data available     Review of Systems  Constitutional: Positive for chills, activity change, appetite change and fatigue. Negative for fever.  Respiratory: Positive for cough. Negative for chest tightness and shortness of breath.   Cardiovascular: Negative for chest pain, palpitations and leg swelling.  Gastrointestinal: Negative for nausea, vomiting, abdominal pain and diarrhea.  Genitourinary: Positive for frequency. Negative for dysuria and hematuria.  Musculoskeletal: Negative for back pain, neck pain and neck stiffness.  Skin: Negative for rash and wound.  Neurological: Positive for weakness (generalized). Negative for dizziness,  syncope, light-headedness, numbness and headaches.  All other systems reviewed and are negative.     Allergies  Codeine; Sulfa antibiotics; Lactose intolerance (gi); and Milk-related compounds  Home Medications   Prior to Admission medications   Medication Sig Start Date End Date Taking? Authorizing Provider  acetaminophen (TYLENOL) 325 MG tablet Take 1-2 tablets (325-650 mg total) by mouth every 4 (four) hours as needed for mild pain. 08/06/14   Erlene Quan, PA-C  ALPRAZolam [name]) 0.5 MG tablet Take 1 tablet (0.5 mg total) by mouth at bedtime as needed for anxiety. 04/29/16   Mir Marry Guan, MD  aspirin 81 MG tablet Take 81 mg by mouth daily.      Historical Provider, MD  calcium-vitamin D (OSCAL WITH D) 500-200 MG-UNIT per tablet Take 1 tablet by mouth daily.      Historical Provider, MD  carvedilol (COREG) 12.5 MG tablet Take 12.5 mg by mouth 2 (two) times daily with a meal.    Historical Provider, MD  cycloSPORINE (RESTASIS) 0.05 % ophthalmic emulsion 1 drop 2 (two) times daily. Both eyes: 1 morning, 1 nightly, PRN for dry-eye syndrome    Historical Provider, MD  docusate (COLACE) 50 MG/5ML liquid Take 10 mLs (100 mg total) by mouth 2 (two) times daily as needed for mild constipation. 04/29/16   Mir Marry Guan, MD  glycopyrrolate (ROBINUL) 2 MG tablet Take 2 mg by mouth 2 (two) times daily.    Historical Provider, MD  lidocaine (LIDODERM) 5 % Place 1 patch onto the skin daily.  08/01/14   Historical Provider, MD  ondansetron (ZOFRAN-ODT) 4  MG disintegrating tablet Take 1 tablet (4 mg total) by mouth every 8 (eight) hours as needed for nausea or vomiting. 04/29/16   Mir Marry Guan, MD  senna (SENOKOT) 8.6 MG TABS tablet Take 2 tablets (17.2 mg total) by mouth daily. 04/29/16   Mir Marry Guan, MD  verapamil (CALAN-SR) 180 MG CR tablet Take 180 mg by mouth at bedtime.    Historical Provider, MD   BP 154/141 mmHg  Pulse 71  Temp(Src) 98.3 F (36.8 C) (Oral)   Resp 20  SpO2 97% Physical Exam  Constitutional: She is oriented to person, place, and time. She appears well-developed and well-nourished. No distress.  HENT:  Head: Normocephalic and atraumatic.  Mouth/Throat: Oropharynx is clear and moist. No oropharyngeal exudate.  Eyes: EOM are normal. Pupils are equal, round, and reactive to light.  Neck: Normal range of motion. Neck supple. No JVD present.  Cardiovascular: Normal rate and regular rhythm.  Exam reveals no gallop and no friction rub.   No murmur heard. Pulmonary/Chest: Effort normal. No respiratory distress. She has no wheezes. She has rales (few scattered rhonchi especially in the right chest).  Abdominal: Soft. Bowel sounds are normal. She exhibits no distension and no mass. There is no tenderness. There is no rebound and no guarding.  Musculoskeletal: Normal range of motion. She exhibits edema. She exhibits no tenderness.  1+ bilateral pitting edema. No calf swelling or asymmetry. Distal pulses intact.  Neurological: She is alert and oriented to person, place, and time.  Moves all extremities without deficit. Sensation is fully intact.  Skin: Skin is warm and dry. No rash noted. No erythema.  Psychiatric: She has a normal mood and affect. Her behavior is normal.  Nursing note and vitals reviewed.   ED Course  Procedures (including critical care time) Labs Review Labs Reviewed  URINALYSIS, ROUTINE W REFLEX MICROSCOPIC (NOT AT [facility]) - Abnormal; Notable for the following:    APPearance TURBID (*)    Ketones, ur 15 (*)    Leukocytes, UA LARGE (*)    All other components within normal limits  COMPREHENSIVE METABOLIC PANEL - Abnormal; Notable for the following:    Glucose, Bld 125 (*)    Creatinine, Ser 1.08 (*)    ALT 10 (*)    GFR calc non Af Amer 43 (*)    GFR calc Af Amer 50 (*)    All other components within normal limits  BRAIN NATRIURETIC PEPTIDE - Abnormal; Notable for the following:    B Natriuretic Peptide 500.1 (*)     All other components within normal limits  URINE MICROSCOPIC-ADD ON - Abnormal; Notable for the following:    Squamous Epithelial / LPF 6-30 (*)    Bacteria, UA MANY (*)    Casts HYALINE CASTS (*)    All other components within normal limits  I-STAT TROPOININ, ED - Abnormal; Notable for the following:    Troponin i, poc 0.15 (*)    All other components within normal limits  I-STAT TROPOININ, ED - Abnormal; Notable for the following:    Troponin i, poc 0.15 (*)    All other components within normal limits  URINE CULTURE  CBC WITH DIFFERENTIAL/PLATELET  CBC WITH DIFFERENTIAL/PLATELET    Imaging Review Dg Chest 2 View  06/03/2016  CLINICAL DATA:  Cough for approximately 1 month. Occasional shortness of breath EXAM: CHEST  2 VIEW COMPARISON:  Chest radiograph April 24, 2016 and chest CT April 25, 2016 FINDINGS: There is stable elevation of the right  hemidiaphragm. There is no edema or consolidation. Heart is upper normal in size with pulmonary vascularity within normal limits. Pacemaker leads are attached to the right atrium and right ventricle. There are multiple calcified lymph nodes in the right hilum, azygos, and sub- carinal regions consistent with prior granulomatous disease. No adenopathy is evident. Prominence in the right peritracheal region is stable and appears to represent prominent vessels based on the recent CT appearance. No bone lesions are evident. IMPRESSION: Stable elevation of the right hemidiaphragm. No edema or consolidation. Stable cardiac silhouette. Evidence of prior granulomatous disease with multiple calcified lymph nodes. Electronically Signed   By: Lowella Grip III M.D.   On: 06/03/2016 12:35   I have personally reviewed and evaluated these images and lab results as part of my medical decision-making.   EKG Interpretation   Date/Time:  Tuesday June 03 2016 16:19:32 EDT Ventricular Rate:  74 PR Interval:    QRS Duration: 165 QT Interval:  448 QTC  Calculation: 498 R Axis:   -75 Text Interpretation:  Sinus rhythm Left bundle branch block Confirmed by  Alandria Butkiewicz  MD, Isaih Bulger ([id number]) on 06/03/2016 8:32:05 PM      MDM   Final diagnoses:  UTI (lower urinary tract infection)  Elevated troponin  Peripheral edema    Patient with mild elevation in troponin but unchanged on 3 hour repeat. Likely UTI given urinary symptoms. Patient does have elevation in BNP and evidence of peripheral edema. Suspect some degree of CHF. This may be responsible for her coughing and shortness of breath for the past month. Suspect her generalized weakness due to urinary tract infection. Given IV Rocephin in the emergency department. We'll consult medicine for admit.    Julianne Rice, MD 06/03/16 2122

## 2016-06-03 NOTE — ED Notes (Signed)
IV team at bedside 

## 2016-06-03 NOTE — ED Notes (Signed)
Pt able to stand and pivot to use bedside commode.

## 2016-06-03 NOTE — ED Notes (Signed)
Brought patient back to room, patient undressed, in gown, on continuous pulse oximetry and blood pressure cuff; visitor at bedside

## 2016-06-03 NOTE — ED Notes (Signed)
Discussed troponin .15 w/ Dr. Lita Mains. No new orders at this time.

## 2016-06-03 NOTE — H&P (Addendum)
History and Physical    Victoria Lloyd [id number] DOB: 01/04/1922 DOA: 06/03/2016  Referring MD/NP/PA:   PCP: [name], MD   Patient coming from:  The patient is coming from home.  At baseline, pt is independent for most of ADL.       Chief Complaint: Cough and SOB  HPI: Victoria Lloyd is a 82 y.o. female with medical history significant of chronic diastolic congestive heart failure, CKD-IV, hypertension, anxiety, complete heart block, s/p of PPM, who presents with cough.  Patient reports that she has been having cough for almost a month, with white mucus production. Patient has mild SOB, but no chest pain, fever or chills. She has leg edema, but no tenderness over calf areas. Patient denies symptoms of UTI (No dysuria, burning on urination or urinary frequency or urgency). No nausea, vomiting, abdominal pain, diarrhea, unilateral weakness.  ED Course: pt was found to have troponin 0.15-->0.15-->0.19, BNP 500, WBC 9.2, temperature normal, no tachycardia, renal function OK, urinalysis with large amount of leukocyte, but obese 6-30 of squamous cells. Chest x-ray show stable elevation of the right hemidiaphragm. No edema or consolidation. Stable cardiac silhouette. Evidence of prior granulomatous disease with multiple calcified lymph nodes. Pt is placed on tele bed for obs.  Review of Systems:   General: no fevers, chills, no changes in body weight, has fatigue HEENT: no blurry vision, hearing changes or sore throat Pulm: no dyspnea, has coughing, no wheezing CV: no chest pain, no palpitations Abd: no nausea, vomiting, abdominal pain, diarrhea, constipation GU: no dysuria, burning on urination, increased urinary frequency, hematuria  Ext: has trace leg edema Neuro: no unilateral weakness, numbness, or tingling, no vision change or hearing loss Skin: no rash MSK: No muscle spasm, no deformity, no limitation of range of movement in spin Heme: No easy bruising.  Travel  history: No recent long distant travel.  Allergy:  Allergies  Allergen Reactions  . Codeine Other (See Comments)    Unknown; patient cannot recall the reaction  . Sulfa Antibiotics Other (See Comments)    Unknown; patient cannot recall the reaction  . Lactose Intolerance (Gi) Nausea And Vomiting  . Milk-Related Compounds Nausea And Vomiting    Past Medical History  Diagnosis Date  . Hypertension   . Complete heart block (HCC)     a. s/p MDT dual chamber PPM   . Chronic diastolic (congestive) heart failure ([place])   . CKD (chronic kidney disease), stage IV [facility])     Past Surgical History  Procedure Laterality Date  . Permanent pacemaker insertion N/A 08/04/2014    Procedure: PERMANENT PACEMAKER INSERTION;  Surgeon: Deboraha Sprang, MD;  Location: [facility] CATH LAB;  Service: Cardiovascular;  Laterality: N/A;    Social History:  reports that she has never smoked. She does not have any smokeless tobacco history on file. She reports that she does not drink alcohol or use illicit drugs.  Family History:  Family History  Problem Relation Age of Onset  . Hypertension Mother   . Heart attack Father      Prior to Admission medications   Medication Sig Start Date End Date Taking? Authorizing Provider  acetaminophen (TYLENOL) 325 MG tablet Take 1-2 tablets (325-650 mg total) by mouth every 4 (four) hours as needed for mild pain. 08/06/14   Erlene Quan, PA-C  ALPRAZolam [name]) 0.5 MG tablet Take 1 tablet (0.5 mg total) by mouth at bedtime as needed for anxiety. 04/29/16   Mir Marry Guan, MD  aspirin 81 MG tablet Take 81 mg by mouth daily.      Historical Provider, MD  calcium-vitamin D (OSCAL WITH D) 500-200 MG-UNIT per tablet Take 1 tablet by mouth daily.      Historical Provider, MD  carvedilol (COREG) 12.5 MG tablet Take 12.5 mg by mouth 2 (two) times daily with a meal.    Historical Provider, MD  cycloSPORINE (RESTASIS) 0.05 % ophthalmic emulsion 1 drop 2 (two) times daily. Both  eyes: 1 morning, 1 nightly, PRN for dry-eye syndrome    Historical Provider, MD  docusate (COLACE) 50 MG/5ML liquid Take 10 mLs (100 mg total) by mouth 2 (two) times daily as needed for mild constipation. 04/29/16   Mir Marry Guan, MD  glycopyrrolate (ROBINUL) 2 MG tablet Take 2 mg by mouth 2 (two) times daily.    Historical Provider, MD  lidocaine (LIDODERM) 5 % Place 1 patch onto the skin daily.  08/01/14   Historical Provider, MD  ondansetron (ZOFRAN-ODT) 4 MG disintegrating tablet Take 1 tablet (4 mg total) by mouth every 8 (eight) hours as needed for nausea or vomiting. 04/29/16   Mir Marry Guan, MD  senna (SENOKOT) 8.6 MG TABS tablet Take 2 tablets (17.2 mg total) by mouth daily. 04/29/16   Mir Marry Guan, MD  verapamil (CALAN-SR) 180 MG CR tablet Take 180 mg by mouth at bedtime.    Historical Provider, MD    Physical Exam: Filed Vitals:   06/03/16 2100 06/03/16 2130 06/03/16 2218 06/03/16 2221  BP: 158/74 154/65  160/74  Pulse: 71 69  71  Temp:    98.4 F (36.9 C)  TempSrc:    Oral  Resp: 18 22  20   Height:    5\' 2"  (1.575 m)  Weight:   62.2 kg (137 lb 2 oz)   SpO2: 96% 97%  100%   General: Not in acute distress HEENT:       Eyes: PERRL, EOMI, no scleral icterus.       ENT: No discharge from the ears and nose, no pharynx injection, no tonsillar enlargement.        Neck: positive JVD, no bruit, no mass felt. Heme: No neck lymph node enlargement. Cardiac: S1/S2, RRR, No murmurs, No gallops or rubs. Pulm:  No rales, wheezing, rhonchi or rubs. Abd: Soft, nondistended, nontender, no rebound pain, no organomegaly, BS present. GU: No hematuria Ext: has trace leg edema bilaterally. 2+DP/PT pulse bilaterally. Musculoskeletal: No joint deformities, No joint redness or warmth, no limitation of ROM in spin. Skin: No rashes.  Neuro: Alert, oriented X3, cranial nerves II-XII grossly intact, moves all extremities normally.  Psych: Patient is not psychotic, no suicidal  or hemocidal ideation.  Labs on Admission: I have personally reviewed following labs and imaging studies  CBC:  Recent Labs Lab 06/03/16 1635  WBC 9.2  NEUTROABS 6.9  HGB 14.0  HCT 44.2  MCV 91.7  PLT 0000000   Basic Metabolic Panel:  Recent Labs Lab 06/03/16 1635  NA 142  K 3.9  CL 103  CO2 29  GLUCOSE 125*  BUN 15  CREATININE 1.08*  CALCIUM 10.2   GFR: Estimated Creatinine Clearance: 28.8 mL/min (by C-G formula based on Cr of 1.08). Liver Function Tests:  Recent Labs Lab 06/03/16 1635  AST 17  ALT 10*  ALKPHOS 55  BILITOT 0.7  PROT 7.2  ALBUMIN 3.6   No results for input(s): LIPASE, AMYLASE in the last 168 hours. No results for input(s): AMMONIA in the last  168 hours. Coagulation Profile:  Recent Labs Lab 06/03/16 2318  INR 1.40   Cardiac Enzymes:  Recent Labs Lab 06/03/16 2318  TROPONINI 0.19*   BNP (last 3 results) No results for input(s): PROBNP in the last 8760 hours. HbA1C: No results for input(s): HGBA1C in the last 72 hours. CBG: No results for input(s): GLUCAP in the last 168 hours. Lipid Profile: No results for input(s): CHOL, HDL, LDLCALC, TRIG, CHOLHDL, LDLDIRECT in the last 72 hours. Thyroid Function Tests: No results for input(s): TSH, T4TOTAL, FREET4, T3FREE, THYROIDAB in the last 72 hours. Anemia Panel: No results for input(s): VITAMINB12, FOLATE, FERRITIN, TIBC, IRON, RETICCTPCT in the last 72 hours. Urine analysis:    Component Value Date/Time   COLORURINE YELLOW 06/03/2016 1730   APPEARANCEUR TURBID* 06/03/2016 1730   LABSPEC 1.023 06/03/2016 1730   PHURINE 7.0 06/03/2016 1730   GLUCOSEU NEGATIVE 06/03/2016 1730   HGBUR NEGATIVE 06/03/2016 1730   BILIRUBINUR NEGATIVE 06/03/2016 1730   KETONESUR 15* 06/03/2016 1730   PROTEINUR NEGATIVE 06/03/2016 1730   UROBILINOGEN 0.2 07/04/2009 1910   NITRITE NEGATIVE 06/03/2016 1730   LEUKOCYTESUR LARGE* 06/03/2016 1730   Sepsis  Labs: @LABRCNTIP (procalcitonin:4,lacticidven:4) )No results found for this or any previous visit (from the past 240 hour(s)).   Radiological Exams on Admission: Dg Chest 2 View  06/03/2016  CLINICAL DATA:  Cough for approximately 1 month. Occasional shortness of breath EXAM: CHEST  2 VIEW COMPARISON:  Chest radiograph April 24, 2016 and chest CT April 25, 2016 FINDINGS: There is stable elevation of the right hemidiaphragm. There is no edema or consolidation. Heart is upper normal in size with pulmonary vascularity within normal limits. Pacemaker leads are attached to the right atrium and right ventricle. There are multiple calcified lymph nodes in the right hilum, azygos, and sub- carinal regions consistent with prior granulomatous disease. No adenopathy is evident. Prominence in the right peritracheal region is stable and appears to represent prominent vessels based on the recent CT appearance. No bone lesions are evident. IMPRESSION: Stable elevation of the right hemidiaphragm. No edema or consolidation. Stable cardiac silhouette. Evidence of prior granulomatous disease with multiple calcified lymph nodes. Electronically Signed   By: Lowella Grip III M.D.   On: 06/03/2016 12:35     EKG: Independently reviewed. Paced rhythm, QTC 498, left bundle blockage, LAD.  Assessment/Plan Principal Problem:   Acute on chronic diastolic (congestive) heart failure (HCC) Active Problems:   Hypertension   AV block, 3rd degree (HCC)   CKD (chronic kidney disease) stage 4, GFR 15-29 ml/min (HCC)   Pacemaker- [employer] impalnted 08/04/14   Anxiety   SOB (shortness of breath)   Elevated troponin   CHF exacerbation (HCC)   Acute on chronic diastolic (congestive) heart failure ([place]): pt's cough is likely due to CHF exacerbation. Though she only has trace leg edema, she has an elevated trop 500 and positive JVD, consistent with mild CHF exacerbation. No infiltration on chest x-ray.  -will place on tele bed for  obs -Lasix 20 mg daily by IV (pt is lasix nave) -will not do 2d echo since pt just had one on 04/26/16, which showed EF of 55-60 percent with grade 1 diastolic dysfunction. -will continue home coreg and ASA -Daily weights -strict I/O's -Low salt diet -No lisinopril due to CKD-IV -prn albuterol nebs and muxinex for cough  Elevated troponin: trop 0.15-->0.15-->0.19. No CP. Likely due to demanding ischemia 2/2 CHF exacerbation. - cycle CE q6 x3 and repeat her EKG in the am  -  Aspirin - Risk factor stratification: will check FLP and A1C  - please call Card in AM  HTN: Blood pressure 154/141 -continue verpamil and coreg -on IV lasix -IV hydralazine when necessary  CKD-IV: stable. Her creatinine is 1.08 and BUN 15 on admission. -Follow-up renal function by BMP  Anxiety: stable -continue prn Xanax  Questionable UTI: Urinalysis with large amount of leukocytes, but with 6-30 squamous cells, patient denies any symptoms of UTI. Patient received 1 dose of Rocephin in ED, will hold antibiotics now. -Follow-up urine culture   DVT ppx: SQ Heparin (if pt develops severe chest pain or significantly elevated trop, will be easier to switch to IV heparin or stop heparin for procedure than using Lovenox).   Code Status: Full code Family Communication: None at bed side.  Disposition Plan:  Anticipate discharge back to previous home environment Consults called:  none Admission status: Obs / tele   Date of Service 06/04/2016    Ivor Costa [facility] Pager [phone]  If 7PM-7AM, please contact night-coverage [email or web address] Password [email or web address] 06/04/2016, 4:10 AM

## 2016-06-03 NOTE — ED Notes (Signed)
Dr Porfirio Mylar at bedside.

## 2016-06-03 NOTE — ED Notes (Signed)
2 RNs from IV team attempted IV access w/o success. Dr. Lita Mains, [place] aware.

## 2016-06-04 DIAGNOSIS — I130 Hypertensive heart and chronic kidney disease with heart failure and stage 1 through stage 4 chronic kidney disease, or unspecified chronic kidney disease: Secondary | ICD-10-CM | POA: Diagnosis not present

## 2016-06-04 DIAGNOSIS — F419 Anxiety disorder, unspecified: Secondary | ICD-10-CM | POA: Diagnosis not present

## 2016-06-04 DIAGNOSIS — I5033 Acute on chronic diastolic (congestive) heart failure: Secondary | ICD-10-CM | POA: Diagnosis not present

## 2016-06-04 DIAGNOSIS — R0602 Shortness of breath: Secondary | ICD-10-CM

## 2016-06-04 DIAGNOSIS — R7989 Other specified abnormal findings of blood chemistry: Secondary | ICD-10-CM

## 2016-06-04 DIAGNOSIS — N184 Chronic kidney disease, stage 4 (severe): Secondary | ICD-10-CM | POA: Diagnosis not present

## 2016-06-04 DIAGNOSIS — I10 Essential (primary) hypertension: Secondary | ICD-10-CM

## 2016-06-04 DIAGNOSIS — N390 Urinary tract infection, site not specified: Secondary | ICD-10-CM | POA: Diagnosis not present

## 2016-06-04 LAB — LIPID PANEL
CHOL/HDL RATIO: 4.2 ratio
Cholesterol: 181 mg/dL (ref 0–200)
HDL: 43 mg/dL (ref >40)
LDL CALC: 117 mg/dL — AB (ref 0–99)
Triglycerides: 105 mg/dL (ref <150)
VLDL: 21 mg/dL (ref 0–40)

## 2016-06-04 LAB — BASIC METABOLIC PANEL
Anion gap: 11 (ref 5–15)
BUN: 16 mg/dL (ref 6–20)
CALCIUM: 10.2 mg/dL (ref 8.9–10.3)
CO2: 26 mmol/L (ref 22–32)
CREATININE: 1.03 mg/dL — AB (ref 0.44–1.00)
Chloride: 104 mmol/L (ref 101–111)
GFR, EST AFRICAN AMERICAN: 53 mL/min — AB (ref >60)
GFR, EST NON AFRICAN AMERICAN: 46 mL/min — AB (ref >60)
Glucose, Bld: 102 mg/dL — ABNORMAL HIGH (ref 65–99)
Potassium: 3.5 mmol/L (ref 3.5–5.1)
SODIUM: 141 mmol/L (ref 135–145)

## 2016-06-04 LAB — TROPONIN I
TROPONIN I: 0.16 ng/mL — AB (ref <0.03)
Troponin I: 0.16 ng/mL (ref <0.03)

## 2016-06-04 LAB — HEMOGLOBIN A1C
HEMOGLOBIN A1C: 5.8 % — AB (ref 4.8–5.6)
MEAN PLASMA GLUCOSE: 120 mg/dL

## 2016-06-04 NOTE — Progress Notes (Signed)
PROGRESS NOTE    LOUDELL HIRANI  [id number] DOB: 1922/05/15 DOA: 06/03/2016 PCP: [name], MD    Brief Narrative:  HPI on 06/03/2016 by Dr. Ivor Costa Victoria Lloyd is a 82 y.o. female with medical history significant of chronic diastolic congestive heart failure, CKD-IV, hypertension, anxiety, complete heart block, s/p of PPM, who presents with cough. Patient reports that she has been having cough for almost a month, with white mucus production. Patient has mild SOB, but no chest pain, fever or chills. She has leg edema, but no tenderness over calf areas. Patient denies symptoms of UTI (No dysuria, burning on urination or urinary frequency or urgency). No nausea, vomiting, abdominal pain, diarrhea, unilateral weakness.  Assessment & Plan   Acute on chronic diastolic (congestive) heart failure (HCC) -BNP at admission 500 -CXR showed stable elevation of right hemidiaphragm, no edema or consolidation  -Placed on IV lasix 20mg  daily -Monitor intake/output, daily weights -Echocardiogram 04/26/2016: EF 0000000, grade 1 diastolic dysfunction -continue coreg, aspirin -No ACEI due to CKD  Elevated troponin -Likely secondary to demand ischemia and the above -Troponin peaked at 0.19 currently 0.16 -Continue aspirin, coreg  -LDL 117  Essential hypertension -continue verpamil and coreg -on IV lasix -IV hydralazine when necessary  CKD-IV -Stable.  -Creatinine currently 103 -Continue to monitor BMP closely secondary to diuresis  Anxiety -Stable, continue prn Xanax  Questionable UTI/Asymptomatic bacteruria -UA: 6-30 WBC/sq epithelial, Many bacteria, large leukocytes -Urine culture pending  -Patient received 1 dose of rocephin in the ER -Follow-up urine culture  DVT Prophylaxis  heparin  Code Status: Full  Family Communication: None at bedside  Disposition Plan: Admitted  Consultants None  Procedures  None  Antibiotics   Anti-infectives    Start      Dose/Rate Route Frequency Ordered Stop   06/03/16 1930  cefTRIAXone (ROCEPHIN) 1 g in dextrose 5 % 50 mL IVPB     1 g 100 mL/hr over 30 Minutes Intravenous  Once 06/03/16 1926 06/03/16 2017      Subjective:   Posey Pronto Leeds seen and examined today.  Patient states she is feeling better than previous days. Denies chest pain. Feels cough and breathing have improved. Denies leg swelling. Denies abdominal pain, nausea, vomiting, headache, dizziness.   Objective:   Filed Vitals:   06/03/16 2130 06/03/16 2218 06/03/16 2221 06/04/16 0544  BP: 154/65  160/74 117/54  Pulse: 69  71 65  Temp:   98.4 F (36.9 C) 98.2 F (36.8 C)  TempSrc:   Oral Oral  Resp: 22  20 20   Height:   5\' 2"  (1.575 m)   Weight:  62.2 kg (137 lb 2 oz)  61.5 kg (135 lb 9.3 oz)  SpO2: 97%  100% 95%    Intake/Output Summary (Last 24 hours) at 06/04/16 1158 Last data filed at 06/04/16 0841  Gross per 24 hour  Intake      0 ml  Output    202 ml  Net   -202 ml   Filed Weights   06/03/16 2218 06/04/16 0544  Weight: 62.2 kg (137 lb 2 oz) 61.5 kg (135 lb 9.3 oz)    Exam  General: Well developed, well nourished, NAD, appears stated age  HEENT: NCAT, mucous membranes moist.   Cardiovascular: S1 S2 auscultated, no rubs, murmurs or gallops. Regular rate and rhythm.  Respiratory: Clear to auscultation bilaterally with equal chest rise, occ cough  Abdomen: Soft, nontender, nondistended, + bowel sounds  Extremities: warm dry without cyanosis clubbing  or edema  Neuro: AAOx3, nonfocal  Psych: Normal affect and demeanor with intact judgement and insight   Data Reviewed: I have personally reviewed following labs and imaging studies  CBC:  Recent Labs Lab 06/03/16 1635  WBC 9.2  NEUTROABS 6.9  HGB 14.0  HCT 44.2  MCV 91.7  PLT 0000000   Basic Metabolic Panel:  Recent Labs Lab 06/03/16 1635 06/04/16 0419  NA 142 141  K 3.9 3.5  CL 103 104  CO2 29 26  GLUCOSE 125* 102*  BUN 15 16  CREATININE  1.08* 1.03*  CALCIUM 10.2 10.2   GFR: Estimated Creatinine Clearance: 30.1 mL/min (by C-G formula based on Cr of 1.03). Liver Function Tests:  Recent Labs Lab 06/03/16 1635  AST 17  ALT 10*  ALKPHOS 55  BILITOT 0.7  PROT 7.2  ALBUMIN 3.6   No results for input(s): LIPASE, AMYLASE in the last 168 hours. No results for input(s): AMMONIA in the last 168 hours. Coagulation Profile:  Recent Labs Lab 06/03/16 2318  INR 1.40   Cardiac Enzymes:  Recent Labs Lab 06/03/16 2318 06/04/16 0419 06/04/16 0959  TROPONINI 0.19* 0.16* 0.16*   BNP (last 3 results) No results for input(s): PROBNP in the last 8760 hours. HbA1C: No results for input(s): HGBA1C in the last 72 hours. CBG: No results for input(s): GLUCAP in the last 168 hours. Lipid Profile:  Recent Labs  06/04/16 0419  CHOL 181  HDL 43  LDLCALC 117*  TRIG 105  CHOLHDL 4.2   Thyroid Function Tests: No results for input(s): TSH, T4TOTAL, FREET4, T3FREE, THYROIDAB in the last 72 hours. Anemia Panel: No results for input(s): VITAMINB12, FOLATE, FERRITIN, TIBC, IRON, RETICCTPCT in the last 72 hours. Urine analysis:    Component Value Date/Time   COLORURINE YELLOW 06/03/2016 1730   APPEARANCEUR TURBID* 06/03/2016 1730   LABSPEC 1.023 06/03/2016 1730   PHURINE 7.0 06/03/2016 1730   GLUCOSEU NEGATIVE 06/03/2016 1730   HGBUR NEGATIVE 06/03/2016 1730   BILIRUBINUR NEGATIVE 06/03/2016 1730   KETONESUR 15* 06/03/2016 1730   PROTEINUR NEGATIVE 06/03/2016 1730   UROBILINOGEN 0.2 07/04/2009 1910   NITRITE NEGATIVE 06/03/2016 1730   LEUKOCYTESUR LARGE* 06/03/2016 1730   Sepsis Labs: @LABRCNTIP (procalcitonin:4,lacticidven:4)  )No results found for this or any previous visit (from the past 240 hour(s)).    Radiology Studies: Dg Chest 2 View  06/03/2016  CLINICAL DATA:  Cough for approximately 1 month. Occasional shortness of breath EXAM: CHEST  2 VIEW COMPARISON:  Chest radiograph April 24, 2016 and chest CT April 25, 2016 FINDINGS: There is stable elevation of the right hemidiaphragm. There is no edema or consolidation. Heart is upper normal in size with pulmonary vascularity within normal limits. Pacemaker leads are attached to the right atrium and right ventricle. There are multiple calcified lymph nodes in the right hilum, azygos, and sub- carinal regions consistent with prior granulomatous disease. No adenopathy is evident. Prominence in the right peritracheal region is stable and appears to represent prominent vessels based on the recent CT appearance. No bone lesions are evident. IMPRESSION: Stable elevation of the right hemidiaphragm. No edema or consolidation. Stable cardiac silhouette. Evidence of prior granulomatous disease with multiple calcified lymph nodes. Electronically Signed   By: Lowella Grip III M.D.   On: 06/03/2016 12:35     Scheduled Meds: . aspirin  81 mg Oral Daily  . calcium-vitamin D  1 tablet Oral Daily  . carvedilol  12.5 mg Oral BID WC  . cycloSPORINE  1  drop Both Eyes BID  . dextromethorphan-guaiFENesin  1 tablet Oral BID  . furosemide  20 mg Intravenous Daily  . glycopyrrolate  2 mg Oral BID  . heparin  5,000 Units Subcutaneous Q8H  . lidocaine  1 patch Transdermal Q24H  . senna  2 tablet Oral Daily  . sodium chloride flush  3 mL Intravenous Q12H  . verapamil  180 mg Oral QHS   Continuous Infusions:      Time Spent in minutes   30 minutes  Jariyah Hackley D.O. on 06/04/2016 at 11:58 AM  Between 7am to 7pm - Pager - [phone]  After 7pm go to [email or web address] - password [email or web address]  And look for the night coverage person covering for me after hours  [facility] Office  [phone]

## 2016-06-04 NOTE — Progress Notes (Signed)
Occupational Therapy Evaluation Patient Details Name: Victoria Lloyd MRN: [id number] DOB: 1922/05/20 Today's Date: 06/04/2016    History of Present Illness 82 y.o. female admitted for productive cough for the past month. PMH significant for HTN, diastolic CHF, CKD stage IV, and complete heart block s/p MDT dual chamber PPM.   Clinical Impression   PTA, pt was independent with all ADLs and used SPC or RW for mobility. Pt currently requires min guard assist for all ADLs and basic transfers due to increased confusion and balance deficits. Pt plans to d/c home with intermittent assistance from her son, friends and neighbors. Pt will benefit from continued acute OT to increase independence and safety with ADLs and mobility to allow for safe discharge home. Recommend HHOT upon d/c.    Follow Up Recommendations  Home health OT;Supervision/Assistance - 24 hour (as long as family can provide 24/7 supervision due to pt's confusion)   Equipment Recommendations  None recommended by OT    Recommendations for Other Services       Precautions / Restrictions Precautions Precautions: Fall Restrictions Weight Bearing Restrictions: No      Mobility Bed Mobility Overal bed mobility: Needs Assistance Bed Mobility: Supine to Sit     Supine to sit: Min guard     General bed mobility comments: Min guard assist for safety. No physical assist required.  Transfers Overall transfer level: Needs assistance Equipment used: Rolling walker (2 wheeled) Transfers: Sit to/from Stand Sit to Stand: Min guard         General transfer comment: Min guard assist for safety. VCs to avoid pushing up on RW x3.     Balance Overall balance assessment: Needs assistance Sitting-balance support: No upper extremity supported;Feet supported Sitting balance-Leahy Scale: Good     Standing balance support: Single extremity supported;During functional activity Standing balance-Leahy Scale: Fair                               ADL Overall ADL's : Needs assistance/impaired     Grooming: Wash/dry hands;Wash/dry face;Oral care;Min guard;Standing   Upper Body Bathing: Min guard;Standing   Lower Body Bathing: Min guard;Sit to/from stand   Upper Body Dressing : Min guard;Standing   Lower Body Dressing: Min guard;Sit to/from stand   Toilet Transfer: Min guard;Ambulation;Regular Toilet;RW   Toileting- Water quality scientist and Hygiene: Min guard;Sit to/from stand       Functional mobility during ADLs: Min guard;Rolling walker       Vision Vision Assessment?: No apparent visual deficits   Perception     Praxis      Pertinent Vitals/Pain Pain Assessment: No/denies pain     Hand Dominance Right   Extremity/Trunk Assessment Upper Extremity Assessment Upper Extremity Assessment: Generalized weakness (weak grip due to arthritis)   Lower Extremity Assessment Lower Extremity Assessment: Defer to PT evaluation   Cervical / Trunk Assessment Cervical / Trunk Assessment: Kyphotic   Communication Communication Communication: No difficulties   Cognition Arousal/Alertness: Awake/alert Behavior During Therapy: WFL for tasks assessed/performed Overall Cognitive Status: Impaired/Different from baseline Area of Impairment: Orientation;Memory;Safety/judgement Orientation Level: Disoriented to;Place;Situation   Memory: Decreased short-term memory   Safety/Judgement: Decreased awareness of safety;Decreased awareness of deficits     General Comments: Pt reports that she feels "puzzled" and is having a hard time understanding and remembering things. Pt was disorientied to place and situation, but was able to accurately respond to all other questions during session.   General Comments  Exercises       Shoulder Instructions      Home Living Family/patient expects to be discharged to:: Private residence Living Arrangements: Alone Available Help at Discharge:  Family;Friend(s);Neighbor;Available PRN/intermittently Type of Home: House Home Access: Stairs to enter [employer] of Steps: 4 Entrance Stairs-Rails: Right;Left;Can reach both Home Layout: One level     Bathroom Shower/Tub: Tub/shower unit Shower/tub characteristics: Curtain Biochemist, clinical: Standard     Home Equipment: Environmental consultant - 2 wheels;Cane - single point;Shower seat;Grab bars - tub/shower;Hand held shower head          Prior Functioning/Environment Level of Independence: Needs assistance  Gait / Transfers Assistance Needed: Uses RW or SPC ADL's / Homemaking Assistance Needed: Independent with all ADLs and most IADLs except pt does not drive and has friend/neighbors drive her to complete errands and to doctor's appointments.        OT Diagnosis: Generalized weakness;Cognitive deficits   OT Problem List: Decreased strength;Decreased activity tolerance;Impaired balance (sitting and/or standing);Decreased cognition;Decreased safety awareness;Decreased knowledge of use of DME or AE   OT Treatment/Interventions: Self-care/ADL training;Therapeutic exercise;Energy conservation;DME and/or AE instruction;Therapeutic activities;Patient/family education;Balance training    OT Goals(Current goals can be found in the care plan section) Acute Rehab OT Goals Patient Stated Goal: for confusion to go away OT Goal Formulation: With patient Time For Goal Achievement: 06/18/16 Potential to Achieve Goals: Good ADL Goals Pt Will Perform Grooming: with modified independence;standing Pt Will Perform Upper Body Bathing: with modified independence;sitting Pt Will Perform Lower Body Bathing: with modified independence;sit to/from stand Pt Will Transfer to Toilet: with modified independence;ambulating;regular height toilet Pt Will Perform Toileting - Clothing Manipulation and hygiene: with modified independence;sit to/from stand Pt Will Perform Tub/Shower Transfer: Tub transfer;with  modified independence;ambulating;shower seat;rolling walker  OT Frequency: Min 2X/week   Barriers to D/C: Decreased caregiver support  lives alone       Co-evaluation              End of Session Equipment Utilized During Treatment: Gait belt;Rolling walker Nurse Communication: Mobility status  Activity Tolerance: Patient tolerated treatment well Patient left: in chair;with call bell/phone within reach;with chair alarm set   Time: [id number] OT Time Calculation (min): 31 min Charges:  OT General Charges $OT Visit: 1 Procedure OT Evaluation $OT Eval Moderate Complexity: 1 Procedure OT Treatments $Self Care/Home Management : 8-22 mins G-Codes: OT G-codes **NOT FOR INPATIENT CLASS** Functional Assessment Tool Used: clinical judgement Functional Limitation: Self care Self Care Current Status [id number]): At least 1 percent but less than 20 percent impaired, limited or restricted Self Care Goal Status [id number]): At least 1 percent but less than 20 percent impaired, limited or restricted  Redmond Baseman, OTR/L Pager: [phone] 06/04/2016, 9:35 AM

## 2016-06-04 NOTE — Care Management Obs Status (Signed)
[place] NOTIFICATION   Patient Details  Name: MARGIA SCHMIDT MRN: [id number] Date of Birth: Jan 06, 1922   Medicare Observation Status Notification Given:  Yes    Dawayne Patricia, RN 06/04/2016, 4:16 PM

## 2016-06-04 NOTE — Evaluation (Signed)
Physical Therapy Evaluation Patient Details Name: Victoria Lloyd MRN: [id number] DOB: 10-05-1922 Today's Date: 06/04/2016   History of Present Illness  82 y.o. female admitted for productive cough for the past month. Workup suggests questionable UTI.  PMH significant for HTN, diastolic CHF, CKD stage IV, and complete heart block s/p MDT dual chamber PPM.    Clinical Impression  Pt admitted with above diagnosis. Pt currently with functional limitations due to the deficits listed below (see PT Problem List). Ms. Fero presents with confusion and currently requires min guard assist for safety with transfers and ambulation.  PTA pt needed assist for driving but otherwise Ind.  Given pt's current confusion and functional status she will need 24/7 assist/supervision at d/c. Pt will benefit from skilled PT to increase their independence and safety with mobility to allow discharge to the venue listed below.      Follow Up Recommendations Home health PT;Supervision/Assistance - 24 hour    Equipment Recommendations  None recommended by PT    Recommendations for Other Services       Precautions / Restrictions Precautions Precautions: Fall Restrictions Weight Bearing Restrictions: No      Mobility  Bed Mobility Overal bed mobility: Needs Assistance Bed Mobility: Supine to Sit     Supine to sit: Min guard     General bed mobility comments: Min guard assist for safety. No physical assist required.  Transfers Overall transfer level: Needs assistance Equipment used: Rolling walker (2 wheeled) Transfers: Sit to/from Stand Sit to Stand: Min guard         General transfer comment: Min guard assist for safety. Proper hand placement to stand; however, requires verbal cues to keep RW with her when turning to sit.  Ambulation/Gait Ambulation/Gait assistance: Min guard Ambulation Distance (Feet): 90 Feet Assistive device: Rolling walker (2 wheeled) Gait Pattern/deviations:  Step-through pattern;Step-to pattern;Trunk flexed;Decreased stance time - left;Decreased step length - right Gait velocity: decreased   General Gait Details: Cues for upright posture and foward gaze.  Step through pattern with occassional step to with dec stance time Lt LE.    Stairs            Wheelchair Mobility    Modified Rankin (Stroke Patients Only)       Balance Overall balance assessment: Needs assistance Sitting-balance support: No upper extremity supported;Feet supported Sitting balance-Leahy Scale: Good     Standing balance support: During functional activity;Single extremity supported Standing balance-Leahy Scale: Fair                               Pertinent Vitals/Pain Pain Assessment: No/denies pain    Home Living Family/patient expects to be discharged to:: Private residence Living Arrangements: Alone Available Help at Discharge: Family;Friend(s);Neighbor;Available PRN/intermittently Type of Home: House Home Access: Stairs to enter Entrance Stairs-Rails: Right;Left;Can reach both Entrance Stairs-Number of Steps: 4 Home Layout: One level Home Equipment: Walker - 2 wheels;Cane - single point;Shower seat;Grab bars - tub/shower;Hand held shower head      Prior Function Level of Independence: Needs assistance   Gait / Transfers Assistance Needed: Uses RW or SPC. Reports 2 falls in the past year.  Says she does not think she is tripping but rather, "I just found myself on the floor".  ADL's / Homemaking Assistance Needed: Independent with all ADLs and most IADLs except pt does not drive and has friend/neighbors drive her to complete errands and to doctor's appointments.  Hand Dominance   Dominant Hand: Right    Extremity/Trunk Assessment   Upper Extremity Assessment: Defer to OT evaluation           Lower Extremity Assessment: LLE deficits/detail   LLE Deficits / Details: not able to formally assist with resistance due to  allodynia  Cervical / Trunk Assessment: Kyphotic  Communication   Communication: No difficulties  Cognition Arousal/Alertness: Awake/alert Behavior During Therapy: Flat affect (although laughing and give PT high five at end of session) Overall Cognitive Status: Impaired/Different from baseline Area of Impairment: Memory;Safety/judgement;Orientation Orientation Level: Disoriented to;Situation   Memory: Decreased short-term memory   Safety/Judgement: Decreased awareness of safety;Decreased awareness of deficits     General Comments: Pt disoriented to situation and appears confused throughout session.    General Comments      Exercises        Assessment/Plan    PT Assessment Patient needs continued PT services  PT Diagnosis Difficulty walking   PT Problem List Decreased balance;Decreased safety awareness  PT Treatment Interventions DME instruction;Gait training;Stair training;Functional mobility training;Therapeutic activities;Therapeutic exercise;Balance training;Cognitive remediation;Patient/family education   PT Goals (Current goals can be found in the Care Plan section) Acute Rehab PT Goals Patient Stated Goal: none stated PT Goal Formulation: With patient Time For Goal Achievement: 06/18/16 Potential to Achieve Goals: Good    Frequency Min 3X/week   Barriers to discharge Decreased caregiver support Lives alone    Co-evaluation               End of Session Equipment Utilized During Treatment: Gait belt Activity Tolerance: Patient tolerated treatment well Patient left: in chair;with call bell/phone within reach;with chair alarm set Nurse Communication: Mobility status    Functional Assessment Tool Used: Clinical Judgement Functional Limitation: Mobility: Walking and moving around Mobility: Walking and Moving Around Current Status [phone]): At least 1 percent but less than 20 percent impaired, limited or restricted Mobility: Walking and Moving Around Goal  Status [phone]): At least 1 percent but less than 20 percent impaired, limited or restricted    Time: [id number] PT Time Calculation (min) (ACUTE ONLY): 20 min   Charges:   PT Evaluation $PT Eval Low Complexity: 1 Procedure     PT G Codes:   PT G-Codes **NOT FOR INPATIENT CLASS** Functional Assessment Tool Used: Clinical Judgement Functional Limitation: Mobility: Walking and moving around Mobility: Walking and Moving Around Current Status [id number]): At least 1 percent but less than 20 percent impaired, limited or restricted Mobility: Walking and Moving Around Goal Status [phone]): At least 1 percent but less than 20 percent impaired, limited or restricted   Collie Siad PT, DPT  Pager: [phone] Phone: [phone] 06/04/2016, 12:20 PM

## 2016-06-04 NOTE — Progress Notes (Signed)
MD Blaine Hamper notified of patient's troponin level of 0.19.  No new orders at this time.  RN will continue to monitor.  Claudette Stapler, RN

## 2016-06-05 DIAGNOSIS — F419 Anxiety disorder, unspecified: Secondary | ICD-10-CM | POA: Diagnosis not present

## 2016-06-05 DIAGNOSIS — I5033 Acute on chronic diastolic (congestive) heart failure: Secondary | ICD-10-CM | POA: Diagnosis not present

## 2016-06-05 DIAGNOSIS — Z950 Presence of cardiac pacemaker: Secondary | ICD-10-CM

## 2016-06-05 DIAGNOSIS — N184 Chronic kidney disease, stage 4 (severe): Secondary | ICD-10-CM | POA: Diagnosis not present

## 2016-06-05 DIAGNOSIS — I442 Atrioventricular block, complete: Secondary | ICD-10-CM | POA: Diagnosis not present

## 2016-06-05 LAB — URINALYSIS, ROUTINE W REFLEX MICROSCOPIC
BILIRUBIN URINE: NEGATIVE
GLUCOSE, UA: NEGATIVE mg/dL
HGB URINE DIPSTICK: NEGATIVE
KETONES UR: NEGATIVE mg/dL
Leukocytes, UA: NEGATIVE
NITRITE: NEGATIVE
PH: 6 (ref 5.0–8.0)
Protein, ur: NEGATIVE mg/dL
SPECIFIC GRAVITY, URINE: 1.014 (ref 1.005–1.030)

## 2016-06-05 LAB — BASIC METABOLIC PANEL
ANION GAP: 8 (ref 5–15)
BUN: 22 mg/dL — ABNORMAL HIGH (ref 6–20)
CHLORIDE: 101 mmol/L (ref 101–111)
CO2: 30 mmol/L (ref 22–32)
Calcium: 10.1 mg/dL (ref 8.9–10.3)
Creatinine, Ser: 1.28 mg/dL — ABNORMAL HIGH (ref 0.44–1.00)
GFR calc non Af Amer: 35 mL/min — ABNORMAL LOW (ref >60)
GFR, EST AFRICAN AMERICAN: 41 mL/min — AB (ref >60)
GLUCOSE: 104 mg/dL — AB (ref 65–99)
POTASSIUM: 3.5 mmol/L (ref 3.5–5.1)
Sodium: 139 mmol/L (ref 135–145)

## 2016-06-05 LAB — TROPONIN I: Troponin I: 0.08 ng/mL (ref <0.03)

## 2016-06-05 LAB — URINE CULTURE

## 2016-06-05 NOTE — Progress Notes (Signed)
Entered patient's room and found patient sitting on side of the bed with telebox off and battery removed. Assessed patient for orientation, patient is aware that she is at [facility] and has a physician's order for telemonitoring.  The patient refuses to wear the monitor and gives the reason "my wearing the monitor is affecting your coworker and since no one can explain how that's happening I'm not wearing it." The patient other statements that sound delusional. Explained to the patient, her refusal to be monitored can result in staff not responding in time should she have a cardiac event. Will continue to monitor, advised telemetry to put patient on standby.

## 2016-06-05 NOTE — Progress Notes (Signed)
PT Cancellation Note  Patient Details Name: Victoria Lloyd MRN: [id number] DOB: [birth year]   Cancelled Treatment:    Reason Eval/Treat Not Completed: Other (comment);Patient declined, no reason specifiedPt reports she has company present and she cannot go walking with company present.  Will f/u per POC.   Jamori Biggar Eli Hose 06/05/2016, 4:50 PM Governor Rooks, PTA pager [phone]

## 2016-06-05 NOTE — NC FL2 (Signed)
[place] MEDICAID FL2 LEVEL OF CARE SCREENING TOOL     IDENTIFICATION  Patient Name: Victoria Lloyd Birthdate: 04-04-1922 Sex: female Admission Date (Current Location): 06/03/2016  [facility] and [place] Number:  Herbalist and Address:  [facility], [place] [address] [place], [place] [address]      Provider Number: [id number]  Attending Physician Name and Address:  Cristal Ford, DO  Relative Name and Phone Number:       Current Level of Care: Hospital Recommended Level of Care: [place] Prior Approval Number:    Date Approved/Denied:   PASRR Number: [id number]  Discharge Plan: SNF    Current Diagnoses: Patient Active Problem List   Diagnosis Date Noted  . Anxiety 06/03/2016  . SOB (shortness of breath) 06/03/2016  . Elevated troponin 06/03/2016  . CHF exacerbation ([place]) 06/03/2016  . Acute on chronic diastolic (congestive) heart failure ([place])   . Syncope 04/24/2016  . Pacemaker- St Jude impalnted 08/04/14 08/06/2014  . AV block, 3rd degree (HCC) 08/03/2014  . Acute CHF- secondary to bradycardia on admision (Nl LVF by echo) 08/03/2014  . CKD (chronic kidney disease) stage 4, GFR 15-29 ml/min (HCC) 08/03/2014  . Bradycardia 08/03/2014  . Hypertension 03/15/2011    Orientation RESPIRATION BLADDER Height & Weight     Self, Time, Situation, Place  Normal Continent Weight: 133 lb 6.4 oz (60.51 kg) Height:  5\' 2"  (157.5 cm)  BEHAVIORAL SYMPTOMS/MOOD NEUROLOGICAL BOWEL NUTRITION STATUS   (none)  (none) Continent  (Heart)  AMBULATORY STATUS COMMUNICATION OF NEEDS Skin   Supervision Verbally Normal                       Personal Care Assistance Level of Assistance  Bathing, Feeding, Dressing Bathing Assistance: Limited assistance Feeding assistance: Independent Dressing Assistance: Limited assistance     Functional Limitations Info  Sight, Hearing, Speech Sight Info: Adequate Hearing Info:  Adequate Speech Info: Adequate    SPECIAL CARE FACTORS FREQUENCY                       Contractures Contractures Info: Not present    Additional Factors Info  Code Status, Allergies Code Status Info: Full Allergies Info: Codeine, Sulfa Antibiotics, Lactose Intolerance (Gi), Milk-related Compounds           Current Medications (06/05/2016):  This is the current hospital active medication list Current Facility-Administered Medications  Medication Dose Route Frequency Provider Last Rate Last Dose  . 0.9 %  sodium chloride infusion  250 mL Intravenous PRN Ivor Costa, MD      . acetaminophen (TYLENOL) tablet 325 mg  325 mg Oral Q4H PRN Ivor Costa, MD      . albuterol (PROVENTIL) (2.5 MG/3ML) 0.083% nebulizer solution 2.5 mg  2.5 mg Nebulization Q4H PRN Ivor Costa, MD      . ALPRAZolam [name]) tablet 0.5 mg  0.5 mg Oral QHS PRN Ivor Costa, MD   0.5 mg at 06/04/16 2118  . aspirin chewable tablet 81 mg  81 mg Oral Daily Ivor Costa, MD   81 mg at 06/05/16 [id number]  . calcium-vitamin D (OSCAL WITH D) 500-200 MG-UNIT per tablet 1 tablet  1 tablet Oral Daily Ivor Costa, MD   1 tablet at 06/05/16 [phone]  . carvedilol (COREG) tablet 12.5 mg  12.5 mg Oral BID WC Ivor Costa, MD   12.5 mg at 06/05/16 [id number]  . cycloSPORINE (RESTASIS) 0.05 % ophthalmic  emulsion 1 drop  1 drop Both Eyes BID Ivor Costa, MD   1 drop at 06/05/16 0839  . dextromethorphan-guaiFENesin (MUCINEX DM) 30-600 MG per 12 hr tablet 1 tablet  1 tablet Oral BID Ivor Costa, MD   1 tablet at 06/05/16 [phone]  . docusate (COLACE) 50 MG/5ML liquid 100 mg  100 mg Oral BID PRN Ivor Costa, MD      . furosemide (LASIX) injection 20 mg  20 mg Intravenous Daily Ivor Costa, MD   20 mg at 06/05/16 [id number]  . glycopyrrolate (ROBINUL) tablet 2 mg  2 mg Oral BID Ivor Costa, MD   2 mg at 06/05/16 0839  . heparin injection 5,000 Units  5,000 Units Subcutaneous Q8H Ivor Costa, MD   5,000 Units at 06/05/16 1425  . hydrALAZINE (APRESOLINE) injection 5 mg  5 mg Intravenous  Q2H PRN Ivor Costa, MD      . lidocaine (LIDODERM) 5 % 1 patch  1 patch Transdermal Q24H Ivor Costa, MD   1 patch at 06/04/16 2118  . ondansetron (ZOFRAN) injection 4 mg  4 mg Intravenous Q6H PRN Ivor Costa, MD      . senna (SENOKOT) tablet 17.2 mg  2 tablet Oral Daily Ivor Costa, MD   17.2 mg at 06/04/16 1045  . sodium chloride flush (NS) 0.9 % injection 3 mL  3 mL Intravenous Q12H Ivor Costa, MD   3 mL at 06/05/16 1000  . sodium chloride flush (NS) 0.9 % injection 3 mL  3 mL Intravenous PRN Ivor Costa, MD      . verapamil (CALAN-SR) CR tablet 180 mg  180 mg Oral QHS Ivor Costa, MD   180 mg at 06/04/16 2118     Discharge Medications: Please see discharge summary for a list of discharge medications.  Relevant Imaging Results:  Relevant Lab Results:   Additional Information SS#: [id number]  Samule Dry, LCSW

## 2016-06-05 NOTE — Progress Notes (Signed)
PROGRESS NOTE    Victoria Lloyd  [id number] DOB: November 24, 1922 DOA: 06/03/2016 PCP: [name], MD    Brief Narrative:  HPI on 06/03/2016 by Dr. Ivor Costa RAILYNN Lloyd is a 82 y.o. female with medical history significant of chronic diastolic congestive heart failure, CKD-IV, hypertension, anxiety, complete heart block, s/p of PPM, who presents with cough. Patient reports that she has been having cough for almost a month, with white mucus production. Patient has mild SOB, but no chest pain, fever or chills. She has leg edema, but no tenderness over calf areas. Patient denies symptoms of UTI (No dysuria, burning on urination or urinary frequency or urgency). No nausea, vomiting, abdominal pain, diarrhea, unilateral weakness.  Assessment & Plan   Acute on chronic diastolic (congestive) heart failure (HCC) -BNP at admission 500 -CXR showed stable elevation of right hemidiaphragm, no edema or consolidation  -Placed on IV lasix 20mg  daily -Monitor intake/output, daily weights (patient mildly incontinent)  -Weight down 4lbs since admission -Echocardiogram 04/26/2016: EF 0000000, grade 1 diastolic dysfunction -continue coreg, aspirin -No ACEI due to CKD  Elevated troponin -Likely secondary to demand ischemia and the above -Troponin peaked at 0.19 currently 0.08 -Continue aspirin, coreg  -LDL 117  Essential hypertension -continue verpamil and coreg -on IV lasix -IV hydralazine when necessary  Chronic kidney disease, stage III -Creatinine currently 1.28 -Continue to monitor BMP closely secondary to diuresis  Anxiety -Stable, continue prn Xanax  Questionable UTI/Asymptomatic bacteruria -UA: 6-30 WBC/sq epithelial, Many bacteria, large leukocytes -Urine culture showed multiple species (will recollect) -Patient received 1 dose of rocephin in the ER  Frequent falls -PT/OT consulted and recommended HH -Spoke with Son via phone, would like to transition patient to  ALF.  DVT Prophylaxis  heparin  Code Status: Full  Family Communication: None at bedside, son via phone  Disposition Plan: Admitted for observation. Suspect discharge on 7/14  Consultants None  Procedures  None  Antibiotics   Anti-infectives    Start     Dose/Rate Route Frequency Ordered Stop   06/03/16 1930  cefTRIAXone (ROCEPHIN) 1 g in dextrose 5 % 50 mL IVPB     1 g 100 mL/hr over 30 Minutes Intravenous  Once 06/03/16 1926 06/03/16 2017      Subjective:   Marlana Salvage seen and examined today.  Patient has no complaints this morning.  Denies chest pain, shortness of breath, abdominal pain, nausea, vomiting, headache, dizziness.   Objective:   Filed Vitals:   06/04/16 1322 06/04/16 1934 06/05/16 0630 06/05/16 0840  BP: 129/63 150/56 132/52 117/54  Pulse: 63 60 60 60  Temp: 98.4 F (36.9 C) 98.2 F (36.8 C) 97.6 F (36.4 C)   TempSrc: Oral Oral Oral   Resp: 20 20 20    Height:      Weight:   60.51 kg (133 lb 6.4 oz)   SpO2: 100% 97% 94%     Intake/Output Summary (Last 24 hours) at 06/05/16 1142 Last data filed at 06/05/16 0910  Gross per 24 hour  Intake    720 ml  Output    300 ml  Net    420 ml   Filed Weights   06/03/16 2218 06/04/16 0544 06/05/16 0630  Weight: 62.2 kg (137 lb 2 oz) 61.5 kg (135 lb 9.3 oz) 60.51 kg (133 lb 6.4 oz)    Exam  General: Well developed, well nourished, NAD  HEENT: NCAT, mucous membranes moist.   Cardiovascular: S1 S2 auscultated, RRR  Respiratory: Clear to auscultation  bilaterally with equal chest rise  Abdomen: Soft, nontender, nondistended, + bowel sounds  Extremities: warm dry without cyanosis clubbing or edema  Neuro: AAOx3, nonfocal  Psych: Normal affect and demeanor, pleasant   Data Reviewed: I have personally reviewed following labs and imaging studies  CBC:  Recent Labs Lab 06/03/16 1635  WBC 9.2  NEUTROABS 6.9  HGB 14.0  HCT 44.2  MCV 91.7  PLT 0000000   Basic Metabolic Panel:  Recent  Labs Lab 06/03/16 1635 06/04/16 0419 06/05/16 0337  NA 142 141 139  K 3.9 3.5 3.5  CL 103 104 101  CO2 29 26 30   GLUCOSE 125* 102* 104*  BUN 15 16 22*  CREATININE 1.08* 1.03* 1.28*  CALCIUM 10.2 10.2 10.1   GFR: Estimated Creatinine Clearance: 24 mL/min (by C-G formula based on Cr of 1.28). Liver Function Tests:  Recent Labs Lab 06/03/16 1635  AST 17  ALT 10*  ALKPHOS 55  BILITOT 0.7  PROT 7.2  ALBUMIN 3.6   No results for input(s): LIPASE, AMYLASE in the last 168 hours. No results for input(s): AMMONIA in the last 168 hours. Coagulation Profile:  Recent Labs Lab 06/03/16 2318  INR 1.40   Cardiac Enzymes:  Recent Labs Lab 06/03/16 2318 06/04/16 0419 06/04/16 0959 06/05/16 0821  TROPONINI 0.19* 0.16* 0.16* 0.08*   BNP (last 3 results) No results for input(s): PROBNP in the last 8760 hours. HbA1C:  Recent Labs  06/04/16 0419  HGBA1C 5.8*   CBG: No results for input(s): GLUCAP in the last 168 hours. Lipid Profile:  Recent Labs  06/04/16 0419  CHOL 181  HDL 43  LDLCALC 117*  TRIG 105  CHOLHDL 4.2   Thyroid Function Tests: No results for input(s): TSH, T4TOTAL, FREET4, T3FREE, THYROIDAB in the last 72 hours. Anemia Panel: No results for input(s): VITAMINB12, FOLATE, FERRITIN, TIBC, IRON, RETICCTPCT in the last 72 hours. Urine analysis:    Component Value Date/Time   COLORURINE YELLOW 06/03/2016 1730   APPEARANCEUR TURBID* 06/03/2016 1730   LABSPEC 1.023 06/03/2016 1730   PHURINE 7.0 06/03/2016 1730   GLUCOSEU NEGATIVE 06/03/2016 1730   HGBUR NEGATIVE 06/03/2016 1730   BILIRUBINUR NEGATIVE 06/03/2016 1730   KETONESUR 15* 06/03/2016 1730   PROTEINUR NEGATIVE 06/03/2016 1730   UROBILINOGEN 0.2 07/04/2009 1910   NITRITE NEGATIVE 06/03/2016 1730   LEUKOCYTESUR LARGE* 06/03/2016 1730   Sepsis Labs: @LABRCNTIP (procalcitonin:4,lacticidven:4)  ) Recent Results (from the past 240 hour(s))  Urine culture     Status: Abnormal   Collection  Time: 06/03/16  7:35 PM  Result Value Ref Range Status   Specimen Description URINE, CLEAN CATCH  Final   Special Requests NONE  Final   Culture MULTIPLE SPECIES PRESENT, SUGGEST RECOLLECTION (A)  Final   Report Status 06/05/2016 FINAL  Final      Radiology Studies: Dg Chest 2 View  06/03/2016  CLINICAL DATA:  Cough for approximately 1 month. Occasional shortness of breath EXAM: CHEST  2 VIEW COMPARISON:  Chest radiograph April 24, 2016 and chest CT April 25, 2016 FINDINGS: There is stable elevation of the right hemidiaphragm. There is no edema or consolidation. Heart is upper normal in size with pulmonary vascularity within normal limits. Pacemaker leads are attached to the right atrium and right ventricle. There are multiple calcified lymph nodes in the right hilum, azygos, and sub- carinal regions consistent with prior granulomatous disease. No adenopathy is evident. Prominence in the right peritracheal region is stable and appears to represent prominent vessels based on  the recent CT appearance. No bone lesions are evident. IMPRESSION: Stable elevation of the right hemidiaphragm. No edema or consolidation. Stable cardiac silhouette. Evidence of prior granulomatous disease with multiple calcified lymph nodes. Electronically Signed   By: Lowella Grip III M.D.   On: 06/03/2016 12:35     Scheduled Meds: . aspirin  81 mg Oral Daily  . calcium-vitamin D  1 tablet Oral Daily  . carvedilol  12.5 mg Oral BID WC  . cycloSPORINE  1 drop Both Eyes BID  . dextromethorphan-guaiFENesin  1 tablet Oral BID  . furosemide  20 mg Intravenous Daily  . glycopyrrolate  2 mg Oral BID  . heparin  5,000 Units Subcutaneous Q8H  . lidocaine  1 patch Transdermal Q24H  . senna  2 tablet Oral Daily  . sodium chloride flush  3 mL Intravenous Q12H  . verapamil  180 mg Oral QHS   Continuous Infusions:      Time Spent in minutes   30 minutes  Aryanah Enslow D.O. on 06/05/2016 at 11:42 AM  Between 7am to 7pm  - Pager - [phone]  After 7pm go to [email or web address] - password [email or web address]  And look for the night coverage person covering for me after hours  [facility] Office  [phone]

## 2016-06-06 DIAGNOSIS — N390 Urinary tract infection, site not specified: Secondary | ICD-10-CM

## 2016-06-06 DIAGNOSIS — I442 Atrioventricular block, complete: Secondary | ICD-10-CM | POA: Diagnosis not present

## 2016-06-06 DIAGNOSIS — F419 Anxiety disorder, unspecified: Secondary | ICD-10-CM | POA: Diagnosis not present

## 2016-06-06 DIAGNOSIS — I5033 Acute on chronic diastolic (congestive) heart failure: Secondary | ICD-10-CM | POA: Diagnosis not present

## 2016-06-06 LAB — MAGNESIUM: MAGNESIUM: 1.7 mg/dL (ref 1.7–2.4)

## 2016-06-06 LAB — BASIC METABOLIC PANEL
ANION GAP: 13 (ref 5–15)
BUN: 21 mg/dL — AB (ref 6–20)
CALCIUM: 10.1 mg/dL (ref 8.9–10.3)
CO2: 28 mmol/L (ref 22–32)
Chloride: 98 mmol/L — ABNORMAL LOW (ref 101–111)
Creatinine, Ser: 1.21 mg/dL — ABNORMAL HIGH (ref 0.44–1.00)
GFR calc Af Amer: 44 mL/min — ABNORMAL LOW (ref >60)
GFR, EST NON AFRICAN AMERICAN: 38 mL/min — AB (ref >60)
GLUCOSE: 87 mg/dL (ref 65–99)
POTASSIUM: 3.2 mmol/L — AB (ref 3.5–5.1)
Sodium: 139 mmol/L (ref 135–145)

## 2016-06-06 LAB — POTASSIUM: POTASSIUM: 4 mmol/L (ref 3.5–5.1)

## 2016-06-06 MED ORDER — LIDOCAINE 5 % EX PTCH: 1.0000 | MEDICATED_PATCH | CUTANEOUS | Status: DC

## 2016-06-06 MED ORDER — POTASSIUM CHLORIDE CRYS ER 20 MEQ PO TBCR
40.0000 meq | EXTENDED_RELEASE_TABLET | Freq: Once | ORAL | Status: AC
  Administered 2016-06-06: 40 meq via ORAL
  Filled 2016-06-06: qty 2

## 2016-06-06 MED ORDER — VERAPAMIL HCL ER 180 MG PO TBCR: 180.0000 mg | EXTENDED_RELEASE_TABLET | Freq: Every day | ORAL | Status: DC

## 2016-06-06 MED ORDER — DM-GUAIFENESIN ER 30-600 MG PO TB12: 1.0000 | ORAL_TABLET | Freq: Two times a day (BID) | ORAL | Status: DC

## 2016-06-06 MED ORDER — ALPRAZOLAM 0.5 MG PO TABS: 0.2500 mg | ORAL_TABLET | Freq: Every evening | ORAL | Status: DC | PRN

## 2016-06-06 MED ORDER — FUROSEMIDE 20 MG PO TABS: 20.0000 mg | ORAL_TABLET | Freq: Every day | ORAL | Status: DC

## 2016-06-06 MED ORDER — MAGNESIUM SULFATE 2 GM/50ML IV SOLN
2.0000 g | Freq: Once | INTRAVENOUS | Status: AC
  Administered 2016-06-06: 2 g via INTRAVENOUS
  Filled 2016-06-06: qty 50

## 2016-06-06 MED ORDER — VERAPAMIL HCL 120 MG PO TABS: 120.0000 mg | ORAL_TABLET | Freq: Every evening | ORAL | Status: DC

## 2016-06-06 NOTE — Care Management Note (Addendum)
Case Management Note Marvetta Gibbons RN, BSN Unit 2W-Case Manager [phone]  Patient Details  Name: OTHELL FORNASH MRN: [id number] Date of Birth: [birth year]  Subjective/Objective:     Pt admitted with acute on chronic HF               Action/Plan: PTA pt lived at home- per PT recommendations [place] orders have been placed for HHRN/PT/OT- pt's son has made arrangements for pt to d/c to [facility] ALF-CSW assisting with arrangement to [facility] has their own in house [facility] services with [facility]- spoke with Dian Situ at [facility] regarding [facility] needs for HHRN/PT/OT- will f/u with pt at ALF for [facility] needs.   Expected Discharge Date:    06/06/16             Expected Discharge Plan:  Assisted Living / Rest Home  In-House Referral:  Clinical Social Work  Discharge planning Services  CM Consult  Post Acute Care Choice:  Home Health Choice offered to:  Patient, Adult Children  DME Arranged:    DME Agency:     HH Arranged:   RN/PT/OT HH Agency:  [place]  Status of Service:  Completed, signed off  If discussed at [employer] of Stay Meetings, dates discussed:    Additional Comments:  06/06/16- 67- Marvetta Gibbons RN, CM- spoke with pt's son- who states that they have decided to return home at this time and wait on going to [place] for about a week- still would like to continue [facility] services with [facility]- and then ultimately transition pt to ALF at [facility] with [facility]. Have contacted Dian Situ at [facility] regarding the change in [place] will plan on seeing pt at her home with [facility] services that have been ordered- until pt transitions to ALF.   Dawayne Patricia, RN 06/06/2016, 12:03 PM

## 2016-06-06 NOTE — Discharge Instructions (Signed)

## 2016-06-06 NOTE — Discharge Summary (Addendum)
Physician Discharge Summary  Victoria Lloyd [id number] DOB: 1922-10-02 DOA: 06/03/2016  PCP: [name], MD  Admit date: 06/03/2016 Discharge date: 06/06/2016  Time spent: 45 minutes  Recommendations for Outpatient Follow-up:  Patient will be discharged to home with home health physical and occupational therapy.  Patient will need to follow up with primary care provider within one week of discharge, repeat BMP and cholesterol management. Patient should continue medications as prescribed.  Patient should follow a heart healthy diet.   Discharge Diagnoses:  Acute on chronic diastolic (congestive) heart failure (HCC) Elevated troponin Essential hypertension Chronic kidney disease, stage III Anxiety Questionable UTI/Asymptomatic bacteruria Frequent falls Hypokalemia/ hypomagnesemia   Discharge Condition: Stable  Diet recommendation: heart healthy  Filed Weights   06/04/16 0544 06/05/16 0630 06/06/16 0445  Weight: 61.5 kg (135 lb 9.3 oz) 60.51 kg (133 lb 6.4 oz) 60.555 kg (133 lb 8 oz)    History of present illness:  on 06/03/2016 by Dr. Angelique Lloyd is a 82 y.o. female with medical history significant of chronic diastolic congestive heart failure, CKD-IV, hypertension, anxiety, complete heart block, s/p of PPM, who presents with cough. Patient reports that she has been having cough for almost a month, with white mucus production. Patient has mild SOB, but no chest pain, fever or chills. She has leg edema, but no tenderness over calf areas. Patient denies symptoms of UTI (No dysuria, burning on urination or urinary frequency or urgency). No nausea, vomiting, abdominal pain, diarrhea, unilateral weakness.  Hospital Course:  Acute on chronic diastolic (congestive) heart failure (HCC) -BNP at admission 500 -CXR showed stable elevation of right hemidiaphragm, no edema or consolidation  -Placed on IV lasix 20mg  daily -Monitor intake/output, daily weights  (patient mildly incontinent)  -Weight down 4lbs since admission -Echocardiogram 04/26/2016: EF 0000000, grade 1 diastolic dysfunction -continue coreg, aspirin -No ACEI due to CKD  Elevated troponin -Likely secondary to demand ischemia and the above -Troponin peaked at 0.19 currently 0.08 -Continue aspirin, coreg  -LDL 117 (may consider speaking with PCP regarding cholesterol management given patient's age)  Essential hypertension -Stable, continue verpamil and coreg  Chronic kidney disease, stage III -Creatinine currently 1.21 -repeat BMP in one week  Anxiety -Stable, continue prn Xanax  Questionable UTI/Asymptomatic bacteruria -UA: 6-30 WBC/sq epithelial, Many bacteria, large leukocytes -Urine culture showed multiple species (will recollect) -Repeat UA from 06/05/16 unremarkable  -Patient received 1 dse of rocephin in the ER  Frequent falls -PT/OT consulted and recommended [place] with Son via phone, would like to transition patient to ALF.  Hypokalemia/ hypomagnesemia  -Secondary to diuresis -Replaced -Repeat BMP in one week  Consultants None  Procedures  None  Discharge Exam: Filed Vitals:   06/06/16 0857 06/06/16 1405  BP: 101/51 107/47  Pulse: 60 60  Temp:  97.8 F (36.6 C)  Resp:  19   Exam  General: Well developed, well nourished, NAD  HEENT: NCAT, mucous membranes moist.   Cardiovascular: S1 S2 auscultated, RRR  Respiratory: Clear to auscultation bilaterally with equal chest rise  Abdomen: Soft, nontender, nondistended, + bowel sounds  Extremities: warm dry without cyanosis clubbing or edema  Neuro: AAOx3, nonfocal  Psych: Normal affect and demeanor, pleasant  Discharge Instructions      Discharge Instructions    Discharge instructions    Complete by:  As directed   Patient will be discharged to home with home health physical and occupational therapy.  Patient will need to follow up with primary care provider within  one week of  discharge, repeat BMP and cholesterol management. Patient should continue medications as prescribed.  Patient should follow a heart healthy diet.            Medication List    STOP taking these medications        valsartan 160 MG tablet  Commonly known as:  DIOVAN      TAKE these medications        acetaminophen 325 MG tablet  Commonly known as:  TYLENOL  Take 1-2 tablets (325-650 mg total) by mouth every 4 (four) hours as needed for mild pain.     ALPRAZolam 0.5 MG tablet  Commonly known as:  XANAX  Take 0.5 tablets (0.25 mg total) by mouth at bedtime as needed for anxiety.     aspirin 81 MG tablet  Take 81 mg by mouth daily.     calcium-vitamin D 500-200 MG-UNIT tablet  Commonly known as:  OSCAL WITH D  Take 1 tablet by mouth daily.     carvedilol 12.5 MG tablet  Commonly known as:  COREG  Take 12.5 mg by mouth 2 (two) times daily with a meal.     cycloSPORINE 0.05 % ophthalmic emulsion  Commonly known as:  RESTASIS  1 drop 2 (two) times daily. Both eyes: 1 morning, 1 nightly, PRN for dry-eye syndrome     dextromethorphan-guaiFENesin 30-600 MG 12hr tablet  Commonly known as:  MUCINEX DM  Take 1 tablet by mouth 2 (two) times daily.     furosemide 20 MG tablet  Commonly known as:  LASIX  Take 1 tablet (20 mg total) by mouth daily.     glycopyrrolate 2 MG tablet  Commonly known as:  ROBINUL  Take 2 mg by mouth 2 (two) times daily.     lidocaine 5 %  Commonly known as:  LIDODERM  Place 1 patch onto the skin daily.     ondansetron 4 MG disintegrating tablet  Commonly known as:  ZOFRAN-ODT  Take 1 tablet (4 mg total) by mouth every 8 (eight) hours as needed for nausea or vomiting.     senna 8.6 MG Tabs tablet  Commonly known as:  SENOKOT  Take 2 tablets (17.2 mg total) by mouth daily.     verapamil 120 MG tablet  Commonly known as:  CALAN  Take 1 tablet (120 mg total) by mouth every evening.       Allergies  Allergen Reactions  . Codeine Other (See  Comments)    Unknown; patient cannot recall the reaction  . Sulfa Antibiotics Other (See Comments)    Unknown; patient cannot recall the reaction  . Lactose Intolerance (Gi) Nausea And Vomiting  . Milk-Related Compounds Nausea And Vomiting   Follow-up Information    Follow up with [name], MD. Schedule an appointment as soon as possible for a visit in 1 week.   Specialty:  Internal Medicine   Why:  Hospital follow up, repeat BMP   Contact information:   [address] [place] [employer] [place] [address] [phone]       Follow up with [facility].   Specialty:  [place]   Why:  HHRN/PT/OT arranged- in house agency will f/u at ALF  ([place])   Contact information:   [place] [phone]       The results of significant diagnostics from this hospitalization (including imaging, microbiology, ancillary and laboratory) are listed below for reference.  Significant Diagnostic Studies: Dg Chest 2 View  06/03/2016  CLINICAL DATA:  Cough for approximately 1 month. Occasional shortness of breath EXAM: CHEST  2 VIEW COMPARISON:  Chest radiograph April 24, 2016 and chest CT April 25, 2016 FINDINGS: There is stable elevation of the right hemidiaphragm. There is no edema or consolidation. Heart is upper normal in size with pulmonary vascularity within normal limits. Pacemaker leads are attached to the right atrium and right ventricle. There are multiple calcified lymph nodes in the right hilum, azygos, and sub- carinal regions consistent with prior granulomatous disease. No adenopathy is evident. Prominence in the right peritracheal region is stable and appears to represent prominent vessels based on the recent CT appearance. No bone lesions are evident. IMPRESSION: Stable elevation of the right hemidiaphragm. No edema or consolidation. Stable cardiac silhouette. Evidence of prior  granulomatous disease with multiple calcified lymph nodes. Electronically Signed   By: Lowella Grip III M.D.   On: 06/03/2016 12:35    Microbiology: Recent Results (from the past 240 hour(s))  Urine culture     Status: Abnormal   Collection Time: 06/03/16  7:35 PM  Result Value Ref Range Status   Specimen Description URINE, CLEAN CATCH  Final   Special Requests NONE  Final   Culture MULTIPLE SPECIES PRESENT, SUGGEST RECOLLECTION (A)  Final   Report Status 06/05/2016 FINAL  Final     Labs: Basic Metabolic Panel:  Recent Labs Lab 06/03/16 1635 06/04/16 0419 06/05/16 0337 06/06/16 0326 06/06/16 0700 06/06/16 1436  NA 142 141 139 139  --   --   K 3.9 3.5 3.5 3.2*  --  4.0  CL 103 104 101 98*  --   --   CO2 29 26 30 28   --   --   GLUCOSE 125* 102* 104* 87  --   --   BUN 15 16 22* 21*  --   --   CREATININE 1.08* 1.03* 1.28* 1.21*  --   --   CALCIUM 10.2 10.2 10.1 10.1  --   --   MG  --   --   --   --  1.7  --    Liver Function Tests:  Recent Labs Lab 06/03/16 1635  AST 17  ALT 10*  ALKPHOS 55  BILITOT 0.7  PROT 7.2  ALBUMIN 3.6   No results for input(s): LIPASE, AMYLASE in the last 168 hours. No results for input(s): AMMONIA in the last 168 hours. CBC:  Recent Labs Lab 06/03/16 1635  WBC 9.2  NEUTROABS 6.9  HGB 14.0  HCT 44.2  MCV 91.7  PLT 278   Cardiac Enzymes:  Recent Labs Lab 06/03/16 2318 06/04/16 0419 06/04/16 0959 06/05/16 0821  TROPONINI 0.19* 0.16* 0.16* 0.08*   BNP: BNP (last 3 results)  Recent Labs  06/03/16 1635  BNP 500.1*    ProBNP (last 3 results) No results for input(s): PROBNP in the last 8760 hours.  CBG: No results for input(s): GLUCAP in the last 168 hours.     SignedCristal Ford  [facility] 06/06/2016, 4:32 PM

## 2016-06-06 NOTE — Progress Notes (Signed)
Physical Therapy Treatment Patient Details Name: Victoria Lloyd MRN: [id number] DOB: [birth year] Today's Date: 06/06/2016    History of Present Illness 82 y.o. female admitted for productive cough for the past month. Workup suggests questionable UTI.  PMH significant for HTN, diastolic CHF, CKD stage IV, and complete heart block s/p MDT dual chamber PPM.    PT Comments    Patient progressing well towards PT goals. Pt very pleasant today and appreciative to be OOB.  Improved ambulation distance today with encouragement but fatigues. Requires assist to stand from low surfaces and demonstrates impaired safety awareness but no overt LOB. Plans to discharge home with family. Will follow acutely.   Follow Up Recommendations  Home health PT;Supervision/Assistance - 24 hour     Equipment Recommendations  None recommended by PT    Recommendations for Other Services       Precautions / Restrictions Precautions Precautions: Fall Restrictions Weight Bearing Restrictions: No    Mobility  Bed Mobility Overal bed mobility: Needs Assistance Bed Mobility: Rolling;Sidelying to Sit Rolling: Supervision Sidelying to sit: Supervision;HOB elevated       General bed mobility comments: Increased time but use of rail to get to EOB. No assist needed.  Transfers Overall transfer level: Needs assistance Equipment used: Rolling walker (2 wheeled) Transfers: Sit to/from Stand Sit to Stand: Min assist         General transfer comment: Min A to boost from lower surfaces, but min guard to stand from EOB. Cues for hand placement.   Ambulation/Gait Ambulation/Gait assistance: Min guard Ambulation Distance (Feet): 130 Feet Assistive device: Rolling walker (2 wheeled) Gait Pattern/deviations: Step-through pattern;Step-to pattern;Decreased stride length;Trunk flexed;Decreased stance time - left;Decreased step length - right Gait velocity: decreased   General Gait Details: Cues for upright  posture and RW management. Fatigues.    Stairs            Wheelchair Mobility    Modified Rankin (Stroke Patients Only)       Balance Overall balance assessment: Needs assistance Sitting-balance support: Feet supported;No upper extremity supported Sitting balance-Leahy Scale: Good Sitting balance - Comments: Able to perform pericare in sitting without assist.    Standing balance support: During functional activity Standing balance-Leahy Scale: Fair                      Cognition Arousal/Alertness: Awake/alert Behavior During Therapy: Flat affect Overall Cognitive Status: Impaired/Different from baseline Area of Impairment: Safety/judgement     Memory: Decreased short-term memory   Safety/Judgement: Decreased awareness of safety;Decreased awareness of deficits     General Comments: Very pleasantly confused this session. Able to find calendar on the wall to state date. Repeats self during session.    Exercises      General Comments        Pertinent Vitals/Pain Pain Assessment: No/denies pain    Home Living                      Prior Function            PT Goals (current goals can now be found in the care plan section) Progress towards PT goals: Progressing toward goals    Frequency  Min 3X/week    PT Plan Current plan remains appropriate    Co-evaluation             End of Session Equipment Utilized During Treatment: Gait belt Activity Tolerance: Patient tolerated treatment well Patient left: in bed;with call  bell/phone within reach;with bed alarm set     Time: [id number] PT Time Calculation (min) (ACUTE ONLY): 17 min  Charges:  $Gait Training: 8-22 mins                    G Codes:      Mando Blatz A Madesyn Ast 06/06/2016, 10:05 AM Wray Kearns, PT, DPT [phone]

## 2016-06-06 NOTE — Progress Notes (Signed)
Victoria Lloyd to be D/C'd Home per MD order. Discussed with the patient and all questions fully answered.    Medication List    STOP taking these medications        valsartan 160 MG tablet  Commonly known as:  DIOVAN      TAKE these medications        acetaminophen 325 MG tablet  Commonly known as:  TYLENOL  Take 1-2 tablets (325-650 mg total) by mouth every 4 (four) hours as needed for mild pain.     ALPRAZolam 0.5 MG tablet  Commonly known as:  XANAX  Take 0.5 tablets (0.25 mg total) by mouth at bedtime as needed for anxiety.     aspirin 81 MG tablet  Take 81 mg by mouth daily.     calcium-vitamin D 500-200 MG-UNIT tablet  Commonly known as:  OSCAL WITH D  Take 1 tablet by mouth daily.     carvedilol 12.5 MG tablet  Commonly known as:  COREG  Take 12.5 mg by mouth 2 (two) times daily with a meal.     cycloSPORINE 0.05 % ophthalmic emulsion  Commonly known as:  RESTASIS  1 drop 2 (two) times daily. Both eyes: 1 morning, 1 nightly, PRN for dry-eye syndrome     dextromethorphan-guaiFENesin 30-600 MG 12hr tablet  Commonly known as:  MUCINEX DM  Take 1 tablet by mouth 2 (two) times daily.     furosemide 20 MG tablet  Commonly known as:  LASIX  Take 1 tablet (20 mg total) by mouth daily.     glycopyrrolate 2 MG tablet  Commonly known as:  ROBINUL  Take 2 mg by mouth 2 (two) times daily.     lidocaine 5 %  Commonly known as:  LIDODERM  Place 1 patch onto the skin daily.     ondansetron 4 MG disintegrating tablet  Commonly known as:  ZOFRAN-ODT  Take 1 tablet (4 mg total) by mouth every 8 (eight) hours as needed for nausea or vomiting.     senna 8.6 MG Tabs tablet  Commonly known as:  SENOKOT  Take 2 tablets (17.2 mg total) by mouth daily.     verapamil 120 MG tablet  Commonly known as:  CALAN  Take 1 tablet (120 mg total) by mouth every evening.        VVS, Skin clean, dry and intact without evidence of skin break down, no evidence of skin tears noted.   IV catheter discontinued intact. Site without signs and symptoms of complications. Dressing and pressure applied.  An After Visit Summary was printed and given to the patient.  Patient escorted via [place], and D/C home via private auto.  Cyndra Numbers  06/06/2016 4:42 PM

## 2016-06-27 ENCOUNTER — Encounter: Payer: Self-pay | Admitting: Internal Medicine

## 2016-06-28 ENCOUNTER — Other Ambulatory Visit (HOSPITAL_COMMUNITY)
Admission: AD | Admit: 2016-06-28 | Discharge: 2016-06-28 | Disposition: A | Discharge status: Home / Self Care | Payer: Medicare Other | Source: Other Acute Inpatient Hospital | Attending: Internal Medicine | Admitting: Internal Medicine

## 2016-06-28 DIAGNOSIS — N390 Urinary tract infection, site not specified: Secondary | ICD-10-CM | POA: Diagnosis present

## 2016-06-28 LAB — URINALYSIS, ROUTINE W REFLEX MICROSCOPIC
BILIRUBIN URINE: NEGATIVE
GLUCOSE, UA: NEGATIVE mg/dL
Hgb urine dipstick: NEGATIVE
Ketones, ur: NEGATIVE mg/dL
Leukocytes, UA: NEGATIVE
Nitrite: NEGATIVE
PH: 7 (ref 5.0–8.0)
Protein, ur: NEGATIVE mg/dL
SPECIFIC GRAVITY, URINE: 1.015 (ref 1.005–1.030)

## 2016-06-30 LAB — URINE CULTURE

## 2017-05-21 ENCOUNTER — Ambulatory Visit (INDEPENDENT_AMBULATORY_CARE_PROVIDER_SITE_OTHER): Payer: Medicare Other | Admitting: Internal Medicine

## 2017-05-21 VITALS — BP 132/72 | HR 65 | Ht 62.5 in | Wt 136.4 lb

## 2017-05-21 DIAGNOSIS — Z950 Presence of cardiac pacemaker: Secondary | ICD-10-CM

## 2017-05-21 DIAGNOSIS — I442 Atrioventricular block, complete: Secondary | ICD-10-CM

## 2017-05-21 LAB — CUP PACEART INCLINIC DEVICE CHECK
Battery Impedance: 270 Ohm
Brady Statistic AP VS Percent: 0 %
Brady Statistic AS VP Percent: 34 %
Brady Statistic AS VS Percent: 0 %
Date Time Interrogation Session: 20180628162201
Implantable Lead Implant Date: 20150911
Implantable Lead Location: 753859
Lead Channel Impedance Value: 514 Ohm
Lead Channel Impedance Value: 733 Ohm
Lead Channel Pacing Threshold Amplitude: 0.375 V
Lead Channel Pacing Threshold Amplitude: 0.5 V
Lead Channel Pacing Threshold Amplitude: 0.625 V
Lead Channel Pacing Threshold Pulse Width: 0.4 ms
Lead Channel Pacing Threshold Pulse Width: 0.4 ms
Lead Channel Sensing Intrinsic Amplitude: 4 mV
Lead Channel Setting Pacing Amplitude: 2 V
Lead Channel Setting Pacing Amplitude: 2.5 V
Lead Channel Setting Pacing Pulse Width: 0.4 ms
Lead Channel Setting Sensing Sensitivity: 4 mV
MDC IDC LEAD IMPLANT DT: 20150911
MDC IDC LEAD LOCATION: 753860
MDC IDC MSMT BATTERY REMAINING LONGEVITY: 95 mo
MDC IDC MSMT BATTERY VOLTAGE: 2.79 V
MDC IDC MSMT LEADCHNL RA PACING THRESHOLD AMPLITUDE: 0.5 V
MDC IDC MSMT LEADCHNL RA PACING THRESHOLD PULSEWIDTH: 0.4 ms
MDC IDC MSMT LEADCHNL RA PACING THRESHOLD PULSEWIDTH: 0.4 ms
MDC IDC PG IMPLANT DT: 20150911
MDC IDC STAT BRADY AP VP PERCENT: 66 %

## 2017-05-21 NOTE — Patient Instructions (Addendum)
Medication Instructions:    Your physician recommends that you continue on your current medications as directed. Please refer to the Current Medication list given to you today.  --- If you need a refill on your cardiac medications before your next appointment, please call your pharmacy. ---  Labwork:  None ordered  Testing/Procedures:  None ordered  Follow-Up: Your physician wants you to follow-up in: 6 months with device clinic. You will receive a reminder letter in the mail two months in advance. If you don't receive a letter, please call our office to schedule the follow-up appointment.   Your physician wants you to follow-up in: 1 year with Chanetta Marshall, NP.  You will receive a reminder letter in the mail two months in advance. If you don't receive a letter, please call our office to schedule the follow-up appointment.  Thank you for choosing [facility]!!

## 2017-05-21 NOTE — Progress Notes (Signed)
Patient Care Team: [name], MD as PCP - General (Internal Medicine)   HPI  Victoria Lloyd is a 83 y.o. female Seen in follow-up for a pacemaker implanted 9/15 for 2-1 heart block.  She has a history of hypertension.  She denies chest pain or shortness of breath or edema.. Her memory is pretty good.  Past Medical History:  Diagnosis Date  . Chronic diastolic (congestive) heart failure ([place])   . CKD (chronic kidney disease), stage IV ([place])   . Complete heart block (HCC)    a. s/p MDT dual chamber PPM   . Hypertension     Past Surgical History:  Procedure Laterality Date  . PERMANENT PACEMAKER INSERTION N/A 08/04/2014   Procedure: PERMANENT PACEMAKER INSERTION;  Surgeon: Deboraha Sprang, MD;  Location: [facility] CATH LAB;  Service: Cardiovascular;  Laterality: N/A;    Current Outpatient Prescriptions  Medication Sig Dispense Refill  . acetaminophen (TYLENOL) 325 MG tablet Take 1-2 tablets (325-650 mg total) by mouth every 4 (four) hours as needed for mild pain.    Marland Kitchen ALPRAZolam (XANAX) 0.5 MG tablet Take 0.5 tablets (0.25 mg total) by mouth at bedtime as needed for anxiety. 20 tablet 0  . aspirin 81 MG tablet Take 81 mg by mouth daily.      . calcium-vitamin D (OSCAL WITH D) 500-200 MG-UNIT per tablet Take 1 tablet by mouth daily.      . carvedilol (COREG) 12.5 MG tablet Take 12.5 mg by mouth 2 (two) times daily with a meal.    . cycloSPORINE (RESTASIS) 0.05 % ophthalmic emulsion 1 drop 2 (two) times daily. Both eyes: 1 morning, 1 nightly, PRN for dry-eye syndrome    . dextromethorphan-guaiFENesin (MUCINEX DM) 30-600 MG 12hr tablet Take 1 tablet by mouth 2 (two) times daily. 14 tablet 0  . furosemide (LASIX) 20 MG tablet Take 1 tablet (20 mg total) by mouth daily. 30 tablet 0  . glycopyrrolate (ROBINUL) 2 MG tablet Take 2 mg by mouth 2 (two) times daily.    Marland Kitchen lidocaine (LIDODERM) 5 % Place 1 patch onto the skin daily. 30 patch 0  . ondansetron (ZOFRAN-ODT) 4 MG  disintegrating tablet Take 1 tablet (4 mg total) by mouth every 8 (eight) hours as needed for nausea or vomiting. 20 tablet 0  . senna (SENOKOT) 8.6 MG TABS tablet Take 2 tablets (17.2 mg total) by mouth daily. 120 each 0  . verapamil (CALAN) 120 MG tablet Take 1 tablet (120 mg total) by mouth every evening. 30 tablet 0   No current facility-administered medications for this visit.     Allergies  Allergen Reactions  . Codeine Other (See Comments)    Unknown; patient cannot recall the reaction  . Sulfa Antibiotics Other (See Comments)    Unknown; patient cannot recall the reaction  . Lactose Intolerance (Gi) Nausea And Vomiting  . Milk-Related Compounds Nausea And Vomiting    Review of Systems negative except from HPI and PMH  Physical Exam BP 132/72   Pulse 65   Ht 5' 2.5" (1.588 m)   Wt 136 lb 6 oz (61.9 kg)   SpO2 98%   BMI 24.55 kg/m  Well developed and nourished in no acute distress HENT normal Neck supple   Device pocket well healed; without hematoma or erythema.  There is no tethering  Clear Regular rate and rhythm, no murmurs or gallops Abd-soft with active BS No Clubbing cyanosis edema Skin-warm and dry A & Oriented  Grossly normal sensory and motor function   ECG demonstrates sinus rhythm at 65 with P synchronous pacing  Assessment and  Plan  complete heart block  Pacemaker-[employer]  Patient is stable. Device function is normal.

## 2017-12-10 ENCOUNTER — Encounter: Payer: Self-pay | Admitting: Internal Medicine

## 2018-02-01 ENCOUNTER — Emergency Department (HOSPITAL_COMMUNITY): Payer: Medicare Other

## 2018-02-01 ENCOUNTER — Observation Stay (HOSPITAL_COMMUNITY)
Admission: EM | Admit: 2018-02-01 | Discharge: 2018-02-03 | Disposition: A | Discharge status: Skilled Nursing Facility | Payer: Medicare Other | Attending: Internal Medicine | Admitting: Internal Medicine

## 2018-02-01 ENCOUNTER — Encounter (HOSPITAL_COMMUNITY): Payer: Self-pay | Admitting: *Deleted

## 2018-02-01 ENCOUNTER — Other Ambulatory Visit: Payer: Self-pay

## 2018-02-01 DIAGNOSIS — Z882 Allergy status to sulfonamides status: Secondary | ICD-10-CM | POA: Diagnosis not present

## 2018-02-01 DIAGNOSIS — Z87440 Personal history of urinary (tract) infections: Secondary | ICD-10-CM | POA: Diagnosis not present

## 2018-02-01 DIAGNOSIS — I10 Essential (primary) hypertension: Secondary | ICD-10-CM | POA: Diagnosis not present

## 2018-02-01 DIAGNOSIS — M25511 Pain in right shoulder: Principal | ICD-10-CM | POA: Insufficient documentation

## 2018-02-01 DIAGNOSIS — E739 Lactose intolerance, unspecified: Secondary | ICD-10-CM | POA: Diagnosis not present

## 2018-02-01 DIAGNOSIS — E876 Hypokalemia: Secondary | ICD-10-CM | POA: Insufficient documentation

## 2018-02-01 DIAGNOSIS — G8929 Other chronic pain: Secondary | ICD-10-CM | POA: Insufficient documentation

## 2018-02-01 DIAGNOSIS — R262 Difficulty in walking, not elsewhere classified: Secondary | ICD-10-CM | POA: Insufficient documentation

## 2018-02-01 DIAGNOSIS — E785 Hyperlipidemia, unspecified: Secondary | ICD-10-CM | POA: Insufficient documentation

## 2018-02-01 DIAGNOSIS — Z950 Presence of cardiac pacemaker: Secondary | ICD-10-CM | POA: Insufficient documentation

## 2018-02-01 DIAGNOSIS — F419 Anxiety disorder, unspecified: Secondary | ICD-10-CM | POA: Diagnosis not present

## 2018-02-01 DIAGNOSIS — M6281 Muscle weakness (generalized): Secondary | ICD-10-CM | POA: Diagnosis not present

## 2018-02-01 DIAGNOSIS — W19XXXA Unspecified fall, initial encounter: Secondary | ICD-10-CM | POA: Insufficient documentation

## 2018-02-01 DIAGNOSIS — Z885 Allergy status to narcotic agent status: Secondary | ICD-10-CM | POA: Insufficient documentation

## 2018-02-01 DIAGNOSIS — R509 Fever, unspecified: Secondary | ICD-10-CM | POA: Diagnosis present

## 2018-02-01 DIAGNOSIS — G9389 Other specified disorders of brain: Secondary | ICD-10-CM | POA: Insufficient documentation

## 2018-02-01 DIAGNOSIS — Z79899 Other long term (current) drug therapy: Secondary | ICD-10-CM | POA: Insufficient documentation

## 2018-02-01 DIAGNOSIS — I130 Hypertensive heart and chronic kidney disease with heart failure and stage 1 through stage 4 chronic kidney disease, or unspecified chronic kidney disease: Secondary | ICD-10-CM | POA: Insufficient documentation

## 2018-02-01 DIAGNOSIS — M5136 Other intervertebral disc degeneration, lumbar region: Secondary | ICD-10-CM | POA: Insufficient documentation

## 2018-02-01 DIAGNOSIS — I639 Cerebral infarction, unspecified: Secondary | ICD-10-CM

## 2018-02-01 DIAGNOSIS — N184 Chronic kidney disease, stage 4 (severe): Secondary | ICD-10-CM | POA: Diagnosis not present

## 2018-02-01 DIAGNOSIS — Z9181 History of falling: Secondary | ICD-10-CM | POA: Insufficient documentation

## 2018-02-01 DIAGNOSIS — I5032 Chronic diastolic (congestive) heart failure: Secondary | ICD-10-CM | POA: Insufficient documentation

## 2018-02-01 DIAGNOSIS — Y92009 Unspecified place in unspecified non-institutional (private) residence as the place of occurrence of the external cause: Secondary | ICD-10-CM

## 2018-02-01 DIAGNOSIS — Z7982 Long term (current) use of aspirin: Secondary | ICD-10-CM | POA: Diagnosis not present

## 2018-02-01 DIAGNOSIS — Z8673 Personal history of transient ischemic attack (TIA), and cerebral infarction without residual deficits: Secondary | ICD-10-CM | POA: Insufficient documentation

## 2018-02-01 HISTORY — DX: Cerebral infarction, unspecified: I63.9

## 2018-02-01 LAB — COMPREHENSIVE METABOLIC PANEL
ALBUMIN: 3.6 g/dL (ref 3.5–5.0)
ALT: 11 U/L — ABNORMAL LOW (ref 14–54)
ANION GAP: 12 (ref 5–15)
AST: 21 U/L (ref 15–41)
Alkaline Phosphatase: 55 U/L (ref 38–126)
BILIRUBIN TOTAL: 0.8 mg/dL (ref 0.3–1.2)
BUN: 20 mg/dL (ref 6–20)
CO2: 31 mmol/L (ref 22–32)
Calcium: 9.8 mg/dL (ref 8.9–10.3)
Chloride: 98 mmol/L — ABNORMAL LOW (ref 101–111)
Creatinine, Ser: 1.17 mg/dL — ABNORMAL HIGH (ref 0.44–1.00)
GFR calc Af Amer: 45 mL/min — ABNORMAL LOW (ref >60)
GFR calc non Af Amer: 39 mL/min — ABNORMAL LOW (ref >60)
GLUCOSE: 151 mg/dL — AB (ref 65–99)
POTASSIUM: 3.6 mmol/L (ref 3.5–5.1)
SODIUM: 141 mmol/L (ref 135–145)
Total Protein: 7.9 g/dL (ref 6.5–8.1)

## 2018-02-01 LAB — CBC WITH DIFFERENTIAL/PLATELET
BASOS PCT: 0 %
Basophils Absolute: 0 10*3/uL (ref 0.0–0.1)
EOS ABS: 0 10*3/uL (ref 0.0–0.7)
Eosinophils Relative: 0 %
HEMATOCRIT: 44.2 % (ref 36.0–46.0)
HEMOGLOBIN: 14.5 g/dL (ref 12.0–15.0)
Lymphocytes Relative: 14 %
Lymphs Abs: 1.2 10*3/uL (ref 0.7–4.0)
MCH: 30.1 pg (ref 26.0–34.0)
MCHC: 32.8 g/dL (ref 30.0–36.0)
MCV: 91.7 fL (ref 78.0–100.0)
Monocytes Absolute: 1.1 10*3/uL — ABNORMAL HIGH (ref 0.1–1.0)
Monocytes Relative: 12 %
NEUTROS ABS: 6.2 10*3/uL (ref 1.7–7.7)
NEUTROS PCT: 74 %
Platelets: 297 10*3/uL (ref 150–400)
RBC: 4.82 MIL/uL (ref 3.87–5.11)
RDW: 14.1 % (ref 11.5–15.5)
WBC: 8.5 10*3/uL (ref 4.0–10.5)

## 2018-02-01 LAB — INFLUENZA PANEL BY PCR (TYPE A & B)
INFLBPCR: NEGATIVE
Influenza A By PCR: NEGATIVE

## 2018-02-01 LAB — I-STAT CG4 LACTIC ACID, ED: Lactic Acid, Venous: 1.59 mmol/L (ref 0.5–1.9)

## 2018-02-01 LAB — ACETAMINOPHEN LEVEL: Acetaminophen (Tylenol), Serum: <10 ug/mL — ABNORMAL LOW (ref 10–30)

## 2018-02-01 LAB — URINALYSIS, ROUTINE W REFLEX MICROSCOPIC
BILIRUBIN URINE: NEGATIVE
Glucose, UA: NEGATIVE mg/dL
Hgb urine dipstick: NEGATIVE
KETONES UR: NEGATIVE mg/dL
LEUKOCYTES UA: NEGATIVE
NITRITE: NEGATIVE
PH: 7 (ref 5.0–8.0)
Protein, ur: NEGATIVE mg/dL
SPECIFIC GRAVITY, URINE: 1.01 (ref 1.005–1.030)

## 2018-02-01 LAB — AMMONIA: AMMONIA: 22 umol/L (ref 9–35)

## 2018-02-01 LAB — SALICYLATE LEVEL: Salicylate Lvl: <7 mg/dL (ref 2.8–30.0)

## 2018-02-01 LAB — SEDIMENTATION RATE: Sed Rate: 68 mm/hr — ABNORMAL HIGH (ref 0–22)

## 2018-02-01 MED ORDER — SODIUM CHLORIDE 0.9% FLUSH: 3.0000 mL | INTRAVENOUS | Status: DC | PRN

## 2018-02-01 MED ORDER — CYCLOSPORINE 0.05 % OP EMUL
1.0000 [drp] | Freq: Two times a day (BID) | OPHTHALMIC | Status: DC
  Administered 2018-02-01 – 2018-02-03 (×4): 1 [drp] via OPHTHALMIC
  Filled 2018-02-01 (×5): qty 1

## 2018-02-01 MED ORDER — VERAPAMIL HCL 120 MG PO TABS
120.0000 mg | ORAL_TABLET | Freq: Every evening | ORAL | Status: DC
  Administered 2018-02-01 – 2018-02-03 (×3): 120 mg via ORAL
  Filled 2018-02-01 (×3): qty 1

## 2018-02-01 MED ORDER — OSELTAMIVIR PHOSPHATE 75 MG PO CAPS
75.0000 mg | ORAL_CAPSULE | Freq: Once | ORAL | Status: DC
  Filled 2018-02-01: qty 1

## 2018-02-01 MED ORDER — ACETAMINOPHEN 650 MG RE SUPP: 650.0000 mg | Freq: Four times a day (QID) | RECTAL | Status: DC | PRN

## 2018-02-01 MED ORDER — ASPIRIN EC 81 MG PO TBEC
81.0000 mg | DELAYED_RELEASE_TABLET | Freq: Every day | ORAL | Status: DC
  Administered 2018-02-02 – 2018-02-03 (×2): 81 mg via ORAL
  Filled 2018-02-01 (×2): qty 1

## 2018-02-01 MED ORDER — OSELTAMIVIR PHOSPHATE 30 MG PO CAPS
30.0000 mg | ORAL_CAPSULE | Freq: Once | ORAL | Status: AC
  Administered 2018-02-01: 30 mg via ORAL
  Filled 2018-02-01: qty 1

## 2018-02-01 MED ORDER — SODIUM CHLORIDE 0.9% FLUSH
3.0000 mL | Freq: Two times a day (BID) | INTRAVENOUS | Status: DC
  Administered 2018-02-01 – 2018-02-03 (×3): 3 mL via INTRAVENOUS

## 2018-02-01 MED ORDER — ENOXAPARIN SODIUM 30 MG/0.3ML ~~LOC~~ SOLN
30.0000 mg | SUBCUTANEOUS | Status: DC
  Administered 2018-02-01 – 2018-02-02 (×2): 30 mg via SUBCUTANEOUS
  Filled 2018-02-01 (×2): qty 0.3

## 2018-02-01 MED ORDER — DICYCLOMINE HCL 10 MG PO CAPS
10.0000 mg | ORAL_CAPSULE | Freq: Every day | ORAL | Status: DC
  Administered 2018-02-01 – 2018-02-02 (×2): 10 mg via ORAL
  Filled 2018-02-01 (×2): qty 1

## 2018-02-01 MED ORDER — CARVEDILOL 12.5 MG PO TABS
12.5000 mg | ORAL_TABLET | Freq: Two times a day (BID) | ORAL | Status: DC
  Administered 2018-02-02 – 2018-02-03 (×4): 12.5 mg via ORAL
  Filled 2018-02-01 (×4): qty 1

## 2018-02-01 MED ORDER — ALPRAZOLAM 0.25 MG PO TABS
0.1250 mg | ORAL_TABLET | Freq: Every day | ORAL | Status: DC
  Administered 2018-02-01 – 2018-02-02 (×2): 0.125 mg via ORAL
  Filled 2018-02-01 (×2): qty 1

## 2018-02-01 MED ORDER — TRAMADOL HCL 50 MG PO TABS: 50.0000 mg | ORAL_TABLET | Freq: Two times a day (BID) | ORAL | Status: DC | PRN

## 2018-02-01 MED ORDER — ALPRAZOLAM 0.25 MG PO TABS: 0.2500 mg | ORAL_TABLET | Freq: Every evening | ORAL | Status: DC | PRN

## 2018-02-01 MED ORDER — SODIUM CHLORIDE 0.9 % IV SOLN: 250.0000 mL | INTRAVENOUS | Status: DC | PRN

## 2018-02-01 MED ORDER — ACETAMINOPHEN 500 MG PO TABS: 1000.0000 mg | ORAL_TABLET | Freq: Once | ORAL | Status: DC

## 2018-02-01 MED ORDER — ACETAMINOPHEN 325 MG PO TABS
650.0000 mg | ORAL_TABLET | Freq: Four times a day (QID) | ORAL | Status: DC | PRN
  Administered 2018-02-02 – 2018-02-03 (×2): 650 mg via ORAL
  Filled 2018-02-01 (×2): qty 2

## 2018-02-01 MED ORDER — FUROSEMIDE 20 MG PO TABS
20.0000 mg | ORAL_TABLET | Freq: Every day | ORAL | Status: DC
  Administered 2018-02-02 – 2018-02-03 (×2): 20 mg via ORAL
  Filled 2018-02-01 (×2): qty 1

## 2018-02-01 NOTE — ED Notes (Signed)
Rn attempted to stick for IV access x2. Another RN looked a potential site for IV access with no success.

## 2018-02-01 NOTE — ED Notes (Signed)
EKG attempted 4th time. Patient not in room. Family member states they were transported to XR.

## 2018-02-01 NOTE — ED Notes (Signed)
Unsuccessful with second set of blood culture

## 2018-02-01 NOTE — H&P (Signed)
TRH H&P   Patient Demographics:    Victoria Lloyd, is a 84 y.o. female  MRN: [id number]   DOB - 10/23/1922  Admit Date - 02/01/2018  Outpatient Primary MD for the patient is Patient, No Pcp Per Jeanann Lewandowsky   Referring MD/NP/PA: Carmon Sails  Outpatient Specialists:  Virl Axe   Patient coming from: home  Chief Complaint  Patient presents with  . Shoulder Pain    right      HPI:    Victoria Lloyd  is a 84 y.o. female, w hypertension, CKD stage 4, CHF, complete HB, s/p pacer, apparently c/o fall today,  Mechanical.  Pt was walking thru the house when she fell. Landed on carpet.  Pt was brought to ED by her caretaker/EMS  In Ed,  CT brain  IMPRESSION: 1. Chronic stable cerebral atrophy with small vessel ischemia. 2. Chronic right PCA distribution infarct with right occipital lobe encephalomalacia.  CXR IMPRESSION: No acute cardiopulmonary disease. Stable mild cardiomegaly without pulmonary edema. Stable moderate elevation of the right Hemidiaphragm.  Xray T spine IMPRESSION: Mild degenerative change without acute abnormality.  Xray L spine IMPRESSION: Stable grade 2 anterolisthesis of L4-5 is noted secondary to posterior facet joint hypertrophy. Mild multilevel degenerative disc disease is noted. No acute abnormality is noted.  Xray R shoulder  IMPRESSION: 1. AC joint osteoarthritis. 2. No acute findings.  Wbc 8.5, Hgb 14.5, Plt 297'  Urinalysis negative Blood culture x2 pending  Na 141, K 3.6, Bun 20, Creatinine 1.17, Ast 21, Alt 11 Alk phos 55, T bili 0.8 Glucose 151  Tylenol <10 Influenza negative  Pt will be admitted over nite for fever unclear source and pain management.      Review of systems:    In addition to the HPI above,    No Fever-chills, No Headache, No changes with Vision or hearing, No problems swallowing food  or Liquids, No Chest pain, Cough or Shortness of Breath, No Abdominal pain, No Nausea or Vommitting, Bowel movements are regular, No Blood in stool or Urine, No dysuria, No new skin rashes or bruises, No new joints pains-aches,  No new weakness, tingling, numbness in any extremity, No recent weight gain or loss, No polyuria, polydypsia or polyphagia, No significant Mental Stressors.  A full 10 point Review of Systems was done, except as stated above, all other Review of Systems were negative.   With Past History of the following :    Past Medical History:  Diagnosis Date  . Chronic diastolic (congestive) heart failure ([place])   . CKD (chronic kidney disease), stage IV ([place])   . Complete heart block (HCC)    a. s/p MDT dual chamber PPM   . Hypertension       Past Surgical History:  Procedure Laterality Date  . CATARACT EXTRACTION    . PERMANENT PACEMAKER INSERTION N/A 08/04/2014   Procedure: PERMANENT PACEMAKER  INSERTION;  Surgeon: Deboraha Sprang, MD;  Location: [facility] CATH LAB;  Service: Cardiovascular;  Laterality: N/A;      Social History:     Social History   Tobacco Use  . Smoking status: Never Smoker  . Smokeless tobacco: Never Used  Substance Use Topics  . Alcohol use: No     Lives - at home  Mobility - walks by self   Family History :     Family History  Problem Relation Age of Onset  . Hypertension Mother   . Heart attack Father       Home Medications:   Prior to Admission medications   Medication Sig Start Date End Date Taking? Authorizing Provider  ALPRAZolam (XANAX) 0.25 MG tablet Take 0.125 mg by mouth at bedtime.  11/28/17  Yes [provider]  aspirin 81 MG tablet Take 81 mg by mouth daily.     Yes [provider]  calcium-vitamin D (OSCAL WITH D) 500-200 MG-UNIT per tablet Take 1 tablet by mouth daily.     Yes [provider]  carvedilol (COREG) 12.5 MG tablet Take 12.5 mg by mouth 2 (two) times daily with a meal.    Yes [provider]  dicyclomine (BENTYL) 10 MG capsule Take 10 mg by mouth at bedtime.  11/27/17  Yes [provider]  furosemide (LASIX) 20 MG tablet Take 1 tablet (20 mg total) by mouth daily. 06/06/16  Yes Mikhail, Velta Addison, DO  verapamil (CALAN-SR) 180 MG CR tablet TK 1 T PO QD 01/30/18  Yes [provider]  acetaminophen (TYLENOL) 325 MG tablet Take 1-2 tablets (325-650 mg total) by mouth every 4 (four) hours as needed for mild pain. Patient not taking: Reported on 02/01/2018 08/06/14   Erlene Quan, PA-C  ALPRAZolam [name]) 0.5 MG tablet Take 0.5 tablets (0.25 mg total) by mouth at bedtime as needed for anxiety. Patient not taking: Reported on 02/01/2018 06/06/16   Cristal Ford, DO  cycloSPORINE (RESTASIS) 0.05 % ophthalmic emulsion 1 drop 2 (two) times daily. Both eyes: 1 morning, 1 nightly, PRN for dry-eye syndrome    [provider]  dextromethorphan-guaiFENesin (MUCINEX DM) 30-600 MG 12hr tablet Take 1 tablet by mouth 2 (two) times daily. Patient not taking: Reported on 02/01/2018 06/06/16   Cristal Ford, DO  lidocaine (LIDODERM) 5 % Place 1 patch onto the skin daily. Patient not taking: Reported on 02/01/2018 06/06/16   Cristal Ford, DO  ondansetron (ZOFRAN-ODT) 4 MG disintegrating tablet Take 1 tablet (4 mg total) by mouth every 8 (eight) hours as needed for nausea or vomiting. Patient not taking: Reported on 02/01/2018 04/29/16   Tomma Rakers, MD  senna (SENOKOT) 8.6 MG TABS tablet Take 2 tablets (17.2 mg total) by mouth daily. Patient not taking: Reported on 02/01/2018 04/29/16   Tomma Rakers, MD  verapamil (CALAN) 120 MG tablet Take 1 tablet (120 mg total) by mouth every evening. Patient not taking: Reported on 02/01/2018 06/06/16   Cristal Ford, DO     Allergies:     Allergies  Allergen Reactions  . Codeine Other (See Comments)    Unknown; patient cannot recall the reaction  . Sulfa Antibiotics Other (See Comments)      Unknown; patient cannot recall the reaction  . Lactose Intolerance (Gi) Nausea And Vomiting  . Milk-Related Compounds Nausea And Vomiting     Physical Exam:   Vitals  Blood pressure (!) 142/89, pulse 75, temperature (!) 100.6 F (38.1 C), temperature source Rectal,  resp. rate 18, SpO2 95 %.   1. General  lying in bed in NAD,    2. Normal affect and insight, Not Suicidal or Homicidal, Awake Alert, Oriented X 3.  3. No F.N deficits, ALL C.Nerves Intact, Strength 5/5 all 4 extremities, Sensation intact all 4 extremities, Plantars down going.  4. Ears and Eyes appear Normal, Conjunctivae clear, PERRLA. Moist Oral Mucosa.  5. Supple Neck, No JVD, No cervical lymphadenopathy appriciated, No Carotid Bruits.  6. Symmetrical Chest wall movement, Good air movement bilaterally, CTAB.  7. RRR, No Gallops, Rubs or Murmurs, No Parasternal Heave.  8. Positive Bowel Sounds, Abdomen Soft, No tenderness, No organomegaly appriciated,No rebound -guarding or rigidity.  9.  No Cyanosis, Normal Skin Turgor, No Skin Rash or Bruise.  10. Good muscle tone,  joints appear normal , no effusions, Normal ROM.  11. No Palpable Lymph Nodes in Neck or Axillae  No janeway, no osler, no splinter   Data Review:    CBC Recent Labs  Lab 02/01/18 1327  WBC 8.5  HGB 14.5  HCT 44.2  PLT 297  MCV 91.7  MCH 30.1  MCHC 32.8  RDW 14.1  LYMPHSABS 1.2  MONOABS 1.1*  EOSABS 0.0  BASOSABS 0.0   ------------------------------------------------------------------------------------------------------------------  Chemistries  Recent Labs  Lab 02/01/18 1500  NA 141  K 3.6  CL 98*  CO2 31  GLUCOSE 151*  BUN 20  CREATININE 1.17*  CALCIUM 9.8  AST 21  ALT 11*  ALKPHOS 55  BILITOT 0.8   ------------------------------------------------------------------------------------------------------------------ CrCl cannot be calculated (Unknown ideal  weight.). ------------------------------------------------------------------------------------------------------------------ No results for input(s): TSH, T4TOTAL, T3FREE, THYROIDAB in the last 72 hours.  Invalid input(s): FREET3  Coagulation profile No results for input(s): INR, PROTIME in the last 168 hours. ------------------------------------------------------------------------------------------------------------------- No results for input(s): DDIMER in the last 72 hours. -------------------------------------------------------------------------------------------------------------------  Cardiac Enzymes No results for input(s): CKMB, TROPONINI, MYOGLOBIN in the last 168 hours.  Invalid input(s): CK ------------------------------------------------------------------------------------------------------------------    Component Value Date/Time   BNP 500.1 (H) 06/03/2016 1635     ---------------------------------------------------------------------------------------------------------------  Urinalysis    Component Value Date/Time   COLORURINE YELLOW 02/01/2018 1440   APPEARANCEUR CLEAR 02/01/2018 1440   LABSPEC 1.010 02/01/2018 1440   PHURINE 7.0 02/01/2018 1440   GLUCOSEU NEGATIVE 02/01/2018 1440   HGBUR NEGATIVE 02/01/2018 1440   BILIRUBINUR NEGATIVE 02/01/2018 1440   KETONESUR NEGATIVE 02/01/2018 1440   PROTEINUR NEGATIVE 02/01/2018 1440   UROBILINOGEN 0.2 07/04/2009 1910   NITRITE NEGATIVE 02/01/2018 1440   LEUKOCYTESUR NEGATIVE 02/01/2018 1440    ----------------------------------------------------------------------------------------------------------------   Imaging Results:    Dg Chest 2 View  Result Date: 02/01/2018 CLINICAL DATA:  [place] change.  Recent fall. EXAM: CHEST - 2 VIEW COMPARISON:  06/03/2016 chest radiograph. FINDINGS: Stable configuration of 2 lead left subclavian pacemaker. Stable cardiomediastinal silhouette with mild cardiomegaly. No  pneumothorax. No pleural effusion. Stable moderate elevation of the right hemidiaphragm. No pulmonary edema. No acute consolidative airspace disease. IMPRESSION: No acute cardiopulmonary disease. Stable mild cardiomegaly without pulmonary edema. Stable moderate elevation of the right hemidiaphragm. Electronically Signed   By: Ilona Sorrel M.D.   On: 02/01/2018 15:52   Dg Thoracic Spine 2 View  Result Date: 02/01/2018 CLINICAL DATA:  Fall several weeks ago with back pain, initial encounter EXAM: THORACIC SPINE 2 VIEWS COMPARISON:  04/25/2016 FINDINGS: Vertebral body height is well maintained. Mild osteophytic changes are seen. No paraspinal mass lesion is noted. No definitive rib abnormality is noted. Pacing device is again seen. IMPRESSION: Mild  degenerative change without acute abnormality. Electronically Signed   By: Inez Catalina M.D.   On: 02/01/2018 15:55   Dg Lumbar Spine Complete  Result Date: 02/01/2018 CLINICAL DATA:  Chronic back pain. EXAM: LUMBAR SPINE - COMPLETE 4+ VIEW COMPARISON:  Radiographs of Apr 03, 2009. FINDINGS: Stable grade 2 anterolisthesis of L4-5 is noted secondary to posterior facet joint hypertrophy. Mild degenerative disc disease is noted at L2-3 and L4-5. No acute fracture is noted. Atherosclerosis of abdominal aorta is noted. IMPRESSION: Stable grade 2 anterolisthesis of L4-5 is noted secondary to posterior facet joint hypertrophy. Mild multilevel degenerative disc disease is noted. No acute abnormality is noted. Aortic Atherosclerosis (ICD10-I70.0). Electronically Signed   By: Marijo Conception, M.D.   On: 02/01/2018 15:54   Dg Pelvis 1-2 Views  Result Date: 02/01/2018 CLINICAL DATA:  Behavior change.  Recent fall. EXAM: PELVIS - 1-2 VIEW COMPARISON:  01/31/2016 sacrococcygeal radiographs FINDINGS: No acute pelvic fracture. No pelvic diastasis. Healed deformities in the left pubic rami are unchanged since 2017. No suspicious focal osseous lesions. No evidence of hip  dislocation on this single frontal view. Degenerative changes in the visualized lower lumbar spine. IMPRESSION: No acute pelvic fracture. Chronic healed left pubic rami deformities. Electronically Signed   By: Ilona Sorrel M.D.   On: 02/01/2018 15:54   Dg Shoulder Right  Result Date: 02/01/2018 CLINICAL DATA:  Shoulder pain. EXAM: RIGHT SHOULDER - 2+ VIEW COMPARISON:  08/04/2016 FINDINGS: There is no acute fracture or subluxation identified. No radio-opaque foreign body or soft tissue calcification. Moderate degenerative changes are noted at the acromioclavicular joint. Large calcified mediastinal and hilar lymph nodes identified. IMPRESSION: 1. AC joint osteoarthritis. 2. No acute findings. Electronically Signed   By: Kerby Moors M.D.   On: 02/01/2018 15:54   Ct Head Wo Contrast  Result Date: 02/01/2018 CLINICAL DATA:  Chronic pain.  Patient fell 3 weeks ago. EXAM: CT HEAD WITHOUT CONTRAST TECHNIQUE: Contiguous axial images were obtained from the base of the skull through the vertex without intravenous contrast. COMPARISON:  09/06/2011 FINDINGS: Brain: Encephalomalacia in the right occipital lobe. Chronic cerebral atrophy with mild sulcal and moderate ventricular prominence with chronic small vessel ischemic disease. No intra-axial mass nor extra-axial fluid collections. No acute large vascular territory infarcts. Vascular: Atherosclerosis of the carotid siphons. No hyperdense vessel sign. Skull: No acute skull fracture or suspicious osseous lesions. Sinuses/Orbits: Small right mastoid effusion. No acute sinus disease. Intact orbits and globes. Other: None IMPRESSION: 1. Chronic stable cerebral atrophy with small vessel ischemia. 2. Chronic right PCA distribution infarct with right occipital lobe encephalomalacia. Electronically Signed   By: Ashley Royalty M.D.   On: 02/01/2018 16:01      Assessment & Plan:    Principal Problem:   Fever Active Problems:   CKD (chronic kidney disease) stage 4, GFR  15-29 ml/min (HCC)    Fever  Blood culture x2 pending Urine culture pending CXR negative Check ESR Observe  Fall, mechanical per pt ? Blindness or poor vision PT to evaluate and tx  Stroke Cont aspirin Check lipid  Hypertension Cont verapamil Cont carvedilol  CKD stage 4 Check cbc, cmp in am  Anxiety Cont Xanax  2:1 HB s/p pacer  DVT Prophylaxis     Lovenox - SCDs   AM Labs Ordered, also please review Full Orders  Family Communication: Admission, patients condition and plan of care including tests being ordered have been discussed with the patient who indicate understanding and agree with the plan  and Code Status.  Code Status FULL CODE  Likely DC to  home  Condition GUARDED   Consults called:    none  Admission status: observation  Time spent in minutes : 45   Jani Gravel M.D on 02/01/2018 at 7:00 PM  Between 7am to 7pm - Pager - [phone]    After 7pm go to [email or web address] - password [facility]  [facility] - Office  [phone]

## 2018-02-01 NOTE — ED Provider Notes (Signed)
[facility] DEPT Provider Note   CSN: [id number] Arrival date & time: 02/01/18  1210   History   Chief Complaint Chief Complaint  Patient presents with  . Shoulder Pain    right    HPI Victoria Lloyd is a 84 y.o. female is here for evaluation of right shoulder pain and thoracic/lumbar back pain worsening this morning, worse with movement and palpation. Patient states she fell 2-3 weeks ago and has had pain since however usually not this bad. Patient is oriented to self, place, time and events however caregiver at bedside states that she is not acting normal. Has noticed patient has been more quiet and not herself since the middle of the week. Typically, patient is very active, likes to walk around and stay busy however she's been mostly tired lately and not her spunky self.  This morning the caregiver checked in on her and patient did not want to get out of bed which is very atypical for her. Caregiver denies fevers, cough, vomiting, diarrhea, constipation, strong odor to her urine however states patient has been progressively eating less.  HPI  Past Medical History:  Diagnosis Date  . Chronic diastolic (congestive) heart failure ([place])   . CKD (chronic kidney disease), stage IV ([place])   . Complete heart block (HCC)    a. s/p MDT dual chamber PPM   . Hypertension     Patient Active Problem List   Diagnosis Date Noted  . Fever 02/01/2018  . UTI (lower urinary tract infection)   . Anxiety 06/03/2016  . SOB (shortness of breath) 06/03/2016  . Elevated troponin 06/03/2016  . CHF exacerbation ([place]) 06/03/2016  . Acute on chronic diastolic (congestive) heart failure ([place])   . Syncope 04/24/2016  . Pacemaker- [employer] Jude impalnted 08/04/14 08/06/2014  . AV block, 3rd degree (HCC) 08/03/2014  . Acute CHF- secondary to bradycardia on admision (Nl LVF by echo) 08/03/2014  . CKD (chronic kidney disease) stage 4, GFR 15-29 ml/min (HCC) 08/03/2014  . Bradycardia  08/03/2014  . Hypertension 03/15/2011    Past Surgical History:  Procedure Laterality Date  . PERMANENT PACEMAKER INSERTION N/A 08/04/2014   Procedure: PERMANENT PACEMAKER INSERTION;  Surgeon: Deboraha Sprang, MD;  Location[facility] C[facility] LAB;  Service: Cardiovascular;  Laterality: N/A;    OB History    No data available       Home Medications    Prior to Admission medications   Medication Sig Start Date End Date Taking? Authorizing Provider  ALPRAZolam (XANAX) 0.25 MG tablet Take 0.125 mg by mouth at bedtime.  11/28/17  Yes [provider]  aspirin 81 MG tablet Take 81 mg by mouth daily.     Yes [provider]  calcium-vitamin D (OSCAL WITH D) 500-200 MG-UNIT per tablet Take 1 tablet by mouth daily.     Yes [provider]  carvedilol (COREG) 12.5 MG tablet Take 12.5 mg by mouth 2 (two) times daily with a meal.   Yes [provider]  dicyclomine (BENTYL) 10 MG capsule Take 10 mg by mouth at bedtime.  11/27/17  Yes [provider]  furosemide (LASIX) 20 MG tablet Take 1 tablet (20 mg total) by mouth daily. 06/06/16  Yes Mikhail, Velta Addison, DO  verapamil (CALAN-SR) 180 MG CR tablet TK 1 T PO QD 01/30/18  Yes [provider]  acetaminophen (TYLENOL) 325 MG tablet Take 1-2 tablets (325-650 mg total) by mouth every 4 (four) hours as needed for mild pain. Patient  not taking: Reported on 02/01/2018 08/06/14   Erlene Quan, PA-C  ALPRAZolam [name]) 0.5 MG tablet Take 0.5 tablets (0.25 mg total) by mouth at bedtime as needed for anxiety. Patient not taking: Reported on 02/01/2018 06/06/16   Cristal Ford, DO  cycloSPORINE (RESTASIS) 0.05 % ophthalmic emulsion 1 drop 2 (two) times daily. Both eyes: 1 morning, 1 nightly, PRN for dry-eye syndrome    [provider]  dextromethorphan-guaiFENesin (MUCINEX DM) 30-600 MG 12hr tablet Take 1 tablet by mouth 2 (two) times daily. Patient not taking: Reported on 02/01/2018 06/06/16   Cristal Ford, DO    lidocaine (LIDODERM) 5 % Place 1 patch onto the skin daily. Patient not taking: Reported on 02/01/2018 06/06/16   Cristal Ford, DO  ondansetron (ZOFRAN-ODT) 4 MG disintegrating tablet Take 1 tablet (4 mg total) by mouth every 8 (eight) hours as needed for nausea or vomiting. Patient not taking: Reported on 02/01/2018 04/29/16   Tomma Rakers, MD  senna (SENOKOT) 8.6 MG TABS tablet Take 2 tablets (17.2 mg total) by mouth daily. Patient not taking: Reported on 02/01/2018 04/29/16   Tomma Rakers, MD  verapamil (CALAN) 120 MG tablet Take 1 tablet (120 mg total) by mouth every evening. Patient not taking: Reported on 02/01/2018 06/06/16   Cristal Ford, DO    Family History Family History  Problem Relation Age of Onset  . Hypertension Mother   . Heart attack Father     Social History Social History   Tobacco Use  . Smoking status: Never Smoker  . Smokeless tobacco: Never Used  Substance Use Topics  . Alcohol use: No  . Drug use: No     Allergies   Codeine; Sulfa antibiotics; Lactose intolerance (gi); and Milk-related compounds   Review of Systems Review of Systems  Constitutional: Positive for appetite change.  Musculoskeletal: Positive for arthralgias and back pain.  Psychiatric/Behavioral: Positive for behavioral problems (changes in behavior).  All other systems reviewed and are negative.    Physical Exam Updated Vital Signs BP (!) 142/89 (BP Location: Right Arm)   Pulse 75   Temp (!) 100.6 F (38.1 C) (Rectal)   Resp 18   SpO2 95%   Physical Exam  Constitutional: She is oriented to person, place, and time. She appears well-developed and well-nourished. No distress.  Non toxic. Laying awake in hall bed.   HENT:  Head: Normocephalic and atraumatic.  Nose: Nose normal.  Mouth/Throat: No oropharyngeal exudate.  Moist mucous membranes. Atraumatic. No tenderness to facial or scalp bones.   Eyes: Conjunctivae and EOM are normal. Pupils are  equal, round, and reactive to light.  Neck: Normal range of motion.  No midline c-spine tenderness. Neck supple. Full PROM of neck without pain or rigidity.   Cardiovascular: Normal rate, regular rhythm and intact distal pulses.  No murmur heard. 2+ DP and radial pulses bilaterally. No LE edema.   Pulmonary/Chest: Effort normal and breath sounds normal. No respiratory distress. She has no wheezes. She has no rales.  Abdominal: Soft. Bowel sounds are normal. There is tenderness in the suprapubic area.  No G/R/R. No CVA tenderness.   Musculoskeletal: She exhibits no deformity.       Right shoulder: She exhibits decreased range of motion and tenderness.       Thoracic back: She exhibits tenderness and bony tenderness.       Lumbar back: She exhibits tenderness and bony tenderness.  Right shoulder: diffuse posterior right shoulder tenderness, patient refuses to move right arm.  No overlaying skin changes or injury.  Thoracic/Lumbar spine: midline and paraspinal TL spine tenderness w/o step offs, no overlaying skin changes or injury.  Full PROM of left UE and LEs without pain. IR/ER of bilateral hips causes low back pain. Negative SLR bilaterally.  Neurological: She is alert and oriented to person, place, and time.  Alert and oriented to self, place, time and event.  Speech is fluent without obvious dysarthria or dysphasia. Strength 5/5 with hand grip and ankle F/E.   Sensation to light touch intact in hands and feet. Unable to assess truncal sway, pronator drift due to back and shoulder pain No leg drop bilaterally.  Normal finger-to-nose.  CN I and VIII not tested. CN II-XII grossly intact bilaterally.   Skin: Skin is warm and dry. Capillary refill takes less than 2 seconds.  No skin injury to thorax, back or buttocks.   Psychiatric: She has a normal mood and affect. Her behavior is normal. Judgment and thought content normal.  Nursing note and vitals reviewed.    ED Treatments / Results    Labs (all labs ordered are listed, but only abnormal results are displayed) Labs Reviewed  CBC WITH DIFFERENTIAL/PLATELET - Abnormal; Notable for the following components:      Result Value   Monocytes Absolute 1.1 (*)    All other components within normal limits  COMPREHENSIVE METABOLIC PANEL - Abnormal; Notable for the following components:   Chloride 98 (*)    Glucose, Bld 151 (*)    Creatinine, Ser 1.17 (*)    ALT 11 (*)    GFR calc non Af Amer 39 (*)    GFR calc Af Amer 45 (*)    All other components within normal limits  ACETAMINOPHEN LEVEL - Abnormal; Notable for the following components:   Acetaminophen (Tylenol), Serum <10 (*)    All other components within normal limits  CULTURE, BLOOD (ROUTINE X 2)  CULTURE, BLOOD (ROUTINE X 2)  URINE CULTURE  URINALYSIS, ROUTINE W REFLEX MICROSCOPIC  SALICYLATE LEVEL  AMMONIA  INFLUENZA PANEL BY PCR (TYPE A & B)  I-STAT CG4 LACTIC ACID, ED  I-STAT TROPONIN, ED    EKG  EKG Interpretation  Date/Time:  Monday February 01 2018 16:04:16 EDT Ventricular Rate:  74 PR Interval:    QRS Duration: 155 QT Interval:  514 QTC Calculation: 571 R Axis:   -107 Text Interpretation:  Ventricular-paced complexes No further analysis attempted due to paced rhythm paced  ryhtym  Confirmed by Zenovia Jarred [phone]) on 02/01/2018 4:09:23 PM       Radiology Dg Chest 2 View  Result Date: 02/01/2018 CLINICAL DATA:  [place] change.  Recent fall. EXAM: CHEST - 2 VIEW COMPARISON:  06/03/2016 chest radiograph. FINDINGS: Stable configuration of 2 lead left subclavian pacemaker. Stable cardiomediastinal silhouette with mild cardiomegaly. No pneumothorax. No pleural effusion. Stable moderate elevation of the right hemidiaphragm. No pulmonary edema. No acute consolidative airspace disease. IMPRESSION: No acute cardiopulmonary disease. Stable mild cardiomegaly without pulmonary edema. Stable moderate elevation of the right hemidiaphragm. Electronically Signed    By: Ilona Sorrel M.D.   On: 02/01/2018 15:52   Dg Thoracic Spine 2 View  Result Date: 02/01/2018 CLINICAL DATA:  Fall several weeks ago with back pain, initial encounter EXAM: THORACIC SPINE 2 VIEWS COMPARISON:  04/25/2016 FINDINGS: Vertebral body height is well maintained. Mild osteophytic changes are seen. No paraspinal mass lesion is noted. No definitive rib abnormality is noted. Pacing device is again seen. IMPRESSION: Mild degenerative change without  acute abnormality. Electronically Signed   By: Inez Catalina M.D.   On: 02/01/2018 15:55   Dg Lumbar Spine Complete  Result Date: 02/01/2018 CLINICAL DATA:  Chronic back pain. EXAM: LUMBAR SPINE - COMPLETE 4+ VIEW COMPARISON:  Radiographs of Apr 03, 2009. FINDINGS: Stable grade 2 anterolisthesis of L4-5 is noted secondary to posterior facet joint hypertrophy. Mild degenerative disc disease is noted at L2-3 and L4-5. No acute fracture is noted. Atherosclerosis of abdominal aorta is noted. IMPRESSION: Stable grade 2 anterolisthesis of L4-5 is noted secondary to posterior facet joint hypertrophy. Mild multilevel degenerative disc disease is noted. No acute abnormality is noted. Aortic Atherosclerosis (ICD10-I70.0). Electronically Signed   By: Marijo Conception, M.D.   On: 02/01/2018 15:54   Dg Pelvis 1-2 Views  Result Date: 02/01/2018 CLINICAL DATA:  Behavior change.  Recent fall. EXAM: PELVIS - 1-2 VIEW COMPARISON:  01/31/2016 sacrococcygeal radiographs FINDINGS: No acute pelvic fracture. No pelvic diastasis. Healed deformities in the left pubic rami are unchanged since 2017. No suspicious focal osseous lesions. No evidence of hip dislocation on this single frontal view. Degenerative changes in the visualized lower lumbar spine. IMPRESSION: No acute pelvic fracture. Chronic healed left pubic rami deformities. Electronically Signed   By: Ilona Sorrel M.D.   On: 02/01/2018 15:54   Dg Shoulder Right  Result Date: 02/01/2018 CLINICAL DATA:  Shoulder pain.  EXAM: RIGHT SHOULDER - 2+ VIEW COMPARISON:  08/04/2016 FINDINGS: There is no acute fracture or subluxation identified. No radio-opaque foreign body or soft tissue calcification. Moderate degenerative changes are noted at the acromioclavicular joint. Large calcified mediastinal and hilar lymph nodes identified. IMPRESSION: 1. AC joint osteoarthritis. 2. No acute findings. Electronically Signed   By: Kerby Moors M.D.   On: 02/01/2018 15:54   Ct Head Wo Contrast  Result Date: 02/01/2018 CLINICAL DATA:  Chronic pain.  Patient fell 3 weeks ago. EXAM: CT HEAD WITHOUT CONTRAST TECHNIQUE: Contiguous axial images were obtained from the base of the skull through the vertex without intravenous contrast. COMPARISON:  09/06/2011 FINDINGS: Brain: Encephalomalacia in the right occipital lobe. Chronic cerebral atrophy with mild sulcal and moderate ventricular prominence with chronic small vessel ischemic disease. No intra-axial mass nor extra-axial fluid collections. No acute large vascular territory infarcts. Vascular: Atherosclerosis of the carotid siphons. No hyperdense vessel sign. Skull: No acute skull fracture or suspicious osseous lesions. Sinuses/Orbits: Small right mastoid effusion. No acute sinus disease. Intact orbits and globes. Other: None IMPRESSION: 1. Chronic stable cerebral atrophy with small vessel ischemia. 2. Chronic right PCA distribution infarct with right occipital lobe encephalomalacia. Electronically Signed   By: Ashley Royalty M.D.   On: 02/01/2018 16:01    Procedures Procedures (including critical care time)  Medications Ordered in ED Medications  oseltamivir (TAMIFLU) capsule 30 mg (30 mg Oral Given 02/01/18 1738)     Initial Impression / Assessment and Plan / ED Course  I have reviewed the triage vital signs and the nursing notes.  Pertinent labs & imaging results that were available during my care of the patient were reviewed by me and considered in my medical decision making (see  chart for details).  Clinical Course as of Feb 02 1828  Mon Feb 01, 2018  1557 Creatinine: (!) 1.17 [CG]  1557 GFR, Est African American: (!) 45 [CG]  1722 Temp: (!) 100.6 F (38.1 C) [CG]    Clinical Course User Index [CG] Kinnie Feil, PA-C   84 year old here with initial complaint of back pain and right  shoulder pain. Caregiver reports changes in behavior, decreased appetite and oral intake since Wednesday. On my assessment, patient had a forceful wet sounding cough. She cannot tell me when this started.  ED workup today remarkable for mild elevation of creatinine. She has a rectal fever. Apparently ambulated the patient and she is unable to get out of bed on her own, she requires a 2 person assist and is unsteady on her feet. Patient's son, Dr. Dorothea Glassman, contacted me and told me that he noticed patient was not herself last night or yesterday. She did not take her medications as she usually does. There is concern that patient lives alone and caregivers check in with her 2-3 hours during the day. Will add influenza. Given age, risk, fever, living situation patient will benefit from observation.  Final Clinical Impressions(s) / ED Diagnoses   [id number]: Spoke to Dr Maudie Mercury who accepts patient, appreciate his assistance.   Patient's son Dr. Dorothea Glassman [phone], caregiver Kendrick Fries [phone] Final diagnoses:  None    ED Discharge Orders    None       Arlean Hopping 02/01/18 1829    Macarthur Critchley, MD 02/03/18 [phone]

## 2018-02-01 NOTE — ED Notes (Signed)
EKG and Cardiac Monitoring attempted x3. Delay due to Provider and Nurses at bedside for venous access and other procedures.

## 2018-02-01 NOTE — ED Notes (Signed)
ED TO INPATIENT HANDOFF REPORT  Name/Age/Gender Victoria Lloyd 84 y.o. female  Code Status Code Status History    Date Active Date Inactive Code Status Order ID Comments User Context   06/03/2016 22:08 06/06/2016 20:18 Full Code [id number]  Ivor Costa, MD Inpatient   04/24/2016 18:16 04/29/2016 16:00 Full Code [id number]  Mendel Corning, MD Inpatient   08/04/2014 17:14 08/06/2014 14:59 Full Code [id number]  Deboraha Sprang, MD Inpatient   08/03/2014 18:33 08/04/2014 17:14 Full Code [id number]  Eileen Stanford, PA-C Inpatient      Home/SNF/Other Home  Chief Complaint arm and back pain  Level of Care/Admitting Diagnosis ED Disposition    ED Disposition Condition [facility] Area: [facility] [[id number]]  Level of Care: Telemetry [5]  Admit to tele based on following criteria: Other see comments  Comments: monitor for ischemia  Diagnosis: Fever [633354]  Admitting Physician: Jani Gravel [[id number]]  Attending Physician: Jani Gravel [[id number]]  PT Class (Do Not Modify): Observation [104]  PT Acc Code (Do Not Modify): Observation [10022]       Medical History Past Medical History:  Diagnosis Date  . Chronic diastolic (congestive) heart failure ([place])   . CKD (chronic kidney disease), stage IV ([place])   . Complete heart block ([facility])    a. s/p MDT dual chamber PPM   . Hypertension     Allergies Allergies  Allergen Reactions  . Codeine Other (See Comments)    Unknown; patient cannot recall the reaction  . Sulfa Antibiotics Other (See Comments)    Unknown; patient cannot recall the reaction  . Lactose Intolerance (Gi) Nausea And Vomiting  . Milk-Related Compounds Nausea And Vomiting    IV Location/Drains/Wounds Patient Lines/Drains/Airways Status   Active Line/Drains/Airways    Name:   Placement date:   Placement time:   Site:   Days:   Peripheral IV 02/01/18 Right Forearm   02/01/18    1513    Forearm   less than 1   Incision (Closed) 08/04/14 Chest  Left   08/04/14    1756     1277          Labs/Imaging Results for orders placed or performed during the hospital encounter of 02/01/18 (from the past 48 hour(s))  CBC WITH DIFFERENTIAL     Status: Abnormal   Collection Time: 02/01/18  1:27 PM  Result Value Ref Range   WBC 8.5 4.0 - 10.5 K/uL   RBC 4.82 3.87 - 5.11 MIL/uL   Hemoglobin 14.5 12.0 - 15.0 g/dL   HCT 44.2 36.0 - 46.0 %   MCV 91.7 78.0 - 100.0 fL   MCH 30.1 26.0 - 34.0 pg   MCHC 32.8 30.0 - 36.0 g/dL   RDW 14.1 11.5 - 15.5 %   Platelets 297 150 - 400 K/uL   Neutrophils Relative % 74 %   Neutro Abs 6.2 1.7 - 7.7 K/uL   Lymphocytes Relative 14 %   Lymphs Abs 1.2 0.7 - 4.0 K/uL   Monocytes Relative 12 %   Monocytes Absolute 1.1 (H) 0.1 - 1.0 K/uL   Eosinophils Relative 0 %   Eosinophils Absolute 0.0 0.0 - 0.7 K/uL   Basophils Relative 0 %   Basophils Absolute 0.0 0.0 - 0.1 K/uL    Comment: Performed at [facility], [place] [address] [place], [place] [address]  Ammonia     Status: None   Collection Time: 02/01/18  2:10 PM  Result  Value Ref Range   Ammonia 22 9 - 35 umol/L    Comment: Performed at [facility], [place] [address], [place], [place] [address]  Urinalysis, Routine w reflex microscopic     Status: None   Collection Time: 02/01/18  2:40 PM  Result Value Ref Range   Color, Urine YELLOW YELLOW   APPearance CLEAR CLEAR   Specific Gravity, Urine 1.010 1.005 - 1.030   pH 7.0 5.0 - 8.0   Glucose, UA NEGATIVE NEGATIVE mg/dL   Hgb urine dipstick NEGATIVE NEGATIVE   Bilirubin Urine NEGATIVE NEGATIVE   Ketones, ur NEGATIVE NEGATIVE mg/dL   Protein, ur NEGATIVE NEGATIVE mg/dL   Nitrite NEGATIVE NEGATIVE   Leukocytes, UA NEGATIVE NEGATIVE    Comment: Performed at [place] [address] [place], [place] [address]  I-Stat CG4 Lactic Acid, ED  (not at  [facility])     Status: None   Collection Time: 02/01/18  2:59 PM  Result Value Ref Range   Lactic Acid,  Venous 1.59 0.5 - 1.9 mmol/L  Comprehensive metabolic panel     Status: Abnormal   Collection Time: 02/01/18  3:00 PM  Result Value Ref Range   Sodium 141 135 - 145 mmol/L   Potassium 3.6 3.5 - 5.1 mmol/L   Chloride 98 (L) 101 - 111 mmol/L   CO2 31 22 - 32 mmol/L   Glucose, Bld 151 (H) 65 - 99 mg/dL   BUN 20 6 - 20 mg/dL   Creatinine, Ser 1.17 (H) 0.44 - 1.00 mg/dL   Calcium 9.8 8.9 - 10.3 mg/dL   Total Protein 7.9 6.5 - 8.1 g/dL   Albumin 3.6 3.5 - 5.0 g/dL   AST 21 15 - 41 U/L   ALT 11 (L) 14 - 54 U/L   Alkaline Phosphatase 55 38 - 126 U/L   Total Bilirubin 0.8 0.3 - 1.2 mg/dL   GFR calc non Af Amer 39 (L) >60 mL/min   GFR calc Af Amer 45 (L) >60 mL/min    Comment: (NOTE) The eGFR has been calculated using the CKD EPI equation. This calculation has not been validated in all clinical situations. eGFR's persistently <60 mL/min signify possible Chronic Kidney Disease.    Anion gap 12 5 - 15    Comment: Performed at [facility], [place] [address], [place], [place] [address]  Acetaminophen level     Status: Abnormal   Collection Time: 02/01/18  3:00 PM  Result Value Ref Range   Acetaminophen (Tylenol), Serum <10 (L) 10 - 30 ug/mL    Comment:        THERAPEUTIC CONCENTRATIONS VARY SIGNIFICANTLY. A RANGE OF 10-30 ug/mL MAY BE AN EFFECTIVE CONCENTRATION FOR MANY PATIENTS. HOWEVER, SOME ARE BEST TREATED AT CONCENTRATIONS OUTSIDE THIS RANGE. ACETAMINOPHEN CONCENTRATIONS >150 ug/mL AT 4 HOURS AFTER INGESTION AND >50 ug/mL AT 12 HOURS AFTER INGESTION ARE OFTEN ASSOCIATED WITH TOXIC REACTIONS. Performed at [facility], [place] [address], [place], [place] [address]   Salicylate level     Status: None   Collection Time: 02/01/18  3:00 PM  Result Value Ref Range   Salicylate Lvl <4.6 2.8 - 30.0 mg/dL    Comment: Performed at [facility], [place] [address], [place], [place] [address]  Influenza panel by PCR (type A & B)      Status: None   Collection Time: 02/01/18  5:05 PM  Result Value Ref Range   Influenza A By PCR NEGATIVE  NEGATIVE   Influenza B By PCR NEGATIVE NEGATIVE    Comment: (NOTE) The Xpert Xpress Flu assay is intended as an aid in the diagnosis of  influenza and should not be used as a sole basis for treatment.  This  assay is FDA approved for nasopharyngeal swab specimens only. Nasal  washings and aspirates are unacceptable for Xpert Xpress Flu testing. Performed at [facility], [place] [address], [place], [place] [address]    Dg Chest 2 View  Result Date: 02/01/2018 CLINICAL DATA:  [place] change.  Recent fall. EXAM: CHEST - 2 VIEW COMPARISON:  06/03/2016 chest radiograph. FINDINGS: Stable configuration of 2 lead left subclavian pacemaker. Stable cardiomediastinal silhouette with mild cardiomegaly. No pneumothorax. No pleural effusion. Stable moderate elevation of the right hemidiaphragm. No pulmonary edema. No acute consolidative airspace disease. IMPRESSION: No acute cardiopulmonary disease. Stable mild cardiomegaly without pulmonary edema. Stable moderate elevation of the right hemidiaphragm. Electronically Signed   By: Ilona Sorrel M.D.   On: 02/01/2018 15:52   Dg Thoracic Spine 2 View  Result Date: 02/01/2018 CLINICAL DATA:  Fall several weeks ago with back pain, initial encounter EXAM: THORACIC SPINE 2 VIEWS COMPARISON:  04/25/2016 FINDINGS: Vertebral body height is well maintained. Mild osteophytic changes are seen. No paraspinal mass lesion is noted. No definitive rib abnormality is noted. Pacing device is again seen. IMPRESSION: Mild degenerative change without acute abnormality. Electronically Signed   By: Inez Catalina M.D.   On: 02/01/2018 15:55   Dg Lumbar Spine Complete  Result Date: 02/01/2018 CLINICAL DATA:  Chronic back pain. EXAM: LUMBAR SPINE - COMPLETE 4+ VIEW COMPARISON:  Radiographs of Apr 03, 2009. FINDINGS: Stable grade 2 anterolisthesis of L4-5 is noted  secondary to posterior facet joint hypertrophy. Mild degenerative disc disease is noted at L2-3 and L4-5. No acute fracture is noted. Atherosclerosis of abdominal aorta is noted. IMPRESSION: Stable grade 2 anterolisthesis of L4-5 is noted secondary to posterior facet joint hypertrophy. Mild multilevel degenerative disc disease is noted. No acute abnormality is noted. Aortic Atherosclerosis (ICD10-I70.0). Electronically Signed   By: Marijo Conception, M.D.   On: 02/01/2018 15:54   Dg Pelvis 1-2 Views  Result Date: 02/01/2018 CLINICAL DATA:  Behavior change.  Recent fall. EXAM: PELVIS - 1-2 VIEW COMPARISON:  01/31/2016 sacrococcygeal radiographs FINDINGS: No acute pelvic fracture. No pelvic diastasis. Healed deformities in the left pubic rami are unchanged since 2017. No suspicious focal osseous lesions. No evidence of hip dislocation on this single frontal view. Degenerative changes in the visualized lower lumbar spine. IMPRESSION: No acute pelvic fracture. Chronic healed left pubic rami deformities. Electronically Signed   By: Ilona Sorrel M.D.   On: 02/01/2018 15:54   Dg Shoulder Right  Result Date: 02/01/2018 CLINICAL DATA:  Shoulder pain. EXAM: RIGHT SHOULDER - 2+ VIEW COMPARISON:  08/04/2016 FINDINGS: There is no acute fracture or subluxation identified. No radio-opaque foreign body or soft tissue calcification. Moderate degenerative changes are noted at the acromioclavicular joint. Large calcified mediastinal and hilar lymph nodes identified. IMPRESSION: 1. AC joint osteoarthritis. 2. No acute findings. Electronically Signed   By: Kerby Moors M.D.   On: 02/01/2018 15:54   Ct Head Wo Contrast  Result Date: 02/01/2018 CLINICAL DATA:  Chronic pain.  Patient fell 3 weeks ago. EXAM: CT HEAD WITHOUT CONTRAST TECHNIQUE: Contiguous axial images were obtained from the base of the skull through the vertex without intravenous contrast. COMPARISON:  09/06/2011 FINDINGS: Brain: Encephalomalacia in the right  occipital lobe. Chronic cerebral  atrophy with mild sulcal and moderate ventricular prominence with chronic small vessel ischemic disease. No intra-axial mass nor extra-axial fluid collections. No acute large vascular territory infarcts. Vascular: Atherosclerosis of the carotid siphons. No hyperdense vessel sign. Skull: No acute skull fracture or suspicious osseous lesions. Sinuses/Orbits: Small right mastoid effusion. No acute sinus disease. Intact orbits and globes. Other: None IMPRESSION: 1. Chronic stable cerebral atrophy with small vessel ischemia. 2. Chronic right PCA distribution infarct with right occipital lobe encephalomalacia. Electronically Signed   By: Ashley Royalty M.D.   On: 02/01/2018 16:01    Pending Labs Unresulted Labs (From admission, onward)   Start     Ordered   02/01/18 1327  Blood Culture (routine x 2)  BLOOD CULTURE X 2,   STAT     02/01/18 1328   02/01/18 1327  Urine culture  STAT,   STAT     02/01/18 1328      Vitals/Pain Today's Vitals   02/01/18 1222 02/01/18 1403 02/01/18 1634 02/01/18 1718  BP: (!) 144/62  (!) 132/115 (!) 142/89  Pulse: 66  75   Resp: _0 Temp: 97.7 F (36.5 C) 100.3 F (37.9 C)  (!) 100.6 F (38.1 C)  TempSrc: Oral Rectal  Rectal  SpO2: 95%  93% 95%  PainSc:    10-Worst pain ever    Isolation Precautions Droplet precaution  Medications Medications  acetaminophen (TYLENOL) tablet 1,000 mg (not administered)  oseltamivir (TAMIFLU) capsule 30 mg (30 mg Oral Given 02/01/18 1738)    Mobility Non-ambulatory due to current weakness. Pt is reportedly normally ambulatory

## 2018-02-01 NOTE — ED Triage Notes (Signed)
Per EMS, pt complains of right shoulder pain with movement. Pt reports fall 3 weeks ago, denies more recent injury. Pt also complains of back pain, has chronic back pain.

## 2018-02-01 NOTE — ED Notes (Signed)
RN attempted to get pt up but pr reports her shoulder hurting and her being weak.

## 2018-02-01 NOTE — ED Notes (Signed)
Patient made aware a urine sample is needed.  

## 2018-02-01 NOTE — ED Notes (Signed)
EKG attempted 5th time. Patient transported to CT.

## 2018-02-02 DIAGNOSIS — R509 Fever, unspecified: Secondary | ICD-10-CM | POA: Diagnosis not present

## 2018-02-02 DIAGNOSIS — Y92009 Unspecified place in unspecified non-institutional (private) residence as the place of occurrence of the external cause: Secondary | ICD-10-CM

## 2018-02-02 DIAGNOSIS — N184 Chronic kidney disease, stage 4 (severe): Secondary | ICD-10-CM | POA: Diagnosis not present

## 2018-02-02 DIAGNOSIS — I10 Essential (primary) hypertension: Secondary | ICD-10-CM | POA: Diagnosis not present

## 2018-02-02 DIAGNOSIS — W19XXXA Unspecified fall, initial encounter: Secondary | ICD-10-CM

## 2018-02-02 LAB — CBC
HEMATOCRIT: 44.2 % (ref 36.0–46.0)
HEMOGLOBIN: 14.4 g/dL (ref 12.0–15.0)
MCH: 30.2 pg (ref 26.0–34.0)
MCHC: 32.6 g/dL (ref 30.0–36.0)
MCV: 92.7 fL (ref 78.0–100.0)
Platelets: 278 10*3/uL (ref 150–400)
RBC: 4.77 MIL/uL (ref 3.87–5.11)
RDW: 14.1 % (ref 11.5–15.5)
WBC: 9.4 10*3/uL (ref 4.0–10.5)

## 2018-02-02 LAB — COMPREHENSIVE METABOLIC PANEL
ALBUMIN: 3.4 g/dL — AB (ref 3.5–5.0)
ALT: 7 U/L — ABNORMAL LOW (ref 14–54)
ANION GAP: 13 (ref 5–15)
AST: 14 U/L — AB (ref 15–41)
Alkaline Phosphatase: 54 U/L (ref 38–126)
BILIRUBIN TOTAL: 0.7 mg/dL (ref 0.3–1.2)
BUN: 19 mg/dL (ref 6–20)
CHLORIDE: 101 mmol/L (ref 101–111)
CO2: 26 mmol/L (ref 22–32)
Calcium: 9.7 mg/dL (ref 8.9–10.3)
Creatinine, Ser: 0.99 mg/dL (ref 0.44–1.00)
GFR calc Af Amer: 55 mL/min — ABNORMAL LOW (ref >60)
GFR calc non Af Amer: 48 mL/min — ABNORMAL LOW (ref >60)
Glucose, Bld: 147 mg/dL — ABNORMAL HIGH (ref 65–99)
POTASSIUM: 3.5 mmol/L (ref 3.5–5.1)
Sodium: 140 mmol/L (ref 135–145)
TOTAL PROTEIN: 7.5 g/dL (ref 6.5–8.1)

## 2018-02-02 LAB — LIPID PANEL
CHOLESTEROL: 193 mg/dL (ref 0–200)
HDL: 54 mg/dL (ref >40)
LDL Cholesterol: 126 mg/dL — ABNORMAL HIGH (ref 0–99)
Total CHOL/HDL Ratio: 3.6 RATIO
Triglycerides: 64 mg/dL (ref <150)
VLDL: 13 mg/dL (ref 0–40)

## 2018-02-02 LAB — URINE CULTURE: Culture: <10000 — AB

## 2018-02-02 MED ORDER — TRAMADOL HCL 50 MG PO TABS: 50.0000 mg | ORAL_TABLET | Freq: Four times a day (QID) | ORAL | Status: DC | PRN

## 2018-02-02 NOTE — Progress Notes (Signed)
Spoke with pt's son Dr. Marisa Hua concerning discharge plans and SNF placement. Dr. Dorothea Glassman agreed with pt going to [facility]. CSW aware.

## 2018-02-02 NOTE — Evaluation (Signed)
Physical Therapy Evaluation Patient Details Name: Victoria Lloyd MRN: [id number] DOB: 08/13/1922 Today's Date: 02/02/2018   History of Present Illness  84 yo female admitted with fever, pain from fall ~2 weeks PTA. Hx of CKD, CHF, complete HB, pacemaker, CVA, falls.   Clinical Impression  On eval, pt required Min-Mod assist for mobility. She was able to take a few steps in the room with a RW. Pt is confused - A&Ox1. Pt presents with general weakness, decreased activity tolerance, and impaired gait and balance. Multimodal cues required during session. No family present during session. Do not feel pt is safe to be at home alone. Recommend ST rehab at SNF unless pt will have 24/7 supervision/assist available at home. If pt returns home, HHPT with 24 supervision/assist. Will follow and progress activity as tolerated.        Follow Up Recommendations SNF(unless pt will have 24/7 supervision/assist at home. )    Equipment Recommendations  None recommended by PT    Recommendations for Other Services       Precautions / Restrictions Precautions Precautions: Fall Restrictions Weight Bearing Restrictions: No      Mobility  Bed Mobility Overal bed mobility: Needs Assistance Bed Mobility: Supine to Sit     Supine to sit: Mod assist;HOB elevated     General bed mobility comments: Assist for trunk and LEs. Increased time. Multimodal Cues for safety, technique.   Transfers Overall transfer level: Needs assistance Equipment used: Rolling walker (2 wheeled) Transfers: Sit to/from [facility] Sit to Stand: Min assist Stand pivot transfers: Min assist       General transfer comment: Assist to rise, stabilize, control descent. Multimodal cues required for safety, techique. Stand pivot, bed to recliner, with RW  Ambulation/Gait Ambulation/Gait assistance: Min assist Ambulation Distance (Feet): 3 Feet Assistive device: Rolling walker (2 wheeled)       General Gait  Details: Pt took a few steps in room with RW before pivoting to recliner. Assist to stabilize and maneuver safely with RW.   Stairs            Wheelchair Mobility    Modified Rankin (Stroke Patients Only)       Balance Overall balance assessment: Needs assistance         Standing balance support: Bilateral upper extremity supported Standing balance-Leahy Scale: Poor                               Pertinent Vitals/Pain Pain Assessment: Faces Faces Pain Scale: Hurts little more Pain Location: head, L leg Pain Descriptors / Indicators: Grimacing;Guarding;Discomfort Pain Intervention(s): Limited activity within patient's tolerance;Repositioned    Home Living Family/patient expects to be discharged to:: Private residence Living Arrangements: Alone Available Help at Discharge: Family(caregivers) Type of Home: House           Additional Comments: unsure of DME    Prior Function Level of Independence: Needs assistance   Gait / Transfers Assistance Needed: ambulatory per chart           Hand Dominance        Extremity/Trunk Assessment   Upper Extremity Assessment Upper Extremity Assessment: Generalized weakness    Lower Extremity Assessment Lower Extremity Assessment: Generalized weakness    Cervical / Trunk Assessment Cervical / Trunk Assessment: Normal  Communication   Communication: No difficulties  Cognition Arousal/Alertness: Awake/alert Behavior During Therapy: WFL for tasks assessed/performed Overall Cognitive Status: No family/caregiver present to determine  baseline cognitive functioning Area of Impairment: Orientation;Memory;Safety/judgement;Problem solving                 Orientation Level: Disoriented to;Place;Time;Situation   Memory: Decreased short-term memory   Safety/Judgement: Decreased awareness of safety;Decreased awareness of deficits   Problem Solving: Requires tactile cues;Requires verbal cues         General Comments      Exercises     Assessment/Plan    PT Assessment Patient needs continued PT services  PT Problem List Decreased strength;Decreased mobility;Decreased activity tolerance;Decreased knowledge of use of DME;Decreased balance;Decreased cognition;Decreased safety awareness       PT Treatment Interventions DME instruction;Gait training;Functional mobility training;Therapeutic activities;Balance training;Patient/family education;Therapeutic exercise    PT Goals (Current goals can be found in the Care Plan section)  Acute Rehab PT Goals Patient Stated Goal: none stated PT Goal Formulation: Patient unable to participate in goal setting Time For Goal Achievement: 02/16/18 Potential to Achieve Goals: Good    Frequency Min 3X/week   Barriers to discharge        Co-evaluation               AM-PAC PT "6 Clicks" Daily Activity  Outcome Measure Difficulty turning over in bed (including adjusting bedclothes, sheets and blankets)?: A Lot Difficulty moving from lying on back to sitting on the side of the bed? : Unable Difficulty sitting down on and standing up from a chair with arms (e.g., wheelchair, bedside commode, etc,.)?: Unable Help needed moving to and from a bed to chair (including a wheelchair)?: A Little Help needed walking in hospital room?: A Little Help needed climbing 3-5 steps with a railing? : A Lot 6 Click Score: 12    End of Session Equipment Utilized During Treatment: Gait belt Activity Tolerance: Patient tolerated treatment well Patient left: in chair;with call bell/phone within reach;with chair alarm set   PT Visit Diagnosis: Muscle weakness (generalized) (M62.81);Difficulty in walking, not elsewhere classified (R26.2)    Time: 5997-7414 PT Time Calculation (min) (ACUTE ONLY): 17 min   Charges:   PT Evaluation $PT Eval Moderate Complexity: 1 Mod     PT G Codes:          Weston Anna, MPT Pager: [phone]

## 2018-02-02 NOTE — Progress Notes (Signed)
PROGRESS NOTE    Victoria Lloyd  VEH:[id number] DOB: May 27, 1922 DOA: 02/01/2018 PCP: Patient, No Pcp Per   Brief Narrative: Patient is a 84 year old female with past medical history of hypertension, CKD, CHF, complete heart block status post pacemaker, who presented to the emergency department after she fell at home.  It was noted to be a mechanical fall.  Patient was walking from the house when she fell and landed on the carpet.  Patient was brought to the emergency department by  her caretaker.  Patient was also noted to be mildly febrile on presentation.  Multiple imaging  were done on presentation and none of them so acute abnormalities like fracture or dislocation. Patient has been involved with physical therapy today and recommended  skilled nursing facility.    Assessment & Plan:   Principal Problem:   Fall at home, initial encounter Active Problems:   Hypertension   CKD (chronic kidney disease) stage 4, GFR 15-29 ml/min ([place])   Fever  Fall: Patient has a history of falls in the past.  Patient evaluated by physical therapy and recommended skilled nursing facility on discharge. Patient complains of pain on the right shoulder.  Multiple imagings were done in the emergency department on presentation and none of them show acute abnormalities. Continue pain management.  Fever: Noted to be mildly febrile on presentation.  Patient is currently afebrile.  Her white cell counts are stable.  Influenza is negative.  Blood cultures were sent. Noted to have mild elevation of ESR which is nonspecific.UA normal.  Urine culture showed insignificant growth.  History of stroke: Continue aspirin, Lipitor  Hypertension: Currently blood pressure stable.  Continue her home medications verapamil and carvedilol  History of CKD: Currently kidney function is stable.  Anxiety: Continue Xanax.    Family members were extremely anxious today.  1 of her sons is a physician and I talked to him on  phone and reassured that everything is stable at this point.  Vitals are good.  Her labs are good.  They were requesting for repeat CT scan of the head which I do not think is appropriate because she had just had one yesterday. Her mental status is stable upon my evaluation.  DVT prophylaxis: Lovenox Code Status: Full Family Communication: Talked to Son, daughter Disposition Plan: SNF as soon as the bed is available   Consultants: None   Procedures: None   Antimicrobials: None   Subjective: Patient seen and examined the bedside this morning.  Remains comfortable.  Complains of some pain on the right shoulder.  He is hemodynamically stable.  She participated with physical therapy in the morning.  Objective: Vitals:   02/01/18 2150 02/01/18 2214 02/02/18 0523 02/02/18 0904  BP: (!) 147/64 (!) 147/64 (!) 122/52 136/61  Pulse: 60 60 61   Resp: '17 17 18   ' Temp: 98.2 F (36.8 C) 98.2 F (36.8 C) 97.7 F (36.5 C)   TempSrc: Oral Oral Oral   SpO2: 95%  96%   Weight:  60 kg (132 lb 4.4 oz) 59.9 kg (132 lb 0.9 oz)   Height:  '5\' 2"'  (1.575 m)     No intake or output data in the 24 hours ending 02/02/18 1557 Filed Weights   02/01/18 2214 02/02/18 0523  Weight: 60 kg (132 lb 4.4 oz) 59.9 kg (132 lb 0.9 oz)    Examination:  General exam: Appears calm and comfortable ,Not in distress,average built HEENT:PERRL,Oral mucosa moist, Ear/Nose normal on gross exam Respiratory system: Bilateral  equal air entry, normal vesicular breath sounds, no wheezes or crackles  Cardiovascular system: S1 & S2 heard, RRR. No JVD, murmurs, rubs, gallops or clicks. No pedal edema. Gastrointestinal system: Abdomen is nondistended, soft and nontender. No organomegaly or masses felt. Normal bowel sounds heard. Central nervous system: Alert and oriented. No focal neurological deficits. Extremities: No edema, no clubbing ,no cyanosis, distal peripheral pulses palpable. Skin: No rashes, lesions or ulcers,no  icterus ,no pallor MSK: Tenderness on bilateral shoulders, back Psychiatry: Judgement and insight appear normal. Mood & affect appropriate.     Data Reviewed: I have personally reviewed following labs and imaging studies  CBC: Recent Labs  Lab 02/01/18 1327 02/02/18 0520  WBC 8.5 9.4  NEUTROABS 6.2  --   HGB 14.5 14.4  HCT 44.2 44.2  MCV 91.7 92.7  PLT 297 250   Basic Metabolic Panel: Recent Labs  Lab 02/01/18 1500 02/02/18 0520  NA 141 140  K 3.6 3.5  CL 98* 101  CO2 31 26  GLUCOSE 151* 147*  BUN 20 19  CREATININE 1.17* 0.99  CALCIUM 9.8 9.7   GFR: Estimated Creatinine Clearance: 28.1 mL/min (by C-G formula based on SCr of 0.99 mg/dL). Liver Function Tests: Recent Labs  Lab 02/01/18 1500 02/02/18 0520  AST 21 14*  ALT 11* 7*  ALKPHOS 55 54  BILITOT 0.8 0.7  PROT 7.9 7.5  ALBUMIN 3.6 3.4*   No results for input(s): LIPASE, AMYLASE in the last 168 hours. Recent Labs  Lab 02/01/18 1410  AMMONIA 22   Coagulation Profile: No results for input(s): INR, PROTIME in the last 168 hours. Cardiac Enzymes: No results for input(s): CKTOTAL, CKMB, CKMBINDEX, TROPONINI in the last 168 hours. BNP (last 3 results) No results for input(s): PROBNP in the last 8760 hours. HbA1C: No results for input(s): HGBA1C in the last 72 hours. CBG: No results for input(s): GLUCAP in the last 168 hours. Lipid Profile: Recent Labs    02/02/18 0520  CHOL 193  HDL 54  LDLCALC 126*  TRIG 64  CHOLHDL 3.6   Thyroid Function Tests: No results for input(s): TSH, T4TOTAL, FREET4, T3FREE, THYROIDAB in the last 72 hours. Anemia Panel: No results for input(s): VITAMINB12, FOLATE, FERRITIN, TIBC, IRON, RETICCTPCT in the last 72 hours. Sepsis Labs: Recent Labs  Lab 02/01/18 1459  LATICACIDVEN 1.59    Recent Results (from the past 240 hour(s))  Urine culture     Status: Abnormal   Collection Time: 02/01/18  2:40 PM  Result Value Ref Range Status   Specimen Description   Final     URINE, RANDOM Performed at [facility] [address], [place], [place] [address]    Special Requests   Final    NONE Performed at [facility], [place] [address] [place], [place] [address]    Culture (A)  Final    <10,000 COLONIES/mL INSIGNIFICANT GROWTH Performed at [facility] [address] [place], [place] [address]    Report Status 02/02/2018 FINAL  Final  Blood Culture (routine x 2)     Status: None (Preliminary result)   Collection Time: 02/01/18  2:56 PM  Result Value Ref Range Status   Specimen Description   Final    BLOOD BLOOD RIGHT FOREARM Performed at [facility] [address] [place], [place] [address]    Special Requests   Final    BOTTLES DRAWN AEROBIC AND ANAEROBIC Blood Culture adequate volume Performed at [facility], [place] [address] [place], [place] [address]    Culture   Final    NO GROWTH < 24 HOURS Performed at [facility] [address], [place], [place] [address]    Report Status PENDING  Incomplete  Blood Culture (routine x 2)     Status: None (Preliminary result)   Collection Time: 02/01/18  9:24 PM  Result Value Ref Range Status   Specimen Description   Final    BLOOD BLOOD RIGHT HAND Performed at [facility] [address], [place], [place] [address]    Special Requests   Final    IN PEDIATRIC BOTTLE Blood Culture adequate volume Performed at [facility] [address] [place], [place] [address]    Culture   Final    NO GROWTH < 12 HOURS Performed at [facility] [address] [place], [place] [address]    Report Status PENDING  Incomplete         Radiology Studies: Dg Chest 2 View  Result Date: 02/01/2018 CLINICAL DATA:  [place] change.  Recent fall. EXAM: CHEST - 2 VIEW COMPARISON:  06/03/2016 chest radiograph. FINDINGS: Stable configuration of 2 lead left subclavian pacemaker.  Stable cardiomediastinal silhouette with mild cardiomegaly. No pneumothorax. No pleural effusion. Stable moderate elevation of the right hemidiaphragm. No pulmonary edema. No acute consolidative airspace disease. IMPRESSION: No acute cardiopulmonary disease. Stable mild cardiomegaly without pulmonary edema. Stable moderate elevation of the right hemidiaphragm. Electronically Signed   By: Ilona Sorrel M.D.   On: 02/01/2018 15:52   Dg Thoracic Spine 2 View  Result Date: 02/01/2018 CLINICAL DATA:  Fall several weeks ago with back pain, initial encounter EXAM: THORACIC SPINE 2 VIEWS COMPARISON:  04/25/2016 FINDINGS: Vertebral body height is well maintained. Mild osteophytic changes are seen. No paraspinal mass lesion is noted. No definitive rib abnormality is noted. Pacing device is again seen. IMPRESSION: Mild degenerative change without acute abnormality. Electronically Signed   By: Inez Catalina M.D.   On: 02/01/2018 15:55   Dg Lumbar Spine Complete  Result Date: 02/01/2018 CLINICAL DATA:  Chronic back pain. EXAM: LUMBAR SPINE - COMPLETE 4+ VIEW COMPARISON:  Radiographs of Apr 03, 2009. FINDINGS: Stable grade 2 anterolisthesis of L4-5 is noted secondary to posterior facet joint hypertrophy. Mild degenerative disc disease is noted at L2-3 and L4-5. No acute fracture is noted. Atherosclerosis of abdominal aorta is noted. IMPRESSION: Stable grade 2 anterolisthesis of L4-5 is noted secondary to posterior facet joint hypertrophy. Mild multilevel degenerative disc disease is noted. No acute abnormality is noted. Aortic Atherosclerosis (ICD10-I70.0). Electronically Signed   By: Marijo Conception, M.D.   On: 02/01/2018 15:54   Dg Pelvis 1-2 Views  Result Date: 02/01/2018 CLINICAL DATA:  Behavior change.  Recent fall. EXAM: PELVIS - 1-2 VIEW COMPARISON:  01/31/2016 sacrococcygeal radiographs FINDINGS: No acute pelvic fracture. No pelvic diastasis. Healed deformities in the left pubic rami are unchanged since 2017. No  suspicious focal osseous lesions. No evidence of hip dislocation on this single frontal view. Degenerative changes in the visualized lower lumbar spine. IMPRESSION: No acute pelvic fracture. Chronic healed left pubic rami deformities. Electronically Signed   By: Ilona Sorrel M.D.   On: 02/01/2018 15:54   Dg Shoulder Right  Result Date: 02/01/2018 CLINICAL DATA:  Shoulder pain. EXAM: RIGHT SHOULDER - 2+ VIEW COMPARISON:  08/04/2016 FINDINGS: There is no acute fracture or subluxation identified. No radio-opaque foreign body or soft tissue calcification. Moderate degenerative  changes are noted at the acromioclavicular joint. Large calcified mediastinal and hilar lymph nodes identified. IMPRESSION: 1. AC joint osteoarthritis. 2. No acute findings. Electronically Signed   By: Kerby Moors M.D.   On: 02/01/2018 15:54   Ct Head Wo Contrast  Result Date: 02/01/2018 CLINICAL DATA:  Chronic pain.  Patient fell 3 weeks ago. EXAM: CT HEAD WITHOUT CONTRAST TECHNIQUE: Contiguous axial images were obtained from the base of the skull through the vertex without intravenous contrast. COMPARISON:  09/06/2011 FINDINGS: Brain: Encephalomalacia in the right occipital lobe. Chronic cerebral atrophy with mild sulcal and moderate ventricular prominence with chronic small vessel ischemic disease. No intra-axial mass nor extra-axial fluid collections. No acute large vascular territory infarcts. Vascular: Atherosclerosis of the carotid siphons. No hyperdense vessel sign. Skull: No acute skull fracture or suspicious osseous lesions. Sinuses/Orbits: Small right mastoid effusion. No acute sinus disease. Intact orbits and globes. Other: None IMPRESSION: 1. Chronic stable cerebral atrophy with small vessel ischemia. 2. Chronic right PCA distribution infarct with right occipital lobe encephalomalacia. Electronically Signed   By: Ashley Royalty M.D.   On: 02/01/2018 16:01        Scheduled Meds: . acetaminophen  1,000 mg Oral Once  .  ALPRAZolam  0.125 mg Oral QHS  . aspirin EC  81 mg Oral Daily  . carvedilol  12.5 mg Oral BID WC  . cycloSPORINE  1 drop Both Eyes BID  . dicyclomine  10 mg Oral QHS  . enoxaparin (LOVENOX) injection  30 mg Subcutaneous Q24H  . furosemide  20 mg Oral Daily  . sodium chloride flush  3 mL Intravenous Q12H  . verapamil  120 mg Oral QPM   Continuous Infusions: . sodium chloride       LOS: 0 days    Time spent: 25 mins.More than 50% of that time was spent in counseling and/or coordination of care.      Marene Lenz, MD [facility] Pager [phone]  If 7PM-7AM, please contact night-coverage [email or web address] Password [facility] 02/02/2018, 3:57 PM

## 2018-02-02 NOTE — NC FL2 (Signed)
[place] MEDICAID FL2 LEVEL OF CARE SCREENING TOOL     IDENTIFICATION  Patient Name: Victoria Lloyd Birthdate: 1922-02-26 Sex: female Admission Date (Current Location): 02/01/2018  [facility] and [place] Number:  Herbalist and Address:  [facility] [address], [place]      Provider Number: [id number]  Attending Physician Name and Address:  Marene Lenz, MD  Relative Name and Phone Number:       Current Level of Care: Hospital Recommended Level of Care: [place] Prior Approval Number:    Date Approved/Denied:   PASRR Number: [id number]  Discharge Plan: SNF    Current Diagnoses: Patient Active Problem List   Diagnosis Date Noted  . Fever 02/01/2018  . UTI (lower urinary tract infection)   . Anxiety 06/03/2016  . SOB (shortness of breath) 06/03/2016  . Elevated troponin 06/03/2016  . CHF exacerbation ([place]) 06/03/2016  . Acute on chronic diastolic (congestive) heart failure ([place])   . Syncope 04/24/2016  . Pacemaker- St Jude impalnted 08/04/14 08/06/2014  . AV block, 3rd degree ([facility]) 08/03/2014  . Acute CHF- secondary to bradycardia on admision (Nl LVF by echo) 08/03/2014  . CKD (chronic kidney disease) stage 4, GFR 15-29 ml/min ([facility]) 08/03/2014  . Bradycardia 08/03/2014  . Hypertension 03/15/2011    Orientation RESPIRATION BLADDER Height & Weight     Self, Place  Normal Incontinent Weight: 132 lb 0.9 oz (59.9 kg) Height:  5\' 2"  (157.5 cm)  BEHAVIORAL SYMPTOMS/MOOD NEUROLOGICAL BOWEL NUTRITION STATUS      Continent Diet(heart healthy diet)  AMBULATORY STATUS COMMUNICATION OF NEEDS Skin   Extensive Assist Verbally Normal                       Personal Care Assistance Level of Assistance  Bathing, Feeding, Dressing Bathing Assistance: Limited assistance Feeding assistance: Independent Dressing Assistance: Limited assistance     Functional Limitations Info  Sight, Hearing, Speech Sight  Info: Adequate Hearing Info: Adequate Speech Info: Adequate    SPECIAL CARE FACTORS FREQUENCY  PT (By licensed PT), OT (By licensed OT)     PT Frequency: 5x OT Frequency: 5x            Contractures Contractures Info: Not present    Additional Factors Info  Code Status, Allergies Code Status Info: full code Allergies Info: Codeine, Sulfa Antibiotics, Lactose Intolerance (Gi), Milk-related Compounds           Current Medications (02/02/2018):  This is the current hospital active medication list Current Facility-Administered Medications  Medication Dose Route Frequency Provider Last Rate Last Dose  . 0.9 %  sodium chloride infusion  250 mL Intravenous PRN Jani Gravel, MD      . acetaminophen (TYLENOL) tablet 650 mg  650 mg Oral Q6H PRN Jani Gravel, MD   650 mg at 02/02/18 1215   Or  . acetaminophen (TYLENOL) suppository 650 mg  650 mg Rectal Q6H PRN Jani Gravel, MD      . acetaminophen (TYLENOL) tablet 1,000 mg  1,000 mg Oral Once Kinnie Feil, PA-C      . ALPRAZolam [name]) tablet 0.125 mg  0.125 mg Oral QHS Jani Gravel, MD   0.125 mg at 02/01/18 2213  . ALPRAZolam [name]) tablet 0.25 mg  0.25 mg Oral QHS PRN Jani Gravel, MD      . aspirin EC tablet 81 mg  81 mg Oral Daily Jani Gravel, MD   81 mg at  02/02/18 0908  . carvedilol (COREG) tablet 12.5 mg  12.5 mg Oral BID WC Jani Gravel, MD   12.5 mg at 02/02/18 0908  . cycloSPORINE (RESTASIS) 0.05 % ophthalmic emulsion 1 drop  1 drop Both Eyes BID Jani Gravel, MD   1 drop at 02/02/18 0908  . dicyclomine (BENTYL) capsule 10 mg  10 mg Oral QHS Jani Gravel, MD   10 mg at 02/01/18 2212  . enoxaparin (LOVENOX) injection 30 mg  30 mg Subcutaneous Q24H Jani Gravel, MD   30 mg at 02/01/18 2212  . furosemide (LASIX) tablet 20 mg  20 mg Oral Daily Jani Gravel, MD   20 mg at 02/02/18 0908  . sodium chloride flush (NS) 0.9 % injection 3 mL  3 mL Intravenous Q12H Jani Gravel, MD   3 mL at 02/01/18 2213  . sodium chloride flush (NS) 0.9 % injection  3 mL  3 mL Intravenous PRN Jani Gravel, MD      . traMADol Veatrice Bourbon) tablet 50 mg  50 mg Oral Q6H PRN Marene Lenz, MD      . verapamil (CALAN) tablet 120 mg  120 mg Oral QPM Jani Gravel, MD   120 mg at 02/01/18 2212     Discharge Medications: Please see discharge summary for a list of discharge medications.  Relevant Imaging Results:  Relevant Lab Results:   Additional Information SS#: [id number]  Nila Nephew, LCSW

## 2018-02-02 NOTE — Care Management Obs Status (Signed)
[place] NOTIFICATION   Patient Details  Name: HAFSAH HENDLER MRN: [id number] Date of Birth: [birth year]   Medicare Observation Status Notification Given:  Yes    Purcell Mouton, RN 02/02/2018, 2:34 PM

## 2018-02-02 NOTE — Progress Notes (Addendum)
Initial Nutrition Assessment  DOCUMENTATION CODES:   Not applicable  INTERVENTION:    Magic cup TID with meals, each supplement provides 290 kcal and 9 grams of protein  Assistance with meals due to arm pain  NUTRITION DIAGNOSIS:   Inadequate oral intake related to decreased appetite as evidenced by per patient/family report.  GOAL:   Patient will meet greater than or equal to 90% of their needs  MONITOR:   PO intake, Supplement acceptance, Weight trends, Labs  REASON FOR ASSESSMENT:   Malnutrition Screening Tool    ASSESSMENT:   Patient with PMH significant for HTN, CKD IV, CHF, and is s/p pacemaker. Presents this admission after a mechanical fall at home. Admitted for fever.    Spoke with granddaughter at bedside. Reports pt's intake has declined slowly over the years but she has not noticed any significant changes. States pt's appetite is off and on. One day she will only eat a couple bites at meal times and other times she will ask for seconds. Pt typically eats three meals per day prepared by her home health aid.  RD observed breakfast at bedside with eggs and toast 25% completed. Pt does not use supplementation at home. Discussed the use of supplementation for when appetite is off. Granddaughter reports pt has a hard time with milk products. Will trial Magic Cup and monitor for tolerance.   Granddaughter denies that pt had any recent wt loss. Records are limited in weight history but show pt weighed 136 lb 05/21/2017 and 132 lb this admission. Hard to quantify weight loss due to pt's history of CHF. Nutrition-Focused physical exam completed.   Medications reviewed and include: 20 mg lasix Labs reviewed.   NUTRITION - FOCUSED PHYSICAL EXAM:    Most Recent Value  Orbital Region  No depletion  Upper Arm Region  No depletion  Thoracic and Lumbar Region  Unable to assess  Buccal Region  Mild depletion  Temple Region  Moderate depletion  Clavicle Bone Region  Moderate  depletion  Clavicle and Acromion Bone Region  Moderate depletion  Scapular Bone Region  Unable to assess  Dorsal Hand  Mild depletion  Patellar Region  No depletion  Anterior Thigh Region  No depletion  Posterior Calf Region  No depletion  Edema (RD Assessment)  Mild     Diet Order:  Diet Heart Room service appropriate? Yes; Fluid consistency: Thin  EDUCATION NEEDS:   Education needs have been addressed  Skin:  Skin Assessment: Reviewed RN Assessment  Last BM:  PTA  Height:   Ht Readings from Last 1 Encounters:  02/01/18 5\' 2"  (1.575 m)    Weight:   Wt Readings from Last 1 Encounters:  02/02/18 132 lb 0.9 oz (59.9 kg)    Ideal Body Weight:  50 kg  BMI:  Body mass index is 24.15 kg/m.  Estimated Nutritional Needs:   Kcal:  1500-1700 kcal/day  Protein:  65-75 g/day  Fluid:  >1.5 L/day    Mariana Single RD, LDN Clinical Nutrition Pager # [phone]

## 2018-02-03 DIAGNOSIS — M25511 Pain in right shoulder: Principal | ICD-10-CM

## 2018-02-03 DIAGNOSIS — Y92009 Unspecified place in unspecified non-institutional (private) residence as the place of occurrence of the external cause: Secondary | ICD-10-CM

## 2018-02-03 DIAGNOSIS — R509 Fever, unspecified: Secondary | ICD-10-CM | POA: Diagnosis not present

## 2018-02-03 DIAGNOSIS — N184 Chronic kidney disease, stage 4 (severe): Secondary | ICD-10-CM | POA: Diagnosis not present

## 2018-02-03 DIAGNOSIS — W19XXXA Unspecified fall, initial encounter: Secondary | ICD-10-CM | POA: Diagnosis not present

## 2018-02-03 DIAGNOSIS — I10 Essential (primary) hypertension: Secondary | ICD-10-CM | POA: Diagnosis not present

## 2018-02-03 LAB — CBC WITH DIFFERENTIAL/PLATELET
Basophils Absolute: 0 10*3/uL (ref 0.0–0.1)
Basophils Relative: 0 %
EOS ABS: 0.1 10*3/uL (ref 0.0–0.7)
EOS PCT: 1 %
HCT: 43 % (ref 36.0–46.0)
Hemoglobin: 14.2 g/dL (ref 12.0–15.0)
LYMPHS ABS: 1.5 10*3/uL (ref 0.7–4.0)
Lymphocytes Relative: 17 %
MCH: 30.3 pg (ref 26.0–34.0)
MCHC: 33 g/dL (ref 30.0–36.0)
MCV: 91.9 fL (ref 78.0–100.0)
MONO ABS: 1 10*3/uL (ref 0.1–1.0)
Monocytes Relative: 12 %
Neutro Abs: 5.9 10*3/uL (ref 1.7–7.7)
Neutrophils Relative %: 70 %
PLATELETS: 312 10*3/uL (ref 150–400)
RBC: 4.68 MIL/uL (ref 3.87–5.11)
RDW: 14.2 % (ref 11.5–15.5)
WBC: 8.4 10*3/uL (ref 4.0–10.5)

## 2018-02-03 LAB — PHOSPHORUS: Phosphorus: 2.5 mg/dL (ref 2.5–4.6)

## 2018-02-03 LAB — COMPREHENSIVE METABOLIC PANEL
ALT: 9 U/L — AB (ref 14–54)
ANION GAP: 12 (ref 5–15)
AST: 15 U/L (ref 15–41)
Albumin: 3.4 g/dL — ABNORMAL LOW (ref 3.5–5.0)
Alkaline Phosphatase: 55 U/L (ref 38–126)
BUN: 21 mg/dL — ABNORMAL HIGH (ref 6–20)
CHLORIDE: 98 mmol/L — AB (ref 101–111)
CO2: 30 mmol/L (ref 22–32)
Calcium: 9.2 mg/dL (ref 8.9–10.3)
Creatinine, Ser: 1 mg/dL (ref 0.44–1.00)
GFR, EST AFRICAN AMERICAN: 55 mL/min — AB (ref >60)
GFR, EST NON AFRICAN AMERICAN: 47 mL/min — AB (ref >60)
Glucose, Bld: 131 mg/dL — ABNORMAL HIGH (ref 65–99)
POTASSIUM: 3 mmol/L — AB (ref 3.5–5.1)
SODIUM: 140 mmol/L (ref 135–145)
Total Bilirubin: 0.7 mg/dL (ref 0.3–1.2)
Total Protein: 7.6 g/dL (ref 6.5–8.1)

## 2018-02-03 LAB — MAGNESIUM: MAGNESIUM: 1.8 mg/dL (ref 1.7–2.4)

## 2018-02-03 MED ORDER — ALPRAZOLAM 0.25 MG PO TABS: 0.1250 mg | ORAL_TABLET | Freq: Every day | ORAL | 0 refills | Status: DC

## 2018-02-03 MED ORDER — DICLOFENAC SODIUM 1 % TD GEL: 2.0000 g | Freq: Four times a day (QID) | TRANSDERMAL | 0 refills | Status: DC

## 2018-02-03 MED ORDER — POTASSIUM CHLORIDE 10 MEQ/100ML IV SOLN
10.0000 meq | INTRAVENOUS | Status: AC
  Administered 2018-02-03 (×4): 10 meq via INTRAVENOUS
  Filled 2018-02-03 (×3): qty 100

## 2018-02-03 MED ORDER — POTASSIUM CHLORIDE CRYS ER 20 MEQ PO TBCR
40.0000 meq | EXTENDED_RELEASE_TABLET | Freq: Once | ORAL | Status: AC
  Administered 2018-02-03: 40 meq via ORAL
  Filled 2018-02-03: qty 2

## 2018-02-03 MED ORDER — TRAMADOL HCL 50 MG PO TABS: 50.0000 mg | ORAL_TABLET | Freq: Four times a day (QID) | ORAL | 0 refills | Status: DC | PRN

## 2018-02-03 MED ORDER — DICLOFENAC SODIUM 1 % TD GEL
2.0000 g | Freq: Four times a day (QID) | TRANSDERMAL | Status: DC
  Administered 2018-02-03 (×2): 2 g via TOPICAL
  Filled 2018-02-03: qty 100

## 2018-02-03 NOTE — Clinical Social Work Placement (Signed)
Pt discharged and will admit to [facility] room 1206P- report [phone]  Pt's son spoke with CSW via phone- aware and agreeable to plan. DC summary provided via the [place] transport arranged  See below for placement details   CLINICAL SOCIAL WORK PLACEMENT  NOTE  Date:  02/03/2018  Patient Details  Name: Victoria Lloyd MRN: [id number] Date of Birth: [birth year]  Clinical Social Work is seeking post-discharge placement for this patient at the [place] level of care (*CSW will initial, date and re-position this form in  chart as items are completed):  Yes   Patient/family provided with [place] Work Department's list of facilities offering this level of care within the geographic area requested by the patient (or if unable, by the patient's family).  Yes   Patient/family informed of their freedom to choose among providers that offer the needed level of care, that participate in Medicare, Medicaid or managed care program needed by the patient, have an available bed and are willing to accept the patient.  Yes   Patient/family informed of [facility]'s ownership interest in [facility] and [facility], as well as of the fact that they are under no obligation to receive care at these facilities.  PASRR submitted to EDS on       PASRR number received on       Existing PASRR number confirmed on 02/02/18     FL2 transmitted to all facilities in geographic area requested by pt/family on 02/02/18     FL2 transmitted to all facilities within larger geographic area on       Patient informed that his/her managed care company has contracts with or will negotiate with certain facilities, including the following:  [employer]     Yes   Patient/family informed of bed offers received.  Patient chooses bed at [facility]     Physician recommends and patient chooses bed at [facility]    Patient to be transferred to [facility] on  02/03/18.  Patient to be transferred to facility by [facility]     Patient family notified on 02/03/18 of transfer.  Name of family member notified:  son [name], caregiver [facility]     PHYSICIAN       Additional Comment:    _______________________________________________ Nila Nephew, LCSW 02/03/2018, 2:58 PM

## 2018-02-03 NOTE — Progress Notes (Signed)
Pt discharge to  [facility], spoke with Ngoi. RN. Corey Harold called waiting for arrival. SRP< RN

## 2018-02-03 NOTE — Clinical Social Work Note (Signed)
Clinical Social Work Assessment  Patient Details  Name: Victoria Lloyd MRN: [id number] Date of Birth: 14-Dec-1921  Date of referral:  02/03/18               Reason for consult:  Facility Placement                Permission sought to share information with:  Family Supports Permission granted to share information::  Yes, Verbal Permission Granted  Name::     son [name], daughter  Agency::     Relationship::     Contact Information:     Housing/Transportation Living arrangements for the past 2 months:  Single Family Home Source of Information:  Adult Children, Medical Team Patient Interpreter Needed:  None Criminal Activity/Legal Involvement Pertinent to Current Situation/Hospitalization:  No - Comment as needed Significant Relationships:  Adult Children, Community Support Lives with:  Self Do you feel safe going back to the place where you live?  No Need for family participation in patient care:  Yes (Comment)(pt not oriented to situation)  Care giving concerns:  Pt lives at home alone, has private duty caregiver to aid with her ADLs she ambulates with a walker and assistance.  Son reports her mental status has declined over the years but she is typically able to understand basics of situation, recognize caregivers, have meaningful conversation. Currently oriented to person but able to respond appropriately to some questioning. Son shares that pt sustained fall at home, and not able to ambulate at her baseline. "Want to get her the chance to rehab some and come home." Per hx family looked into ALF placement last year however pt prefers living at home with caregivers   Social Worker assessment / plan:  CSW consulted to assist with SNF placement for ST rehab. Pt has been referred to area SNFs and awaiting bed offers- son prefers [facility] (pt was there in 2017) or [facility]. Pt referred to other area facilities as well and CSW is investigating bed availability. Gathered brief hx and care  needs described above. Pt not able to provide much information (drowsy, not oriented to situation), but son participated via phone (is physician out of state) and was very knowledgeable re: care needs and goals of ST rehab. Plan: DC to SNF for ST rehab.   Employment status:  Retired Nurse, adult PT Recommendations:  [place] / Referral to community resources:  [place]  Patient/Family's Response to care:  Son very Patent attorney, only frustrated re: his not being in area and having to communicate via phone with staff rather than in per  Patient/Family's Understanding of and Emotional Response to Diagnosis, Current Treatment, and Prognosis:  Pt unable to demonstrate understanding but son very informed and reasonable in expectations for DC plan. States, "We worry about her going to a rehab bc she can be irritable with people, that's why she's at home, but we want her to get the chance to rehab."  Emotional Assessment Appearance:  Appears stated age Attitude/Demeanor/Rapport:  (sleeping) Affect (typically observed):  Calm Orientation:  Oriented to Self Alcohol / Substance use:  Not Applicable Psych involvement (Current and /or in the community):  No (Comment)  Discharge Needs  Concerns to be addressed:  Decision making concerns, Care Coordination, Discharge Planning Concerns Readmission within the last 30 days:  No Current discharge risk:  Dependent with Mobility, Lives alone Barriers to Discharge:  Insurance Authorization   Nila Nephew, [place] 02/03/2018, 11:21 AM  [phone]

## 2018-02-03 NOTE — Discharge Summary (Signed)
Physician Discharge Summary  Victoria Lloyd UYQ:[id number] DOB: April 27, 1922 DOA: 02/01/2018  PCP: Patient, No Pcp Per  Admit date: 02/01/2018 Discharge date: 02/03/2018  Admitted From: Home Disposition:  SNF  Recommendations for Outpatient Follow-up:  1. Follow up with PCP in 1-2 weeks 2. Please obtain CMP/CBC, Mag, Phos in one week  Home Health: No Equipment/Devices: None recommended by PT  Discharge Condition: Stable  CODE STATUS: FULL CODE Diet recommendation: Heart Healthy Diet   Brief/Interim Summary: The patient is a 84 year old female with past medical history of hypertension, CKD, CHF, complete heart block status post pacemaker, who presented to the emergency department after she fell at home.  It was noted to be a mechanical fall.  Patient was walking from the house when she fell and landed on the carpet.  Patient was brought to the emergency department by her caretaker.  Patient was also noted to be mildly febrile on presentation. Multiple imaging  were done on presentation and none of them so acute abnormalities like fracture or dislocation. Pain was improved with Analgesics. Patient has been involved with physical therapy and recommended  skilled nursing facility. She was deemed medically stable to D/C to SNF and follow up with PCP as an outpatient.   Discharge Diagnoses:  Principal Problem:   Fall at home, initial encounter Active Problems:   Hypertension   CKD (chronic kidney disease) stage 4, GFR 15-29 ml/min (HCC)   Fever  Fall with associated Pain -Patient has a history of falls in the past.  Patient evaluated by physical therapy and recommended skilled nursing facility on discharge. -Patient complains of pain on the right shoulder and back pain.  Multiple imagings were done in the emergency department on presentation and none of them show acute abnormalities. -Continue pain management with Tramadol, Acetaminophen, Lidocaine Patch and Diclofenac   Fever,  improved -Noted to be mildly febrile on presentation.  Patient is currently afebrile.   -Her white cell counts are stable and WBC is 8.4.   -Influenza is negative.  Blood cultures were sent and showed NGTD <24 hours. -Lactic Acid Normal  -Noted to have  elevation of ESR which is nonspecific. -UA normal.  Urine culture showed insignificant growth.  History of Stroke -CT Head showed Chronic stable cerebral atrophy with small vessel ischemia. Chronic right PCA distribution infarct with right occipital lobe encephalomalacia. -Continue Aspirin  Hypertension -Currently blood pressure stable.  -Continue her home medications verapamil and carvedilol  History of CKD -Currently kidney function is stable and Cr is 1.00 -Continue to Monitor and repeat CMP as an outpatient   Anxiety:  -Continue Xanax.  Hypokalemia -Patient's K+ was 3.0 -Replete with IV KCl and po KCl -Continue to Monitor and Replete as Necessary -Repeat CMP at SNF  Hyperlipidemia -Elevated LDL -Not on any Statin anymore  -Follow up with PCP   Discharge Instructions  Discharge Instructions    Call MD for:  difficulty breathing, headache or visual disturbances   Complete by:  As directed    Call MD for:  extreme fatigue   Complete by:  As directed    Call MD for:  hives   Complete by:  As directed    Call MD for:  persistant dizziness or light-headedness   Complete by:  As directed    Call MD for:  persistant nausea and vomiting   Complete by:  As directed    Call MD for:  redness, tenderness, or signs of infection (pain, swelling, redness, odor or green/yellow discharge around  incision site)   Complete by:  As directed    Call MD for:  severe uncontrolled pain   Complete by:  As directed    Call MD for:  temperature >100.4   Complete by:  As directed    Diet - low sodium heart healthy   Complete by:  As directed    Discharge instructions   Complete by:  As directed    Follow up Care at SNF. Take all  medciations as prescribed. If symptoms change or worsen please return to the ED for evaluation.   Increase activity slowly   Complete by:  As directed      Allergies as of 02/03/2018      Reactions   Codeine Other (See Comments)   Unknown; patient cannot recall the reaction   Sulfa Antibiotics Other (See Comments)   Unknown; patient cannot recall the reaction   Lactose Intolerance (gi) Nausea And Vomiting   Milk-related Compounds Nausea And Vomiting      Medication List    TAKE these medications   acetaminophen 325 MG tablet Commonly known as:  TYLENOL Take 1-2 tablets (325-650 mg total) by mouth every 4 (four) hours as needed for mild pain.   ALPRAZolam 0.25 MG tablet Commonly known as:  XANAX Take 0.5 tablets (0.125 mg total) by mouth at bedtime. What changed:  Another medication with the same name was removed. Continue taking this medication, and follow the directions you see here.   aspirin 81 MG tablet Take 81 mg by mouth daily.   calcium-vitamin D 500-200 MG-UNIT tablet Commonly known as:  OSCAL WITH D Take 1 tablet by mouth daily.   carvedilol 12.5 MG tablet Commonly known as:  COREG Take 12.5 mg by mouth 2 (two) times daily with a meal.   cycloSPORINE 0.05 % ophthalmic emulsion Commonly known as:  RESTASIS 1 drop 2 (two) times daily. Both eyes: 1 morning, 1 nightly, PRN for dry-eye syndrome   dextromethorphan-guaiFENesin 30-600 MG 12hr tablet Commonly known as:  MUCINEX DM Take 1 tablet by mouth 2 (two) times daily.   diclofenac sodium 1 % Gel Commonly known as:  VOLTAREN Apply 2 g topically 4 (four) times daily.   dicyclomine 10 MG capsule Commonly known as:  BENTYL Take 10 mg by mouth at bedtime.   furosemide 20 MG tablet Commonly known as:  LASIX Take 1 tablet (20 mg total) by mouth daily.   lidocaine 5 % Commonly known as:  LIDODERM Place 1 patch onto the skin daily.   ondansetron 4 MG disintegrating tablet Commonly known as:  ZOFRAN-ODT Take  1 tablet (4 mg total) by mouth every 8 (eight) hours as needed for nausea or vomiting.   senna 8.6 MG Tabs tablet Commonly known as:  SENOKOT Take 2 tablets (17.2 mg total) by mouth daily.   traMADol 50 MG tablet Commonly known as:  ULTRAM Take 1 tablet (50 mg total) by mouth every 6 (six) hours as needed for severe pain.   verapamil 180 MG CR tablet Commonly known as:  CALAN-SR TK 1 T PO QD What changed:  Another medication with the same name was removed. Continue taking this medication, and follow the directions you see here.       Allergies  Allergen Reactions  . Codeine Other (See Comments)    Unknown; patient cannot recall the reaction  . Sulfa Antibiotics Other (See Comments)    Unknown; patient cannot recall the reaction  . Lactose Intolerance (Gi) Nausea And Vomiting  .  Milk-Related Compounds Nausea And Vomiting   Consultations:  None  Procedures/Studies: Dg Chest 2 View  Result Date: 02/01/2018 CLINICAL DATA:  [place] change.  Recent fall. EXAM: CHEST - 2 VIEW COMPARISON:  06/03/2016 chest radiograph. FINDINGS: Stable configuration of 2 lead left subclavian pacemaker. Stable cardiomediastinal silhouette with mild cardiomegaly. No pneumothorax. No pleural effusion. Stable moderate elevation of the right hemidiaphragm. No pulmonary edema. No acute consolidative airspace disease. IMPRESSION: No acute cardiopulmonary disease. Stable mild cardiomegaly without pulmonary edema. Stable moderate elevation of the right hemidiaphragm. Electronically Signed   By: Ilona Sorrel M.D.   On: 02/01/2018 15:52   Dg Thoracic Spine 2 View  Result Date: 02/01/2018 CLINICAL DATA:  Fall several weeks ago with back pain, initial encounter EXAM: THORACIC SPINE 2 VIEWS COMPARISON:  04/25/2016 FINDINGS: Vertebral body height is well maintained. Mild osteophytic changes are seen. No paraspinal mass lesion is noted. No definitive rib abnormality is noted. Pacing device is again seen. IMPRESSION:  Mild degenerative change without acute abnormality. Electronically Signed   By: Inez Catalina M.D.   On: 02/01/2018 15:55   Dg Lumbar Spine Complete  Result Date: 02/01/2018 CLINICAL DATA:  Chronic back pain. EXAM: LUMBAR SPINE - COMPLETE 4+ VIEW COMPARISON:  Radiographs of Apr 03, 2009. FINDINGS: Stable grade 2 anterolisthesis of L4-5 is noted secondary to posterior facet joint hypertrophy. Mild degenerative disc disease is noted at L2-3 and L4-5. No acute fracture is noted. Atherosclerosis of abdominal aorta is noted. IMPRESSION: Stable grade 2 anterolisthesis of L4-5 is noted secondary to posterior facet joint hypertrophy. Mild multilevel degenerative disc disease is noted. No acute abnormality is noted. Aortic Atherosclerosis (ICD10-I70.0). Electronically Signed   By: Marijo Conception, M.D.   On: 02/01/2018 15:54   Dg Pelvis 1-2 Views  Result Date: 02/01/2018 CLINICAL DATA:  Behavior change.  Recent fall. EXAM: PELVIS - 1-2 VIEW COMPARISON:  01/31/2016 sacrococcygeal radiographs FINDINGS: No acute pelvic fracture. No pelvic diastasis. Healed deformities in the left pubic rami are unchanged since 2017. No suspicious focal osseous lesions. No evidence of hip dislocation on this single frontal view. Degenerative changes in the visualized lower lumbar spine. IMPRESSION: No acute pelvic fracture. Chronic healed left pubic rami deformities. Electronically Signed   By: Ilona Sorrel M.D.   On: 02/01/2018 15:54   Dg Shoulder Right  Result Date: 02/01/2018 CLINICAL DATA:  Shoulder pain. EXAM: RIGHT SHOULDER - 2+ VIEW COMPARISON:  08/04/2016 FINDINGS: There is no acute fracture or subluxation identified. No radio-opaque foreign body or soft tissue calcification. Moderate degenerative changes are noted at the acromioclavicular joint. Large calcified mediastinal and hilar lymph nodes identified. IMPRESSION: 1. AC joint osteoarthritis. 2. No acute findings. Electronically Signed   By: Kerby Moors M.D.   On:  02/01/2018 15:54   Ct Head Wo Contrast  Result Date: 02/01/2018 CLINICAL DATA:  Chronic pain.  Patient fell 3 weeks ago. EXAM: CT HEAD WITHOUT CONTRAST TECHNIQUE: Contiguous axial images were obtained from the base of the skull through the vertex without intravenous contrast. COMPARISON:  09/06/2011 FINDINGS: Brain: Encephalomalacia in the right occipital lobe. Chronic cerebral atrophy with mild sulcal and moderate ventricular prominence with chronic small vessel ischemic disease. No intra-axial mass nor extra-axial fluid collections. No acute large vascular territory infarcts. Vascular: Atherosclerosis of the carotid siphons. No hyperdense vessel sign. Skull: No acute skull fracture or suspicious osseous lesions. Sinuses/Orbits: Small right mastoid effusion. No acute sinus disease. Intact orbits and globes. Other: None IMPRESSION: 1. Chronic stable cerebral atrophy with small  vessel ischemia. 2. Chronic right PCA distribution infarct with right occipital lobe encephalomalacia. Electronically Signed   By: Ashley Royalty M.D.   On: 02/01/2018 16:01    Subjective: Seen and examined at bedside and was a little agitated. No complaints. Updated Son Dr. Dorothea Glassman over the phone about discharge plan and he was agreeable.   Discharge Exam: Vitals:   02/03/18 0453 02/03/18 0932  BP: 139/73 (!) 136/56  Pulse: 60 61  Resp: 18   Temp: 98.1 F (36.7 C)   SpO2: 95%    Vitals:   02/02/18 0904 02/02/18 2119 02/03/18 0453 02/03/18 0932  BP: 136/61 125/64 139/73 (!) 136/56  Pulse:  (!) 56 60 61  Resp:  18 18   Temp:  98.3 F (36.8 C) 98.1 F (36.7 C)   TempSrc:  Oral Oral   SpO2:  94% 95%   Weight:   59.4 kg (130 lb 15.3 oz)   Height:       General: Pt is awake, not in acute distress Cardiovascular: RRR, S1/S2 +, no rubs, no gallops Respiratory: Diminished bilaterally, no wheezing, no rhonchi Abdominal: Soft, NT, ND, bowel sounds + Extremities: Mild edema, no cyanosis  The results of significant  diagnostics from this hospitalization (including imaging, microbiology, ancillary and laboratory) are listed below for reference.    Microbiology: Recent Results (from the past 240 hour(s))  Urine culture     Status: Abnormal   Collection Time: 02/01/18  2:40 PM  Result Value Ref Range Status   Specimen Description   Final    URINE, RANDOM Performed at [facility] [address] [place], [place] [address]    Special Requests   Final    NONE Performed at [facility], [place] [address] [place], [place] [address]    Culture (A)  Final    <10,000 COLONIES/mL INSIGNIFICANT GROWTH Performed at [facility] [address] [place], [place] [address]    Report Status 02/02/2018 FINAL  Final  Blood Culture (routine x 2)     Status: None (Preliminary result)   Collection Time: 02/01/18  2:56 PM  Result Value Ref Range Status   Specimen Description   Final    BLOOD BLOOD RIGHT FOREARM Performed at [facility] [address], [place], [place] [address]    Special Requests   Final    BOTTLES DRAWN AEROBIC AND ANAEROBIC Blood Culture adequate volume Performed at [facility] [address], [place], [place] [address]    Culture   Final    NO GROWTH < 24 HOURS Performed at [facility] [address], [place], [place] [address]    Report Status PENDING  Incomplete  Blood Culture (routine x 2)     Status: None (Preliminary result)   Collection Time: 02/01/18  9:24 PM  Result Value Ref Range Status   Specimen Description   Final    BLOOD BLOOD RIGHT HAND Performed at [facility] [address] [place], [place] [address]    Special Requests   Final    IN PEDIATRIC BOTTLE Blood Culture adequate volume Performed at [facility] [address] [place], [place] [address]    Culture   Final    NO GROWTH < 12 HOURS Performed at [facility] [address], [place], [place] [address]    Report Status PENDING  Incomplete    Labs: BNP (last 3 results) No  results for input(s): BNP in the last 8760 hours. Basic Metabolic Panel: Recent Labs  Lab 02/01/18 1500 02/02/18 0520 02/03/18 0951  NA 141 140 140  K 3.6 3.5 3.0*  CL 98* 101 98*  CO2 _0 GLUCOSE 151* 147* 131*  BUN 20 19 21*  CREATININE 1.17* 0.99 1.00  CALCIUM 9.8 9.7 9.2  MG  --   --  1.8  PHOS  --   --  2.5   Liver Function Tests: Recent Labs  Lab 02/01/18 1500 02/02/18 0520 02/03/18 0951  AST 21 14* 15  ALT 11* 7* 9*  ALKPHOS 55 54 55  BILITOT 0.8 0.7 0.7  PROT 7.9 7.5 7.6  ALBUMIN 3.6 3.4* 3.4*   No results for input(s): LIPASE, AMYLASE in the last 168 hours. Recent Labs  Lab 02/01/18 1410  AMMONIA 22   CBC: Recent Labs  Lab 02/01/18 1327 02/02/18 0520 02/03/18 0951  WBC 8.5 9.4 8.4  NEUTROABS 6.2  --  5.9  HGB 14.5 14.4 14.2  HCT 44.2 44.2 43.0  MCV 91.7 92.7 91.9  PLT 297 278 312   Cardiac Enzymes: No results for input(s): CKTOTAL, CKMB, CKMBINDEX, TROPONINI in the last 168 hours. BNP: Invalid input(s): POCBNP CBG: No results for input(s): GLUCAP in the last 168 hours. D-Dimer No results for input(s): DDIMER in the last 72 hours. Hgb A1c No results for input(s): HGBA1C in the last 72 hours. Lipid Profile Recent Labs    02/02/18 0520  CHOL 193  HDL 54  LDLCALC 126*  TRIG 64  CHOLHDL 3.6   Thyroid function studies No results for input(s): TSH, T4TOTAL, T3FREE, THYROIDAB in the last 72 hours.  Invalid input(s): FREET3 Anemia work up No results for input(s): VITAMINB12, FOLATE, FERRITIN, TIBC, IRON, RETICCTPCT in the last 72 hours. Urinalysis    Component Value Date/Time   COLORURINE YELLOW 02/01/2018 1440   APPEARANCEUR CLEAR 02/01/2018 1440   LABSPEC 1.010 02/01/2018 1440   PHURINE 7.0 02/01/2018 1440   GLUCOSEU NEGATIVE 02/01/2018 1440   HGBUR NEGATIVE 02/01/2018 1440   BILIRUBINUR NEGATIVE 02/01/2018 1440    KETONESUR NEGATIVE 02/01/2018 1440   PROTEINUR NEGATIVE 02/01/2018 1440   UROBILINOGEN 0.2 07/04/2009 1910   NITRITE NEGATIVE 02/01/2018 1440   LEUKOCYTESUR NEGATIVE 02/01/2018 1440   Sepsis Labs Invalid input(s): PROCALCITONIN,  WBC,  LACTICIDVEN Microbiology Recent Results (from the past 240 hour(s))  Urine culture     Status: Abnormal   Collection Time: 02/01/18  2:40 PM  Result Value Ref Range Status   Specimen Description   Final    URINE, RANDOM Performed at [facility], [place] [address] [place], [place] [address]    Special Requests   Final    NONE Performed at [facility], [place] [address] [place], [place] [address]    Culture (A)  Final    <10,000 COLONIES/mL INSIGNIFICANT GROWTH Performed at [facility] [address] [place], [place] [address]    Report Status 02/02/2018 FINAL  Final  Blood Culture (routine x 2)     Status: None (Preliminary result)   Collection Time: 02/01/18  2:56 PM  Result Value Ref Range Status   Specimen Description   Final    BLOOD BLOOD RIGHT FOREARM Performed at [facility] [address] [place],  [address]    Special Requests   Final    BOTTLES DRAWN AEROBIC AND ANAEROBIC Blood Culture adequate volume Performed at [facility],  [place] [address], [place], [place] [address]    Culture   Final    NO GROWTH < 24 HOURS Performed at [facility] [address], [place], [place] [address]    Report Status PENDING  Incomplete  Blood Culture (routine x 2)     Status: None (Preliminary result)   Collection Time: 02/01/18  9:24 PM  Result Value Ref Range Status   Specimen Description   Final    BLOOD BLOOD RIGHT HAND Performed at [facility] [address] [place], [place] [address]    Special Requests   Final    IN PEDIATRIC BOTTLE Blood Culture adequate volume Performed at [facility]  [address] [place], [place] [address]    Culture   Final    NO GROWTH < 12 HOURS Performed at [facility] [address], [place], [place] [address]    Report Status PENDING  Incomplete   Time coordinating discharge: 35 minutes  SIGNED:  Kerney Elbe, DO [facility] 02/03/2018, 12:19 PM Pager [phone]  If 7PM-7AM, please contact night-coverage [email or web address] Password [email or web address]

## 2018-02-06 LAB — CULTURE, BLOOD (ROUTINE X 2)
CULTURE: NO GROWTH
Culture: NO GROWTH
SPECIAL REQUESTS: ADEQUATE
Special Requests: ADEQUATE

## 2018-05-20 ENCOUNTER — Encounter: Payer: Self-pay | Admitting: Nurse Practitioner

## 2018-05-27 NOTE — Progress Notes (Signed)
Electrophysiology Office Note Date: 06/02/2018  ID:  Victoria Lloyd, DOB [birth year], MRN [id number]  PCP: Patient, No Pcp Per Electrophysiologist: Caryl Comes  CC: Pacemaker follow-up  Victoria Lloyd is a 84 y.o. female seen today for Dr Caryl Comes.  She presents today for routine electrophysiology followup.  Since last being seen in our clinic, the patient reports doing reasonably well.  She is now living in a memory care unit.  She is seen with her son today.  She denies chest pain, palpitations, dyspnea, PND, orthopnea, nausea, vomiting, dizziness, syncope, edema, weight gain, or early satiety.  Device History: MDT dual chamber PPM implanted 2016 for 2:1 heart block    Past Medical History:  Diagnosis Date  . Chronic diastolic (congestive) heart failure ([place])   . CKD (chronic kidney disease), stage IV ([place])   . Complete heart block (HCC)    a. s/p MDT dual chamber PPM   . Hypertension   . Stroke ([place]) 02/01/2018   CT scan =>  Chronic right PCA distribution infarct with right occipital lobe   Past Surgical History:  Procedure Laterality Date  . CATARACT EXTRACTION    . PERMANENT PACEMAKER INSERTION N/A 08/04/2014   Procedure: PERMANENT PACEMAKER INSERTION;  Surgeon: Deboraha Sprang, MD;  Location: [facility] CATH LAB;  Service: Cardiovascular;  Laterality: N/A;    Current Outpatient Medications  Medication Sig Dispense Refill  . acetaminophen (TYLENOL) 325 MG tablet Take 1-2 tablets (325-650 mg total) by mouth every 4 (four) hours as needed for mild pain. (Patient not taking: Reported on 02/01/2018)    . ALPRAZolam (XANAX) 0.25 MG tablet Take 0.5 tablets (0.125 mg total) by mouth at bedtime. 10 tablet 0  . aspirin 81 MG tablet Take 81 mg by mouth daily.      . calcium-vitamin D (OSCAL WITH D) 500-200 MG-UNIT per tablet Take 1 tablet by mouth daily.      . carvedilol (COREG) 12.5 MG tablet Take 12.5 mg by mouth 2 (two) times daily with a meal.    . cycloSPORINE (RESTASIS) 0.05 %  ophthalmic emulsion 1 drop 2 (two) times daily. Both eyes: 1 morning, 1 nightly, PRN for dry-eye syndrome    . dextromethorphan-guaiFENesin (MUCINEX DM) 30-600 MG 12hr tablet Take 1 tablet by mouth 2 (two) times daily. (Patient not taking: Reported on 02/01/2018) 14 tablet 0  . diclofenac sodium (VOLTAREN) 1 % GEL Apply 2 g topically 4 (four) times daily. 1 Tube 0  . dicyclomine (BENTYL) 10 MG capsule Take 10 mg by mouth at bedtime.   1  . furosemide (LASIX) 20 MG tablet Take 1 tablet (20 mg total) by mouth daily. 30 tablet 0  . lidocaine (LIDODERM) 5 % Place 1 patch onto the skin daily. (Patient not taking: Reported on 02/01/2018) 30 patch 0  . ondansetron (ZOFRAN-ODT) 4 MG disintegrating tablet Take 1 tablet (4 mg total) by mouth every 8 (eight) hours as needed for nausea or vomiting. (Patient not taking: Reported on 02/01/2018) 20 tablet 0  . QUEtiapine (SEROQUEL) 25 MG tablet Take 25 mg by mouth at bedtime.    . senna (SENOKOT) 8.6 MG TABS tablet Take 2 tablets (17.2 mg total) by mouth daily. (Patient not taking: Reported on 02/01/2018) 120 each 0  . traMADol (ULTRAM) 50 MG tablet Take 1 tablet (50 mg total) by mouth every 6 (six) hours as needed for severe pain. 10 tablet 0  . verapamil (CALAN-SR) 180 MG CR tablet TK 1 T PO QD  4  No current facility-administered medications for this visit.     Allergies:   Codeine; Sulfa antibiotics; Lactose intolerance (gi); and Milk-related compounds   Social History: Social History   Socioeconomic History  . Marital status: Widowed    Spouse name: Not on file  . Number of children: Not on file  . Years of education: Not on file  . Highest education level: Not on file  Occupational History  . Not on file  Social Needs  . Financial resource strain: Not on file  . Food insecurity:    Worry: Not on file    Inability: Not on file  . Transportation needs:    Medical: Not on file    Non-medical: Not on file  Tobacco Use  . Smoking status: Never  Smoker  . Smokeless tobacco: Never Used  Substance and Sexual Activity  . Alcohol use: No  . Drug use: No  . Sexual activity: Not on file  Lifestyle  . Physical activity:    Days per week: Not on file    Minutes per session: Not on file  . Stress: Not on file  Relationships  . Social connections:    Talks on phone: Not on file    Gets together: Not on file    Attends religious service: Not on file    Active member of club or organization: Not on file    Attends meetings of clubs or organizations: Not on file    Relationship status: Not on file  . Intimate partner violence:    Fear of current or ex partner: Not on file    Emotionally abused: Not on file    Physically abused: Not on file    Forced sexual activity: Not on file  Other Topics Concern  . Not on file  Social History Narrative  . Not on file    Family History: Family History  Problem Relation Age of Onset  . Hypertension Mother   . Heart attack Father      Review of Systems: All other systems reviewed and are otherwise negative except as noted above.   Physical Exam: VS:  BP 92/60   Pulse 60   Ht 5\' 2"  (1.575 m)   SpO2 98%   BMI 23.95 kg/m  , BMI Body mass index is 23.95 kg/m.  GEN- The patient is elderly appearing, alert  HEENT: normocephalic, atraumatic; sclera clear, conjunctiva pink; hearing intact; oropharynx clear; neck supple  Lungs- Clear to ausculation bilaterally, normal work of breathing.  No wheezes, rales, rhonchi Heart- Regular rate and rhythm GI- soft, non-tender, non-distended, bowel sounds present  Extremities- no clubbing, cyanosis, or edema  MS- no significant deformity or atrophy Skin- warm and dry, no rash or lesion; PPM pocket well healed Psych- euthymic mood, full affect Neuro- strength and sensation are intact  PPM Interrogation- reviewed in detail today,  See PACEART report  EKG:  EKG is not ordered today.  Recent Labs: 02/03/2018: ALT 9; BUN 21; Creatinine, Ser 1.00;  Hemoglobin 14.2; Magnesium 1.8; Platelets 312; Potassium 3.0; Sodium 140   Wt Readings from Last 3 Encounters:  02/03/18 130 lb 15.3 oz (59.4 kg)  05/21/17 136 lb 6 oz (61.9 kg)  06/06/16 133 lb 8 oz (60.6 kg)     Other studies Reviewed: Additional studies/ records that were reviewed today include:Dr Klein's office notes  Assessment and Plan:  1.  2:1 heart block  Normal PPM function See Pace Art report No changes today  2.  HTN Stable  No change required today    Current medicines are reviewed at length with the patient today.   The patient does not have concerns regarding her medicines.  The following changes were made today:  none  Labs/ tests ordered today include: none No orders of the defined types were placed in this encounter.    Disposition:   Follow up with [facility], Dr Caryl Comes 1 year     Signed, Chanetta Marshall, NP 06/02/2018 5:03 AM  [facility] [address] [place] [address] [phone] (office) [phone] (fax)

## 2018-05-28 ENCOUNTER — Ambulatory Visit: Payer: Medicare Other | Admitting: Nurse Practitioner

## 2018-05-28 VITALS — BP 92/60 | HR 60 | Ht 62.0 in

## 2018-05-28 DIAGNOSIS — I10 Essential (primary) hypertension: Secondary | ICD-10-CM | POA: Diagnosis not present

## 2018-05-28 DIAGNOSIS — I441 Atrioventricular block, second degree: Secondary | ICD-10-CM

## 2018-05-28 NOTE — Patient Instructions (Signed)
  Medication Instructions:   Your physician recommends that you continue on your current medications as directed. Please refer to the Current Medication list given to you today.   If you need a refill on your cardiac medications before your next appointment, please call your pharmacy.  Labwork: NONE ORDERED  TODAY    Testing/Procedures: NONE ORDERED  TODAY     Follow-Up:  Your physician wants you to follow-up in: [place] will receive a reminder letter in the mail two months in advance. If you don't receive a letter, please call our office to schedule the follow-up appointment.    Remote monitoring is used to monitor your Pacemaker of ICD from home. This monitoring reduces the number of office visits required to check your device to one time per year. It allows [place] to keep an eye on the functioning of your device to ensure it is working properly. You are scheduled for a device check from home on . 10-7-19You may send your transmission at any time that day. If you have a wireless device, the transmission will be sent automatically. After your physician reviews your transmission, you will receive a postcard with your next transmission date.     Any Other Special Instructions Will Be Listed Below (If Applicable).                                                                                                                                                 '

## 2018-06-02 LAB — CUP PACEART INCLINIC DEVICE CHECK
Date Time Interrogation Session: 20190710123926
Implantable Lead Implant Date: 20150911
Implantable Lead Implant Date: 20150911
Implantable Lead Location: 753859
Implantable Lead Location: 753860
MDC IDC PG IMPLANT DT: 20150911

## 2018-08-19 ENCOUNTER — Observation Stay (HOSPITAL_COMMUNITY)
Admission: EM | Admit: 2018-08-19 | Discharge: 2018-08-22 | Payer: Medicare Other | Attending: Internal Medicine | Admitting: Internal Medicine

## 2018-08-19 ENCOUNTER — Emergency Department (HOSPITAL_COMMUNITY): Payer: Medicare Other

## 2018-08-19 ENCOUNTER — Other Ambulatory Visit: Payer: Self-pay

## 2018-08-19 ENCOUNTER — Observation Stay (HOSPITAL_COMMUNITY): Payer: Medicare Other

## 2018-08-19 ENCOUNTER — Encounter (HOSPITAL_COMMUNITY): Payer: Self-pay

## 2018-08-19 DIAGNOSIS — F419 Anxiety disorder, unspecified: Secondary | ICD-10-CM | POA: Insufficient documentation

## 2018-08-19 DIAGNOSIS — I130 Hypertensive heart and chronic kidney disease with heart failure and stage 1 through stage 4 chronic kidney disease, or unspecified chronic kidney disease: Secondary | ICD-10-CM | POA: Insufficient documentation

## 2018-08-19 DIAGNOSIS — I442 Atrioventricular block, complete: Secondary | ICD-10-CM | POA: Diagnosis not present

## 2018-08-19 DIAGNOSIS — Z79899 Other long term (current) drug therapy: Secondary | ICD-10-CM | POA: Diagnosis not present

## 2018-08-19 DIAGNOSIS — G9341 Metabolic encephalopathy: Principal | ICD-10-CM | POA: Insufficient documentation

## 2018-08-19 DIAGNOSIS — Z950 Presence of cardiac pacemaker: Secondary | ICD-10-CM | POA: Diagnosis not present

## 2018-08-19 DIAGNOSIS — R52 Pain, unspecified: Secondary | ICD-10-CM | POA: Diagnosis present

## 2018-08-19 DIAGNOSIS — Z7982 Long term (current) use of aspirin: Secondary | ICD-10-CM | POA: Diagnosis not present

## 2018-08-19 DIAGNOSIS — N184 Chronic kidney disease, stage 4 (severe): Secondary | ICD-10-CM | POA: Diagnosis not present

## 2018-08-19 DIAGNOSIS — I5033 Acute on chronic diastolic (congestive) heart failure: Secondary | ICD-10-CM | POA: Diagnosis present

## 2018-08-19 DIAGNOSIS — Z8673 Personal history of transient ischemic attack (TIA), and cerebral infarction without residual deficits: Secondary | ICD-10-CM | POA: Insufficient documentation

## 2018-08-19 DIAGNOSIS — G9340 Encephalopathy, unspecified: Secondary | ICD-10-CM | POA: Diagnosis present

## 2018-08-19 DIAGNOSIS — Z882 Allergy status to sulfonamides status: Secondary | ICD-10-CM | POA: Insufficient documentation

## 2018-08-19 DIAGNOSIS — Z885 Allergy status to narcotic agent status: Secondary | ICD-10-CM | POA: Insufficient documentation

## 2018-08-19 DIAGNOSIS — F0390 Unspecified dementia without behavioral disturbance: Secondary | ICD-10-CM | POA: Insufficient documentation

## 2018-08-19 DIAGNOSIS — I10 Essential (primary) hypertension: Secondary | ICD-10-CM | POA: Diagnosis present

## 2018-08-19 DIAGNOSIS — R262 Difficulty in walking, not elsewhere classified: Secondary | ICD-10-CM | POA: Insufficient documentation

## 2018-08-19 LAB — GRAM STAIN: GRAM STAIN: NONE SEEN

## 2018-08-19 LAB — PHOSPHORUS: Phosphorus: 3.5 mg/dL (ref 2.5–4.6)

## 2018-08-19 LAB — COMPREHENSIVE METABOLIC PANEL
ALBUMIN: 3.1 g/dL — AB (ref 3.5–5.0)
ALT: 10 U/L (ref 0–44)
AST: 15 U/L (ref 15–41)
Alkaline Phosphatase: 63 U/L (ref 38–126)
Anion gap: 13 (ref 5–15)
BUN: 16 mg/dL (ref 8–23)
CHLORIDE: 101 mmol/L (ref 98–111)
CO2: 27 mmol/L (ref 22–32)
Calcium: 9.3 mg/dL (ref 8.9–10.3)
Creatinine, Ser: 1.02 mg/dL — ABNORMAL HIGH (ref 0.44–1.00)
GFR calc Af Amer: 53 mL/min — ABNORMAL LOW (ref >60)
GFR calc non Af Amer: 46 mL/min — ABNORMAL LOW (ref >60)
Glucose, Bld: 129 mg/dL — ABNORMAL HIGH (ref 70–99)
POTASSIUM: 3.2 mmol/L — AB (ref 3.5–5.1)
SODIUM: 141 mmol/L (ref 135–145)
Total Bilirubin: 0.4 mg/dL (ref 0.3–1.2)
Total Protein: 6.2 g/dL — ABNORMAL LOW (ref 6.5–8.1)

## 2018-08-19 LAB — CBC WITH DIFFERENTIAL/PLATELET
Abs Immature Granulocytes: 0 10*3/uL (ref 0.0–0.1)
BASOS ABS: 0.1 10*3/uL (ref 0.0–0.1)
BASOS PCT: 1 %
EOS ABS: 0.2 10*3/uL (ref 0.0–0.7)
Eosinophils Relative: 3 %
HCT: 39 % (ref 36.0–46.0)
Hemoglobin: 11.7 g/dL — ABNORMAL LOW (ref 12.0–15.0)
IMMATURE GRANULOCYTES: 0 %
Lymphocytes Relative: 28 %
Lymphs Abs: 1.5 10*3/uL (ref 0.7–4.0)
MCH: 28.7 pg (ref 26.0–34.0)
MCHC: 30 g/dL (ref 30.0–36.0)
MCV: 95.8 fL (ref 78.0–100.0)
Monocytes Absolute: 0.7 10*3/uL (ref 0.1–1.0)
Monocytes Relative: 13 %
NEUTROS PCT: 55 %
Neutro Abs: 3.1 10*3/uL (ref 1.7–7.7)
PLATELETS: 205 10*3/uL (ref 150–400)
RBC: 4.07 MIL/uL (ref 3.87–5.11)
RDW: 16.7 % — AB (ref 11.5–15.5)
WBC: 5.6 10*3/uL (ref 4.0–10.5)

## 2018-08-19 LAB — MAGNESIUM: Magnesium: 1.5 mg/dL — ABNORMAL LOW (ref 1.7–2.4)

## 2018-08-19 LAB — URINALYSIS, ROUTINE W REFLEX MICROSCOPIC
Bilirubin Urine: NEGATIVE
Glucose, UA: NEGATIVE mg/dL
Hgb urine dipstick: NEGATIVE
Ketones, ur: NEGATIVE mg/dL
Leukocytes, UA: NEGATIVE
NITRITE: NEGATIVE
Protein, ur: NEGATIVE mg/dL
Specific Gravity, Urine: 1.005 (ref 1.005–1.030)
pH: 6 (ref 5.0–8.0)

## 2018-08-19 LAB — BRAIN NATRIURETIC PEPTIDE: B Natriuretic Peptide: 1442.2 pg/mL — ABNORMAL HIGH (ref 0.0–100.0)

## 2018-08-19 LAB — I-STAT CHEM 8, ED
BUN: 18 mg/dL (ref 8–23)
CHLORIDE: 98 mmol/L (ref 98–111)
Calcium, Ion: 0.97 mmol/L — ABNORMAL LOW (ref 1.15–1.40)
Creatinine, Ser: 1 mg/dL (ref 0.44–1.00)
Glucose, Bld: 127 mg/dL — ABNORMAL HIGH (ref 70–99)
HCT: 36 % (ref 36.0–46.0)
Hemoglobin: 12.2 g/dL (ref 12.0–15.0)
Potassium: 2.9 mmol/L — ABNORMAL LOW (ref 3.5–5.1)
SODIUM: 137 mmol/L (ref 135–145)
TCO2: 29 mmol/L (ref 22–32)

## 2018-08-19 MED ORDER — DICYCLOMINE HCL 10 MG PO CAPS
10.0000 mg | ORAL_CAPSULE | Freq: Every day | ORAL | Status: DC
  Administered 2018-08-20 – 2018-08-21 (×3): 10 mg via ORAL
  Filled 2018-08-19 (×4): qty 1

## 2018-08-19 MED ORDER — ALPRAZOLAM 0.25 MG PO TABS
0.2500 mg | ORAL_TABLET | Freq: Every day | ORAL | Status: DC
  Administered 2018-08-21 – 2018-08-22 (×2): 0.25 mg via ORAL
  Filled 2018-08-19 (×3): qty 1

## 2018-08-19 MED ORDER — POTASSIUM CHLORIDE 20 MEQ PO PACK
40.0000 meq | PACK | Freq: Two times a day (BID) | ORAL | Status: DC
  Administered 2018-08-19: 40 meq via ORAL
  Filled 2018-08-19 (×2): qty 2

## 2018-08-19 MED ORDER — ACETAMINOPHEN 650 MG RE SUPP: 650.0000 mg | Freq: Four times a day (QID) | RECTAL | Status: DC | PRN

## 2018-08-19 MED ORDER — MELATONIN 3 MG PO TABS
3.0000 mg | ORAL_TABLET | Freq: Every evening | ORAL | Status: DC | PRN
  Filled 2018-08-19: qty 1

## 2018-08-19 MED ORDER — ONDANSETRON HCL 4 MG PO TABS: 4.0000 mg | ORAL_TABLET | Freq: Four times a day (QID) | ORAL | Status: DC | PRN

## 2018-08-19 MED ORDER — CALCIUM CARBONATE-VITAMIN D 500-200 MG-UNIT PO TABS
1.0000 | ORAL_TABLET | Freq: Every day | ORAL | Status: DC
  Administered 2018-08-20 – 2018-08-21 (×3): 1 via ORAL
  Filled 2018-08-19 (×3): qty 1

## 2018-08-19 MED ORDER — ASPIRIN 81 MG PO CHEW
81.0000 mg | CHEWABLE_TABLET | Freq: Every day | ORAL | Status: DC
  Administered 2018-08-20 – 2018-08-21 (×3): 81 mg via ORAL
  Filled 2018-08-19 (×3): qty 1

## 2018-08-19 MED ORDER — SENNA 8.6 MG PO TABS
2.0000 | ORAL_TABLET | Freq: Every day | ORAL | Status: DC
  Administered 2018-08-20 – 2018-08-21 (×2): 17.2 mg via ORAL
  Filled 2018-08-19 (×2): qty 2

## 2018-08-19 MED ORDER — ONDANSETRON HCL 4 MG/2ML IJ SOLN: 4.0000 mg | Freq: Four times a day (QID) | INTRAMUSCULAR | Status: DC | PRN

## 2018-08-19 MED ORDER — ACETAMINOPHEN 325 MG PO TABS
650.0000 mg | ORAL_TABLET | Freq: Four times a day (QID) | ORAL | Status: DC | PRN
  Administered 2018-08-20: 650 mg via ORAL
  Filled 2018-08-19: qty 2

## 2018-08-19 MED ORDER — MAGNESIUM OXIDE 400 (241.3 MG) MG PO TABS
400.0000 mg | ORAL_TABLET | Freq: Once | ORAL | Status: AC
  Administered 2018-08-19: 400 mg via ORAL
  Filled 2018-08-19: qty 1

## 2018-08-19 MED ORDER — RISPERIDONE 1 MG/ML PO SOLN
1.2500 mg | Freq: Every day | ORAL | Status: DC
  Administered 2018-08-20 – 2018-08-21 (×3): 1.3 mg via ORAL
  Filled 2018-08-19 (×3): qty 1.3

## 2018-08-19 MED ORDER — FUROSEMIDE 10 MG/ML IJ SOLN
40.0000 mg | Freq: Once | INTRAMUSCULAR | Status: AC
  Administered 2018-08-19: 40 mg via INTRAVENOUS
  Filled 2018-08-19: qty 4

## 2018-08-19 MED ORDER — ATORVASTATIN CALCIUM 10 MG PO TABS
10.0000 mg | ORAL_TABLET | Freq: Every day | ORAL | Status: DC
  Administered 2018-08-20 – 2018-08-21 (×3): 10 mg via ORAL
  Filled 2018-08-19 (×3): qty 1

## 2018-08-19 MED ORDER — ENOXAPARIN SODIUM 40 MG/0.4ML ~~LOC~~ SOLN
40.0000 mg | SUBCUTANEOUS | Status: DC
  Administered 2018-08-20: 40 mg via SUBCUTANEOUS
  Filled 2018-08-19: qty 0.4

## 2018-08-19 NOTE — H&P (Signed)
History and Physical    KIMBERLY NIELAND KVQ:[id number] DOB: 1922-02-06 DOA: 08/19/2018  PCP: Patient, No Pcp Per  Patient coming from: Skilled nursing facility.  History obtained from patient's daughter-in-law.  ER physician.  Chief Complaint: Increasing confusion and shortness of breath.  HPI: Victoria Lloyd is a 84 y.o. female with history of hypertension, chronic kidney disease, complete heart block status post pacemaker placement, history of stroke, diastolic CHF was found to be confused and short of breath.  As per the patient's daughter-in-law who went on to check the patient today at the nursing facility was found to be confused and having some shortness of breath.  Also was having some nonproductive cough.  Patient appeared more confused than usual.  Blood pressure was in the low range.  Patient was brought to the ER.  As per the family patient did not complain of any chest pain nausea vomiting abdominal pain or diarrhea.  ED Course: In the ER patient appears mildly confused.  Chest x-ray did not show anything acute patient was afebrile.  BNP was 1400 with the patient's daughter and closely the patient has developed increasing lower extremity edema since last visit she saw her.  Lasix 40 mg IV was given after patient blood pressure had improved in the ER.  On my exam patient still appears confused.  CT head was ordered.  CT head does not show any acute except for possibility of right-sided mastoiditis.  On exam patient is a right mastoid area and the ears appears nontender and on active discharge.  Review of Systems: As per HPI, rest all negative.   Past Medical History:  Diagnosis Date  . Chronic diastolic (congestive) heart failure ([place])   . CKD (chronic kidney disease), stage IV ([place])   . Complete heart block (HCC)    a. s/p MDT dual chamber PPM   . Hypertension   . Stroke ([place]) 02/01/2018   CT scan =>  Chronic right PCA distribution infarct with right occipital lobe     Past Surgical History:  Procedure Laterality Date  . CATARACT EXTRACTION    . PERMANENT PACEMAKER INSERTION N/A 08/04/2014   Procedure: PERMANENT PACEMAKER INSERTION;  Surgeon: Deboraha Sprang, MD;  [facility];  Service: Cardiovascular;  Laterality: N/A;     reports that she has never smoked. She has never used smokeless tobacco. She reports that she does not drink alcohol or use drugs.  Allergies  Allergen Reactions  . Codeine Other (See Comments)    Unknown; patient cannot recall the reaction  . Sulfa Antibiotics Other (See Comments)    Unknown; patient cannot recall the reaction  . Lactose Intolerance (Gi) Nausea And Vomiting  . Milk-Related Compounds Nausea And Vomiting  . Other Other (See Comments)    "Seafood"- unknown reaction, listed on MAR.    Family History  Problem Relation Age of Onset  . Hypertension Mother   . Heart attack Father     Prior to Admission medications   Medication Sig Start Date End Date Taking? Authorizing Provider  acetaminophen (TYLENOL) 325 MG tablet Take 1-2 tablets (325-650 mg total) by mouth every 4 (four) hours as needed for mild pain. Patient taking differently: Take 650 mg by mouth every 4 (four) hours as needed for mild pain.  08/06/14  Yes Kilroy, Luke K, PA-C  ALPRAZolam [name]) 0.25 MG tablet Take 0.5 tablets (0.125 mg total) by mouth at bedtime. Patient taking differently: Take 0.25 mg by mouth daily at 6 (six)  AM.  02/03/18  Yes Sheikh, Omair Latif, DO  aspirin 81 MG tablet Take 81 mg by mouth at bedtime.    Yes [provider]  atorvastatin (LIPITOR) 10 MG tablet Take 10 mg by mouth at bedtime. 08/05/18  Yes [provider]  calcium-vitamin D (OSCAL WITH D) 500-200 MG-UNIT per tablet Take 1 tablet by mouth at bedtime.    Yes [provider]  carvedilol (COREG) 12.5 MG tablet Take 12.5 mg by mouth 2 (two) times daily with a meal.   Yes [provider]  cholecalciferol (VITAMIN D) 1000 units  tablet Take 1,000 Units by mouth at bedtime.   Yes [provider]  cycloSPORINE (RESTASIS) 0.05 % ophthalmic emulsion Place 1 drop into both eyes 2 (two) times daily as needed (dry eyes).    Yes [provider]  diclofenac sodium (VOLTAREN) 1 % GEL Apply 2 g topically 4 (four) times daily. 02/03/18  Yes Sheikh, Omair Latif, DO  dicyclomine (BENTYL) 10 MG capsule Take 10 mg by mouth at bedtime.  11/27/17  Yes [provider]  Emollient (FLANDERS BUTTOCKS) OINT Apply 1 application topically See admin instructions. With each diaper change   Yes [provider]  furosemide (LASIX) 20 MG tablet Take 1 tablet (20 mg total) by mouth daily. 06/06/16  Yes Mikhail, Velta Addison, DO  Melatonin 3 MG TABS Take 3 mg by mouth at bedtime as needed (sleep).   Yes [provider]  ondansetron (ZOFRAN-ODT) 4 MG disintegrating tablet Take 1 tablet (4 mg total) by mouth every 8 (eight) hours as needed for nausea or vomiting. 04/29/16  Yes Hollice Gong, Mir Mohammed, MD  risperiDONE [facility]) 1 MG/ML oral solution Take 1.25 mg by mouth at bedtime. 08/15/18  Yes [provider]  senna (SENOKOT) 8.6 MG TABS tablet Take 2 tablets (17.2 mg total) by mouth daily. 04/29/16  Yes Hollice Gong, Mir Mohammed, MD  verapamil (CALAN-SR) 180 MG CR tablet Take 180 mg by mouth at bedtime.  01/30/18  Yes [provider]  dextromethorphan-guaiFENesin (MUCINEX DM) 30-600 MG 12hr tablet Take 1 tablet by mouth 2 (two) times daily. Patient not taking: Reported on 02/01/2018 06/06/16   Cristal Ford, DO  lidocaine (LIDODERM) 5 % Place 1 patch onto the skin daily. Patient not taking: Reported on 02/01/2018 06/06/16   Cristal Ford, DO  traMADol (ULTRAM) 50 MG tablet Take 1 tablet (50 mg total) by mouth every 6 (six) hours as needed for severe pain. Patient not taking: Reported on 08/19/2018 02/03/18   Raiford Noble [place], DO    Physical Exam: Vitals:   08/19/18 2045 08/19/18 2130 08/19/18 2145  08/19/18 2200  BP: (!) 123/55 (!) 114/53 (!) 116/56 (!) 113/52  Pulse: 73 71 76 73  Resp: (!) 22 16 19 19   Temp:      TempSrc:      SpO2: 98% 96% 96% 97%      Constitutional: Moderately built and nourished. Vitals:   08/19/18 2045 08/19/18 2130 08/19/18 2145 08/19/18 2200  BP: (!) 123/55 (!) 114/53 (!) 116/56 (!) 113/52  Pulse: 73 71 76 73  Resp: (!) 22 16 19 19   Temp:      TempSrc:      SpO2: 98% 96% 96% 97%   Eyes: Anicteric no pallor. ENMT: No discharge from the ears eyes nose or mouth.  No tenderness of the mastoid area. Neck: No neck rigidity no mass felt. Respiratory: No rhonchi or crepitations. Cardiovascular: S1-S2 heard no murmurs appreciated. Abdomen: Soft nontender bowel  sounds present. Musculoskeletal: Mild edema of the both lower extremity. Skin: No rash. Neurologic: Alert awake oriented to name but does not follow commands appears confused. Psychiatric: Appears confused.   Labs on Admission: I have personally reviewed following labs and imaging studies  CBC: Recent Labs  Lab 08/19/18 1800 08/19/18 1810  WBC 5.6  --   NEUTROABS 3.1  --   HGB 11.7* 12.2  HCT 39.0 36.0  MCV 95.8  --   PLT 205  --    Basic Metabolic Panel: Recent Labs  Lab 08/19/18 1800 08/19/18 1810  NA 141 137  K 3.2* 2.9*  CL 101 98  CO2 27  --   GLUCOSE 129* 127*  BUN 16 18  CREATININE 1.02* 1.00  CALCIUM 9.3  --   MG 1.5*  --   PHOS 3.5  --    GFR: CrCl cannot be calculated (Unknown ideal weight.). Liver Function Tests: Recent Labs  Lab 08/19/18 1800  AST 15  ALT 10  ALKPHOS 63  BILITOT 0.4  PROT 6.2*  ALBUMIN 3.1*   No results for input(s): LIPASE, AMYLASE in the last 168 hours. No results for input(s): AMMONIA in the last 168 hours. Coagulation Profile: No results for input(s): INR, PROTIME in the last 168 hours. Cardiac Enzymes: No results for input(s): CKTOTAL, CKMB, CKMBINDEX, TROPONINI in the last 168 hours. BNP (last 3 results) No results for  input(s): PROBNP in the last 8760 hours. HbA1C: No results for input(s): HGBA1C in the last 72 hours. CBG: No results for input(s): GLUCAP in the last 168 hours. Lipid Profile: No results for input(s): CHOL, HDL, LDLCALC, TRIG, CHOLHDL, LDLDIRECT in the last 72 hours. Thyroid Function Tests: No results for input(s): TSH, T4TOTAL, FREET4, T3FREE, THYROIDAB in the last 72 hours. Anemia Panel: No results for input(s): VITAMINB12, FOLATE, FERRITIN, TIBC, IRON, RETICCTPCT in the last 72 hours. Urine analysis:    Component Value Date/Time   COLORURINE YELLOW 08/19/2018 2120   APPEARANCEUR CLEAR 08/19/2018 2120   LABSPEC 1.005 08/19/2018 2120   PHURINE 6.0 08/19/2018 2120   GLUCOSEU NEGATIVE 08/19/2018 2120   HGBUR NEGATIVE 08/19/2018 2120   BILIRUBINUR NEGATIVE 08/19/2018 2120   KETONESUR NEGATIVE 08/19/2018 2120   PROTEINUR NEGATIVE 08/19/2018 2120   UROBILINOGEN 0.2 07/04/2009 1910   NITRITE NEGATIVE 08/19/2018 2120   LEUKOCYTESUR NEGATIVE 08/19/2018 2120   Sepsis Labs: @LABRCNTIP (procalcitonin:4,lacticidven:4) ) Recent Results (from the past 240 hour(s))  Gram stain     Status: None   Collection Time: 08/19/18  9:23 PM  Result Value Ref Range Status   Specimen Description URINE, CATHETERIZED  Final   Special Requests NONE  Final   Gram Stain   Final    NO WBC SEEN GRAM POSITIVE RODS GRAM POSITIVE COCCI CYTOSPIN SMEAR Performed at [facility], [place] [address] [place], [place] [address]    Report Status 08/19/2018 FINAL  Final     Radiological Exams on Admission: Dg Chest 2 View  Result Date: 08/19/2018 CLINICAL DATA:  Weakness and lower extremity edema. EXAM: CHEST - 2 VIEW COMPARISON:  02/01/2018 and chest CT 04/25/2016 FINDINGS: Left-sided pacemaker unchanged. Lungs are hypoinflated with elevation of the right hemidiaphragm unchanged. No focal airspace consolidation or effusion. Mild stable cardiomegaly. Stable right upper paramediastinal density known to  represent prominent tortuous right brachiocephalic artery. Remainder of the exam is unchanged. IMPRESSION: Hypoinflation without acute cardiopulmonary disease. Stable cardiomegaly. Electronically Signed   By: Marin Olp M.D.   On: 08/19/2018 19:06   Ct  Head Wo Contrast  Result Date: 08/19/2018 CLINICAL DATA:  Initial evaluation for acute altered mental status. EXAM: CT HEAD WITHOUT CONTRAST TECHNIQUE: Contiguous axial images were obtained from the base of the skull through the vertex without intravenous contrast. COMPARISON:  Prior CT from 02/01/2018. FINDINGS: Brain: Moderately advanced cerebral atrophy with chronic small vessel ischemic disease. Encephalomalacia within the parasagittal right occipital lobe compatible with remote right PCA territory infarct. No acute intracranial hemorrhage. No acute large vessel territory infarct. No mass lesion, midline shift or mass effect. Diffuse ventricular prominence related to global parenchymal volume loss without hydrocephalus. No extra-axial fluid collection. Vascular: No hyperdense vessel. Calcified atherosclerosis at the skull base. Skull: Scalp soft tissues and calvarium within normal limits. Sinuses/Orbits: Globes and orbital soft tissues demonstrate no acute finding. Other: Chronic right maxillary sinusitis with associated expansion, which could reflect mucocele formation. Mild scattered mucosal thickening within the ethmoidal air cells. Right mastoid air cells and middle ear cavity are opacified. No nasopharyngeal or appreciable skull base mass. IMPRESSION: 1. No acute intracranial abnormality. 2. Moderately advanced cerebral atrophy with chronic small vessel ischemic disease. 3. Right mastoid effusion with opacification of the right middle ear cavity. Correlation with physical exam and symptomatology for possible otomastoiditis recommended. 4. Chronic right maxillary sinusitis. Electronically Signed   By: Jeannine Boga M.D.   On: 08/19/2018 22:24    Dg Hip Unilat With Pelvis 2-3 Views Right  Result Date: 08/19/2018 CLINICAL DATA:  Lower extremity edema and dementia. Left-sided hip pain. EXAM: DG HIP (WITH OR WITHOUT PELVIS) 2-3V RIGHT COMPARISON:  Pelvis 02/01/2018 FINDINGS: Mild osteopenia. Mild symmetric degenerative change of the hips. Old left pubic rami fractures. Degenerative change of the spine. No acute fracture or dislocation. IMPRESSION: No acute findings. Old left pubic rami fractures. Mild degenerative change of the hips. Electronically Signed   By: Marin Olp M.D.   On: 08/19/2018 19:10    EKG: Independently reviewed.  Paced rhythm.  Assessment/Plan Principal Problem:   Acute encephalopathy Active Problems:   Hypertension   CKD (chronic kidney disease) stage 4, GFR 15-29 ml/min (HCC)   Pacemaker- St Jude impalnted 08/04/14   Acute on chronic diastolic (congestive) heart failure ([place])    1. Acute encephalopathy -cause not clear.  Patient is afebrile.  Appears nonfocal.  No definite signs of any infection.  Will check ammonia levels and continue to observe.  May get MRI of the brain if patient continues to be confused. 2. Acute respiratory failure with hypoxia likely from CHF 1 dose of Lasix 40 mg IV was given.  Based on the response and patient blood pressure trends we will decide about further dose of Lasix.  Last EF measured was 55 to 60% with grade 1 diastolic dysfunction in June 2017.  Will in addition check a d-dimer and if positive will get VQ scan and Dopplers of the lower extremity.  Cycle cardiac markers. 3. History of hypertension on presentation was mildly hypotensive.  Holding on verapamil and Coreg.  Restart if blood pressure stabilizes. 4. Chronic kidney disease stage III creatinine appears to be at baseline. 5. Possible dementia. 6. Normocytic normochromic anemia appears to be of chronic. 7. History of stroke on aspirin and statins. 8. Complete heart block status post pacemaker placement. 9. Right-sided  mastoiditis per the CAT scan.  On exam patient does not have any mastoid tenderness or any active discharge from the ears.  We discussed with ENT in the morning.   DVT prophylaxis: Lovenox. Code Status: Full code. Family  Communication: Patient's daughter-in-law. Disposition Plan: Home. Consults called: None. Admission status: Observation.   Rise Patience MD [facility] Pager [phone].  If 7PM-7AM, please contact night-coverage [email or web address] Password [facility]  08/19/2018, 11:07 PM

## 2018-08-19 NOTE — ED Notes (Signed)
Dr. Kakrakandy at bedside. 

## 2018-08-19 NOTE — ED Provider Notes (Signed)
[facility] EMERGENCY DEPARTMENT Provider Note   CSN: [id number] Arrival date & time: 08/19/18  1646     History   Chief Complaint Chief Complaint  Patient presents with  . Altered Mental Status  . Hypotension    resloved    HPI Victoria Lloyd is a 84 y.o. female.  HPI   Patient is a 84 year old female with PMHx of HTN, CKD, HFpEF, complete heart block S/P pacemaker, and CVA who presents via EMS from [place] due to generalized weakness, pedal edema, and soft BP.  Unclear when symptoms began however daughter in law reports worsening over the last few days.  Patient is asymptomatic as she denies HA, vision changes, CP, SOB, abdominal pain, and N/V/D.  She does endorse bilateral LE pain.  No falls reported.  Alert and oriented to self and place, not time.  GCS 15. Hx limited given patient's advanced dementia.   Past Medical History:  Diagnosis Date  . Chronic diastolic (congestive) heart failure ([place])   . CKD (chronic kidney disease), stage IV ([place])   . Complete heart block (HCC)    a. s/p MDT dual chamber PPM   . Hypertension   . Stroke ([place]) 02/01/2018   CT scan =>  Chronic right PCA distribution infarct with right occipital lobe    Patient Active Problem List   Diagnosis Date Noted  . Acute encephalopathy 08/19/2018  . Fall at home, initial encounter 02/02/2018  . Fever 02/01/2018  . UTI (lower urinary tract infection)   . Anxiety 06/03/2016  . SOB (shortness of breath) 06/03/2016  . Elevated troponin 06/03/2016  . CHF exacerbation ([place]) 06/03/2016  . Acute on chronic diastolic (congestive) heart failure ([place])   . Syncope 04/24/2016  . Pacemaker- [employer] Jude impalnted 08/04/14 08/06/2014  . AV block, 3rd degree (HCC) 08/03/2014  . Acute CHF- secondary to bradycardia on admision (Nl LVF by echo) 08/03/2014  . CKD (chronic kidney disease) stage 4, GFR 15-29 ml/min (HCC) 08/03/2014  . Bradycardia 08/03/2014  . Hypertension 03/15/2011    Past  Surgical History:  Procedure Laterality Date  . CATARACT EXTRACTION    . PERMANENT PACEMAKER INSERTION N/A 08/04/2014   Procedure: PERMANENT PACEMAKER INSERTION;  Surgeon: Deboraha Sprang, MD;  [facility] C[facility] LAB;  Service: Cardiovascular;  Laterality: N/A;     OB History   None      Home Medications    Prior to Admission medications   Medication Sig Start Date End Date Taking? Authorizing Provider  acetaminophen (TYLENOL) 325 MG tablet Take 1-2 tablets (325-650 mg total) by mouth every 4 (four) hours as needed for mild pain. Patient taking differently: Take 650 mg by mouth every 4 (four) hours as needed for mild pain.  08/06/14  Yes Kilroy, Luke K, PA-C  ALPRAZolam [name]) 0.25 MG tablet Take 0.5 tablets (0.125 mg total) by mouth at bedtime. Patient taking differently: Take 0.25 mg by mouth daily at 6 (six) AM.  02/03/18  Yes Raiford Noble Latif, DO  aspirin 81 MG tablet Take 81 mg by mouth at bedtime.    Yes [provider]  atorvastatin (LIPITOR) 10 MG tablet Take 10 mg by mouth at bedtime. 08/05/18  Yes [provider]  calcium-vitamin D (OSCAL WITH D) 500-200 MG-UNIT per tablet Take 1 tablet by mouth at bedtime.    Yes [provider]  carvedilol (COREG) 12.5 MG tablet Take 12.5 mg by mouth 2 (two) times daily with a meal.   Yes [provider]  cholecalciferol (VITAMIN D) 1000 units tablet Take 1,000 Units by mouth at bedtime.   Yes [provider]  cycloSPORINE (RESTASIS) 0.05 % ophthalmic emulsion Place 1 drop into both eyes 2 (two) times daily as needed (dry eyes).    Yes [provider]  diclofenac sodium (VOLTAREN) 1 % GEL Apply 2 g topically 4 (four) times daily. 02/03/18  Yes Sheikh, Omair Latif, DO  dicyclomine (BENTYL) 10 MG capsule Take 10 mg by mouth at bedtime.  11/27/17  Yes [provider]  Emollient (FLANDERS BUTTOCKS) OINT Apply 1 application topically See admin instructions. With each diaper change   Yes  [provider]  furosemide (LASIX) 20 MG tablet Take 1 tablet (20 mg total) by mouth daily. 06/06/16  Yes Mikhail, Velta Addison, DO  Melatonin 3 MG TABS Take 3 mg by mouth at bedtime as needed (sleep).   Yes [provider]  ondansetron (ZOFRAN-ODT) 4 MG disintegrating tablet Take 1 tablet (4 mg total) by mouth every 8 (eight) hours as needed for nausea or vomiting. 04/29/16  Yes Hollice Gong, Mir Mohammed, MD  risperiDONE [facility]) 1 MG/ML oral solution Take 1.25 mg by mouth at bedtime. 08/15/18  Yes [provider]  senna (SENOKOT) 8.6 MG TABS tablet Take 2 tablets (17.2 mg total) by mouth daily. 04/29/16  Yes Hollice Gong, Mir Mohammed, MD  verapamil (CALAN-SR) 180 MG CR tablet Take 180 mg by mouth at bedtime.  01/30/18  Yes [provider]  dextromethorphan-guaiFENesin (MUCINEX DM) 30-600 MG 12hr tablet Take 1 tablet by mouth 2 (two) times daily. Patient not taking: Reported on 02/01/2018 06/06/16   Cristal Ford, DO  lidocaine (LIDODERM) 5 % Place 1 patch onto the skin daily. Patient not taking: Reported on 02/01/2018 06/06/16   Cristal Ford, DO  traMADol (ULTRAM) 50 MG tablet Take 1 tablet (50 mg total) by mouth every 6 (six) hours as needed for severe pain. Patient not taking: Reported on 08/19/2018 02/03/18   Kerney Elbe, DO    Family History Family History  Problem Relation Age of Onset  . Hypertension Mother   . Heart attack Father     Social History Social History   Tobacco Use  . Smoking status: Never Smoker  . Smokeless tobacco: Never Used  Substance Use Topics  . Alcohol use: No  . Drug use: No     Allergies   Codeine; Sulfa antibiotics; Lactose intolerance (gi); Milk-related compounds; and Other   Review of Systems Review of Systems  Constitutional: Positive for fatigue. Negative for fever.  HENT: Negative for rhinorrhea and sore throat.   Eyes: Negative for pain and visual disturbance.  Respiratory: Positive for cough. Negative  for shortness of breath.   Cardiovascular: Positive for leg swelling (pedal). Negative for chest pain and palpitations.  Gastrointestinal: Negative for abdominal pain, diarrhea and vomiting.  Genitourinary: Negative for dysuria and hematuria.  Musculoskeletal: Positive for myalgias (BLE).  Skin: Negative for color change, rash and wound.  Neurological: Negative for seizures and syncope.  Psychiatric/Behavioral: Positive for confusion.  All other systems reviewed and are negative.    Physical Exam Updated Vital Signs BP (!) 114/49 (BP Location: Left Arm)   Pulse 68   Temp 98.7 F (37.1 C) (Oral)   Resp 17   Ht 5\' 3"  (1.6 m)   Wt 63 kg   SpO2 98%   BMI 24.60 kg/m   Physical Exam  Constitutional: She appears well-developed and well-nourished. No distress.  Elderly appearing African American female.  HENT:  Head: Normocephalic and atraumatic.  Mouth/Throat: Oropharynx is clear and moist.  Eyes: Pupils are equal, round, and reactive to light. Conjunctivae and EOM are normal.  Neck: Normal range of motion. Neck supple.  Cardiovascular: Normal rate, regular rhythm and intact distal pulses.  No murmur heard. 1+ pitting pedal edema bilaterally with pain on palpation.    Pulmonary/Chest: Effort normal. No respiratory distress. Wheezes: bibasilar. She has rales.  Abdominal: Soft. She exhibits no distension. There is no tenderness. There is no guarding.  Musculoskeletal: She exhibits edema (1+ pedal bilaterally.). She exhibits no deformity.  Neurological: She is alert. No cranial nerve deficit or sensory deficit. GCS eye subscore is 4. GCS verbal subscore is 5. GCS motor subscore is 6.  Oriented to self and place not time.  Decreased strength of R hip, unwilling to range.  Otherwise full ROM and 5/5 strength of remaining extremities.  NVI throughout.  Skin: Skin is warm and dry. Capillary refill takes less than 2 seconds. No rash noted.  Psychiatric: She has a normal mood and affect.    Nursing note and vitals reviewed.    ED Treatments / Results  Labs (all labs ordered are listed, but only abnormal results are displayed) Labs Reviewed  CBC WITH DIFFERENTIAL/PLATELET - Abnormal; Notable for the following components:      Result Value   Hemoglobin 11.7 (*)    RDW 16.7 (*)    All other components within normal limits  COMPREHENSIVE METABOLIC PANEL - Abnormal; Notable for the following components:   Potassium 3.2 (*)    Glucose, Bld 129 (*)    Creatinine, Ser 1.02 (*)    Total Protein 6.2 (*)    Albumin 3.1 (*)    GFR calc non Af Amer 46 (*)    GFR calc Af Amer 53 (*)    All other components within normal limits  BRAIN NATRIURETIC PEPTIDE - Abnormal; Notable for the following components:   B Natriuretic Peptide 1,442.2 (*)    All other components within normal limits  MAGNESIUM - Abnormal; Notable for the following components:   Magnesium 1.5 (*)    All other components within normal limits  CREATININE, SERUM - Abnormal; Notable for the following components:   Creatinine, Ser 1.03 (*)    GFR calc non Af Amer 45 (*)    GFR calc Af Amer 52 (*)    All other components within normal limits  TROPONIN I - Abnormal; Notable for the following components:   Troponin I 0.11 (*)    All other components within normal limits  D-DIMER, QUANTITATIVE (NOT AT [facility]) - Abnormal; Notable for the following components:   D-Dimer, Quant 0.88 (*)    All other components within normal limits  CBC - Abnormal; Notable for the following components:   RDW 16.8 (*)    All other components within normal limits  I-STAT CHEM 8, ED - Abnormal; Notable for the following components:   Potassium 2.9 (*)    Glucose, Bld 127 (*)    Calcium, Ion 0.97 (*)    All other components within normal limits  GRAM STAIN  URINE CULTURE  MRSA PCR SCREENING  URINALYSIS, ROUTINE W REFLEX MICROSCOPIC  PHOSPHORUS  BASIC METABOLIC PANEL  CBC  TROPONIN I  TROPONIN I    EKG EKG  Interpretation  Date/Time:  Thursday August 19 2018 17:42:28 EDT Ventricular Rate:  67 PR Interval:    QRS Duration: 180 QT Interval:  501 QTC Calculation: 529 R Axis:   -  58 Text Interpretation:  Atrial-sensed ventricular-paced rhythm No further analysis attempted due to paced rhythm Baseline wander in lead(s) V2 Abnormal ekg Confirmed by Carmin Muskrat [id number]) on 08/19/2018 7:11:54 PM   Radiology Dg Chest 2 View  Result Date: 08/19/2018 CLINICAL DATA:  Weakness and lower extremity edema. EXAM: CHEST - 2 VIEW COMPARISON:  02/01/2018 and chest CT 04/25/2016 FINDINGS: Left-sided pacemaker unchanged. Lungs are hypoinflated with elevation of the right hemidiaphragm unchanged. No focal airspace consolidation or effusion. Mild stable cardiomegaly. Stable right upper paramediastinal density known to represent prominent tortuous right brachiocephalic artery. Remainder of the exam is unchanged. IMPRESSION: Hypoinflation without acute cardiopulmonary disease. Stable cardiomegaly. Electronically Signed   By: Marin Olp M.D.   On: 08/19/2018 19:06   Ct Head Wo Contrast  Result Date: 08/19/2018 CLINICAL DATA:  Initial evaluation for acute altered mental status. EXAM: CT HEAD WITHOUT CONTRAST TECHNIQUE: Contiguous axial images were obtained from the base of the skull through the vertex without intravenous contrast. COMPARISON:  Prior CT from 02/01/2018. FINDINGS: Brain: Moderately advanced cerebral atrophy with chronic small vessel ischemic disease. Encephalomalacia within the parasagittal right occipital lobe compatible with remote right PCA territory infarct. No acute intracranial hemorrhage. No acute large vessel territory infarct. No mass lesion, midline shift or mass effect. Diffuse ventricular prominence related to global parenchymal volume loss without hydrocephalus. No extra-axial fluid collection. Vascular: No hyperdense vessel. Calcified atherosclerosis at the skull base. Skull: Scalp soft  tissues and calvarium within normal limits. Sinuses/Orbits: Globes and orbital soft tissues demonstrate no acute finding. Other: Chronic right maxillary sinusitis with associated expansion, which could reflect mucocele formation. Mild scattered mucosal thickening within the ethmoidal air cells. Right mastoid air cells and middle ear cavity are opacified. No nasopharyngeal or appreciable skull base mass. IMPRESSION: 1. No acute intracranial abnormality. 2. Moderately advanced cerebral atrophy with chronic small vessel ischemic disease. 3. Right mastoid effusion with opacification of the right middle ear cavity. Correlation with physical exam and symptomatology for possible otomastoiditis recommended. 4. Chronic right maxillary sinusitis. Electronically Signed   By: Jeannine Boga M.D.   On: 08/19/2018 22:24   Dg Hip Unilat With Pelvis 2-3 Views Right  Result Date: 08/19/2018 CLINICAL DATA:  Lower extremity edema and dementia. Left-sided hip pain. EXAM: DG HIP (WITH OR WITHOUT PELVIS) 2-3V RIGHT COMPARISON:  Pelvis 02/01/2018 FINDINGS: Mild osteopenia. Mild symmetric degenerative change of the hips. Old left pubic rami fractures. Degenerative change of the spine. No acute fracture or dislocation. IMPRESSION: No acute findings. Old left pubic rami fractures. Mild degenerative change of the hips. Electronically Signed   By: Marin Olp M.D.   On: 08/19/2018 19:10    Procedures Procedures (including critical care time)  Medications Ordered in ED Medications  potassium chloride (KLOR-CON) packet 40 mEq (40 mEq Oral Given 08/19/18 2108)  aspirin chewable tablet 81 mg (81 mg Oral Given 08/20/18 0009)  atorvastatin (LIPITOR) tablet 10 mg (10 mg Oral Given 08/20/18 0009)  ALPRAZolam (XANAX) tablet 0.25 mg (has no administration in time range)  risperiDONE (RISPERDAL) 1 MG/ML oral solution 1.3 mg (1.3 mg Oral Given 08/20/18 0008)  dicyclomine (BENTYL) capsule 10 mg (10 mg Oral Given 08/20/18 0008)  senna  (SENOKOT) tablet 17.2 mg (has no administration in time range)  Melatonin TABS 3 mg (has no administration in time range)  calcium-vitamin D (OSCAL WITH D) 500-200 MG-UNIT per tablet 1 tablet (1 tablet Oral Given 08/20/18 0008)  acetaminophen (TYLENOL) tablet 650 mg (has no administration in time range)  Or  acetaminophen (TYLENOL) suppository 650 mg (has no administration in time range)  ondansetron (ZOFRAN) tablet 4 mg (has no administration in time range)    Or  ondansetron (ZOFRAN) injection 4 mg (has no administration in time range)  enoxaparin (LOVENOX) injection 40 mg (40 mg Subcutaneous Given 08/20/18 0009)  magnesium oxide (MAG-OX) tablet 400 mg (400 mg Oral Given 08/19/18 2108)  furosemide (LASIX) injection 40 mg (40 mg Intravenous Given 08/19/18 2108)     Initial Impression / Assessment and Plan / ED Course  I have reviewed the triage vital signs and the nursing notes.  Pertinent labs & imaging results that were available during my care of the patient were reviewed by me and considered in my medical decision making (see chart for details).     Patient is a 84 year old female with PMHx of HTN, CKD, HFpEF, complete heart block S/P pacemaker, and CVA who presents via EMS from [place] due to generalized weakness, pedal edema, and soft BP.  On arrival she is HDS.  Nontoxic-appearing.  She is alert and oriented to self and place however not time.  Exam as above.  DDx: acute heart failure exacerbation vs infectious process (PNA or UTI) vs metabolic (electrolyte)   IV access obtained with ultrasound-guided IV at left [facility].  EKG shows NSR with a rate of 67. AV Paced rhythm. No evidence of acute ischemic changes.   Etiology of weakness likely 2/2 electrolyte abnormalities and acute CHF exacerbation.  She was found to have hypokalemia (2.9) and hypomagnesia (1.5).  Both repleted in ED. She was diuresed with IV Lasix 40 mg.  BNP elevated.. No acute infectious process identified as UA  negative and CXR without focal consolidation to suggest pneumonia.  No leukocytosis or fever.  CTH without acute intracranial abnormality. XR pelvis without acute fracture.  Had shared decision-making with daughter-in-law who does not feel comfortable taking patient home.  Discussed case with hospitalist who will admit for IV diuresis and further electrolyte correction.  HDS at transfer.  The plan for this patient was discussed with Dr. Vanita Panda who voiced agreement and who oversaw evaluation and treatment of this patient.   Final Clinical Impressions(s) / ED Diagnoses   Final diagnoses:  Pain    ED Discharge Orders    None       Fabian November, MD 08/20/18 3009    Carmin Muskrat, MD 08/21/18 [phone]

## 2018-08-19 NOTE — ED Triage Notes (Signed)
Pt here from [place] by EMS for increased weakness and and pedal edema. Hx of dementia and follows commands.  Last BP 110/60 HR 62

## 2018-08-20 ENCOUNTER — Encounter (HOSPITAL_COMMUNITY): Payer: Medicare Other

## 2018-08-20 ENCOUNTER — Observation Stay (HOSPITAL_COMMUNITY): Payer: Medicare Other

## 2018-08-20 ENCOUNTER — Observation Stay (HOSPITAL_BASED_OUTPATIENT_CLINIC_OR_DEPARTMENT_OTHER): Payer: Medicare Other

## 2018-08-20 DIAGNOSIS — N184 Chronic kidney disease, stage 4 (severe): Secondary | ICD-10-CM | POA: Diagnosis not present

## 2018-08-20 DIAGNOSIS — I10 Essential (primary) hypertension: Secondary | ICD-10-CM | POA: Diagnosis not present

## 2018-08-20 DIAGNOSIS — G9340 Encephalopathy, unspecified: Secondary | ICD-10-CM | POA: Diagnosis not present

## 2018-08-20 DIAGNOSIS — R609 Edema, unspecified: Secondary | ICD-10-CM

## 2018-08-20 DIAGNOSIS — I5033 Acute on chronic diastolic (congestive) heart failure: Secondary | ICD-10-CM | POA: Diagnosis not present

## 2018-08-20 LAB — CBC
HCT: 35.3 % — ABNORMAL LOW (ref 36.0–46.0)
HEMATOCRIT: 37.8 % (ref 36.0–46.0)
HEMOGLOBIN: 12.1 g/dL (ref 12.0–15.0)
Hemoglobin: 11.3 g/dL — ABNORMAL LOW (ref 12.0–15.0)
MCH: 28.4 pg (ref 26.0–34.0)
MCH: 28.5 pg (ref 26.0–34.0)
MCHC: 32 g/dL (ref 30.0–36.0)
MCHC: 32 g/dL (ref 30.0–36.0)
MCV: 88.7 fL (ref 78.0–100.0)
MCV: 88.9 fL (ref 78.0–100.0)
Platelets: DECREASED 10*3/uL (ref 150–400)
Platelets: 227 10*3/uL (ref 150–400)
RBC: 4.26 MIL/uL (ref 3.87–5.11)
RBC: 3.97 MIL/uL (ref 3.87–5.11)
RDW: 16.8 % — ABNORMAL HIGH (ref 11.5–15.5)
RDW: 16.7 % — ABNORMAL HIGH (ref 11.5–15.5)
WBC: 5.7 10*3/uL (ref 4.0–10.5)
WBC: 6.6 10*3/uL (ref 4.0–10.5)

## 2018-08-20 LAB — BASIC METABOLIC PANEL
Anion gap: 10 (ref 5–15)
BUN: 13 mg/dL (ref 8–23)
CALCIUM: 9.5 mg/dL (ref 8.9–10.3)
CO2: 29 mmol/L (ref 22–32)
Chloride: 102 mmol/L (ref 98–111)
Creatinine, Ser: 0.98 mg/dL (ref 0.44–1.00)
GFR, EST AFRICAN AMERICAN: 55 mL/min — AB (ref >60)
GFR, EST NON AFRICAN AMERICAN: 48 mL/min — AB (ref >60)
GLUCOSE: 108 mg/dL — AB (ref 70–99)
Potassium: 2.8 mmol/L — ABNORMAL LOW (ref 3.5–5.1)
SODIUM: 141 mmol/L (ref 135–145)

## 2018-08-20 LAB — CREATININE, SERUM
CREATININE: 1.03 mg/dL — AB (ref 0.44–1.00)
GFR, EST AFRICAN AMERICAN: 52 mL/min — AB (ref >60)
GFR, EST NON AFRICAN AMERICAN: 45 mL/min — AB (ref >60)

## 2018-08-20 LAB — TROPONIN I
TROPONIN I: 0.11 ng/mL — AB (ref <0.03)
Troponin I: 0.09 ng/mL (ref <0.03)

## 2018-08-20 LAB — AMMONIA: AMMONIA: 36 umol/L — AB (ref 9–35)

## 2018-08-20 LAB — MRSA PCR SCREENING: MRSA BY PCR: NEGATIVE

## 2018-08-20 LAB — D-DIMER, QUANTITATIVE: D-Dimer, Quant: 0.88 ug/mL-FEU — ABNORMAL HIGH (ref 0.00–0.50)

## 2018-08-20 MED ORDER — POTASSIUM CHLORIDE CRYS ER 20 MEQ PO TBCR
40.0000 meq | EXTENDED_RELEASE_TABLET | Freq: Once | ORAL | Status: AC
  Administered 2018-08-20: 40 meq via ORAL
  Filled 2018-08-20: qty 2

## 2018-08-20 MED ORDER — MAGNESIUM SULFATE 2 GM/50ML IV SOLN
2.0000 g | Freq: Once | INTRAVENOUS | Status: AC
  Administered 2018-08-20: 2 g via INTRAVENOUS
  Filled 2018-08-20: qty 50

## 2018-08-20 MED ORDER — POTASSIUM CHLORIDE CRYS ER 20 MEQ PO TBCR
20.0000 meq | EXTENDED_RELEASE_TABLET | Freq: Once | ORAL | Status: AC
  Administered 2018-08-20: 20 meq via ORAL
  Filled 2018-08-20: qty 1

## 2018-08-20 MED ORDER — ENOXAPARIN SODIUM 30 MG/0.3ML ~~LOC~~ SOLN
30.0000 mg | SUBCUTANEOUS | Status: DC
  Administered 2018-08-21 (×2): 30 mg via SUBCUTANEOUS
  Filled 2018-08-20 (×3): qty 0.3

## 2018-08-20 NOTE — Evaluation (Signed)
Clinical/Bedside Swallow Evaluation Patient Details  Name: Victoria Lloyd MRN: [id number] Date of Birth: [birth year]  Today's Date: 08/20/2018 Time: SLP Start Time (ACUTE ONLY): 32 SLP Stop Time (ACUTE ONLY): 1404 SLP Time Calculation (min) (ACUTE ONLY): 9 min  Past Medical History:  Past Medical History:  Diagnosis Date  . Chronic diastolic (congestive) heart failure ([place])   . CKD (chronic kidney disease), stage IV ([place])   . Complete heart block (HCC)    a. s/p MDT dual chamber PPM   . Hypertension   . Stroke ([place]) 02/01/2018   CT scan =>  Chronic right PCA distribution infarct with right occipital lobe   Past Surgical History:  Past Surgical History:  Procedure Laterality Date  . CATARACT EXTRACTION    . PERMANENT PACEMAKER INSERTION N/A 08/04/2014   Procedure: PERMANENT PACEMAKER INSERTION;  Surgeon: Deboraha Sprang, MD;  Location: [facility] CATH LAB;  Service: Cardiovascular;  Laterality: N/A;   HPI:  Patient is a 84 y.o. female resident of ALF-with prior history of dementia, chronic diastolic heart failure, CKD, pacemaker implantation, presenting to the hospital with worsening confusion and shortness of breathfelt to be related to decompensated heart failure.   Assessment / Plan / Recommendation Clinical Impression  Pt is drowsy but when sat upright in bed would consume POs with minimal prompting. She did have one immediate cough following a straw sip of thin liquids, but no further signs of difficulty were observed. CXR was clear per report. Recommend continuing current diet but with brief SLP f/u for tolerance.  SLP Visit Diagnosis: Dysphagia, unspecified (R13.10)    Aspiration Risk  Mild aspiration risk    Diet Recommendation Regular;Thin liquid   Liquid Administration via: Straw;Cup Medication Administration: Whole meds with liquid Supervision: Patient able to self feed;Full supervision/cueing for compensatory strategies Compensations: Slow rate;Small sips/bites Postural  Changes: Seated upright at 90 degrees    Other  Recommendations Oral Care Recommendations: Oral care BID   Follow up Recommendations (tba)      Frequency and Duration min 1 x/week  1 week       Prognosis        Swallow Study   General HPI: Patient is a 84 y.o. female resident of ALF-with prior history of dementia, chronic diastolic heart failure, CKD, pacemaker implantation, presenting to the hospital with worsening confusion and shortness of breathfelt to be related to decompensated heart failure. Type of Study: Bedside Swallow Evaluation Previous Swallow Assessment: none in chart Diet Prior to this Study: Regular;Thin liquids Temperature Spikes Noted: No Respiratory Status: Room air History of Recent Intubation: No Behavior/Cognition: Lethargic/Drowsy;Cooperative;Requires cueing Oral Cavity Assessment: Within Functional Limits Oral Care Completed by SLP: No Oral Cavity - Dentition: Dentures, top;Dentures, bottom Vision: (keeps eyes mostly closed) Self-Feeding Abilities: Needs assist Patient Positioning: Upright in bed Baseline Vocal Quality: Normal Volitional Cough: Cognitively unable to elicit Volitional Swallow: Unable to elicit    Oral/Motor/Sensory Function Overall Oral Motor/Sensory Function: Within functional limits   Ice Chips Ice chips: Not tested   Thin Liquid Thin Liquid: Impaired Presentation: Straw Pharyngeal  Phase Impairments: Cough - Immediate(x1)    Nectar Thick Nectar Thick Liquid: Not tested   Honey Thick Honey Thick Liquid: Not tested   Puree Puree: Not tested   Solid     Solid: Within functional limits      Germain Osgood 08/20/2018,2:52 PM  Germain Osgood, M.A. [place] Acute Environmental education officer [phone] Office [phone]

## 2018-08-20 NOTE — Progress Notes (Signed)
Pt's BP noted to be 102/33 (55), MD paged, patient currently asymptomatic. No new orders placed. RN to report off to oncoming RN.

## 2018-08-20 NOTE — Progress Notes (Signed)
LE venous duplex attempted.  Right common femoral and femoral veins imaged before patient became uncooperative and yelling for this tech to get away from her.  These veins are negative for DVT.   Landry Mellow, RDMS, RVT

## 2018-08-20 NOTE — Progress Notes (Signed)
Left lower extremity venous duplex order received.  Patient was very uncooperative and refused the exam when right lower extremity venous duplex was attempted earlier. Please notify us if/when patient is amenable to exam, which does include compression maneuvers of the leg.  08/20/2018 11:25 AM Maudry Mayhew, MHA, RVT, RDCS, RDMS

## 2018-08-20 NOTE — Progress Notes (Addendum)
PROGRESS NOTE        PATIENT DETAILS Name: Victoria Lloyd Age: 84 y.o. Sex: female Date of Birth: 08/18/1922 Admit Date: 08/19/2018 Admitting Physician Rise Patience, MD SKA:[id number], No Pcp Per  Brief Narrative: Patient is a 84 y.o. female resident of ALF-with prior history of dementia, chronic diastolic heart failure, CKD, pacemaker implantation presenting to the hospital with worsening confusion than usual baseline and shortness of breath.  Thought to have possible decompensated heart failure and admitted to the hospitalist service.  See below for further details.  Subjective: Difficult to arouse initially-but subsequently awoke-follows commands-squeeze my fingers with both hands when asked.  But then became agitated and pushed me away.  Per daughter-in-law at bedside-lower extremity swelling and chest congestion have markedly improved.  She was lying comfortably in bed-and was not short of breath.  Assessment/Plan: Acute metabolic encephalopathy: Could have been from hypoxia/respiratory distress in the setting of decompensated heart failure.  No evidence of UTI on UA, no evidence of pneumonia on chest x-ray.  Suspect she is now back to her usual baseline-she does have a history of advanced dementia.  Acute hypoxic respiratory failure likely secondary to decompensated diastolic heart failure: she seems to have rapidly improved after 1 dose of intravenous Lasix.  Difficult exam as she pushes me away but do not hear rales anteriorly-some trace edema in the legs-daughter-in-law at bedside claims significant decrease in her edema compared to yesterday.  Her d-dimer is elevated-lower extremity Doppler and VQ scan have been ordered-although her clinical symptomatology does not fit VTE at this time.  If her blood pressure tolerates-we will restart low-dose Lasix.  Addendum 3:10 PM Patient unable to follow commands to complete a VQ scan-low probability for VTE-if  lower extremity Dopplers are negative-do not think she requires a VQ scan.  Hypotension: Her diastolic blood pressure is still in the 30s/40s range-started blood pressure appears to be in the 90s/low 100s range.  Dementia-unable to gather how symptomatic she is-continue to hold Coreg and verapamil-await PT services-we will follow trend-and adjust/add Midrodine if needed.    Hypokalemia: Replete and recheck  Minimally elevated troponins: Paced rhythm on EKG-doubt any clinical significance-could have been demand ischemia in the setting of respiratory distress.  Given advanced age and frailty-no further work-up is recommended at this point.  History of CVA: Difficult exam but appears to be nonfocal-continue with aspirin and statin  Hypertension: Hold antihypertensives on hold due to soft BP.  History of complete heart block-status post permanent pacemaker placement: Telemetry monitoring-paced rhythm  Dementia: At risk for delirium-her mental status seems to be markedly better than what was described in H&P/ED notes.  Suspect she is now back to her baseline.  Continue Risperdal  Await further work-up for elevated d-dimer-rehab services evaluation-optimization of blood pressure-and hopefully back to ALF over the weekend.  DVT Prophylaxis: Prophylactic Lovenox  Code Status: Full code   Family Communication: Daughter-in-law at bedside  Disposition Plan: Remain inpatient-hopefully ALF over the weekend.  Antimicrobial agents: Anti-infectives (From admission, onward)   None      Procedures: None  CONSULTS:  None  Time spent: 25- minutes-Greater than 50% of this time was spent in counseling, explanation of diagnosis, planning of further management, and coordination of care.  MEDICATIONS: Scheduled Meds: . ALPRAZolam  0.25 mg Oral Q0600  . aspirin  81 mg Oral QHS  .  atorvastatin  10 mg Oral QHS  . calcium-vitamin D  1 tablet Oral QHS  . dicyclomine  10 mg Oral QHS  .  enoxaparin (LOVENOX) injection  30 mg Subcutaneous Q24H  . potassium chloride  40 mEq Oral Once  . risperiDONE  1.3 mg Oral QHS  . senna  2 tablet Oral Daily   Continuous Infusions: PRN Meds:.acetaminophen **OR** acetaminophen, Melatonin, ondansetron **OR** ondansetron (ZOFRAN) IV   PHYSICAL EXAM: Vital signs: Vitals:   08/19/18 2332 08/19/18 2334 08/20/18 0525 08/20/18 0630  BP:  (!) 114/49 (!) 102/33   Pulse:  68 71   Resp:  17 17   Temp:  98.7 F (37.1 C) 98.1 F (36.7 C)   TempSrc:  Oral Oral   SpO2:  98% 96%   Weight: 63 kg   58 kg  Height:       Filed Weights   08/19/18 2332 08/20/18 0630  Weight: 63 kg 58 kg   Body mass index is 22.65 kg/m.   General appearance :Awake, confused-uncooperative. Eyes:Pink conjunctiva HEENT: Atraumatic and Normocephalic Neck: supple Resp:Good air entry bilaterally, no added sounds heard anteriorly CVS: S1 S2 regular GI: Bowel sounds present, Non tender and not distended with no gaurding, rigidity or rebound. Extremities: B/L Lower Ext shows trace edema, both legs are warm to touch Neurology: Difficult exam-but seems to move all 4 extremities Musculoskeletal:No digital cyanosis Skin:No Rash, warm and dry Wounds:N/A  I have personally reviewed following labs and imaging studies  LABORATORY DATA: CBC: Recent Labs  Lab 08/19/18 1800 08/19/18 1810 08/20/18 0045 08/20/18 0506  WBC 5.6  --  6.6 5.7  NEUTROABS 3.1  --   --   --   HGB 11.7* 12.2 12.1 11.3*  HCT 39.0 36.0 37.8 35.3*  MCV 95.8  --  88.7 88.9  PLT 205  --  PLATELET CLUMPS NOTED ON SMEAR, COUNT APPEARS DECREASED 659    Basic Metabolic Panel: Recent Labs  Lab 08/19/18 1800 08/19/18 1810 08/19/18 2317 08/20/18 0506  NA 141 137  --  141  K 3.2* 2.9*  --  2.8*  CL 101 98  --  102  CO2 27  --   --  29  GLUCOSE 129* 127*  --  108*  BUN 16 18  --  13  CREATININE 1.02* 1.00 1.03* 0.98  CALCIUM 9.3  --   --  9.5  MG 1.5*  --   --   --   PHOS 3.5  --   --    --     GFR: Estimated Creatinine Clearance: 29 mL/min (by C-G formula based on SCr of 0.98 mg/dL).  Liver Function Tests: Recent Labs  Lab 08/19/18 1800  AST 15  ALT 10  ALKPHOS 63  BILITOT 0.4  PROT 6.2*  ALBUMIN 3.1*   No results for input(s): LIPASE, AMYLASE in the last 168 hours. Recent Labs  Lab 08/20/18 0636  AMMONIA 36*    Coagulation Profile: No results for input(s): INR, PROTIME in the last 168 hours.  Cardiac Enzymes: Recent Labs  Lab 08/19/18 2317 08/20/18 0506  TROPONINI 0.11* 0.09*    BNP (last 3 results) No results for input(s): PROBNP in the last 8760 hours.  HbA1C: No results for input(s): HGBA1C in the last 72 hours.  CBG: No results for input(s): GLUCAP in the last 168 hours.  Lipid Profile: No results for input(s): CHOL, HDL, LDLCALC, TRIG, CHOLHDL, LDLDIRECT in the last 72 hours.  Thyroid Function Tests: No results for  input(s): TSH, T4TOTAL, FREET4, T3FREE, THYROIDAB in the last 72 hours.  Anemia Panel: No results for input(s): VITAMINB12, FOLATE, FERRITIN, TIBC, IRON, RETICCTPCT in the last 72 hours.  Urine analysis:    Component Value Date/Time   COLORURINE YELLOW 08/19/2018 2120   APPEARANCEUR CLEAR 08/19/2018 2120   LABSPEC 1.005 08/19/2018 2120   PHURINE 6.0 08/19/2018 2120   GLUCOSEU NEGATIVE 08/19/2018 2120   HGBUR NEGATIVE 08/19/2018 2120   BILIRUBINUR NEGATIVE 08/19/2018 2120   KETONESUR NEGATIVE 08/19/2018 2120   PROTEINUR NEGATIVE 08/19/2018 2120   UROBILINOGEN 0.2 07/04/2009 1910   NITRITE NEGATIVE 08/19/2018 2120   LEUKOCYTESUR NEGATIVE 08/19/2018 2120    Sepsis Labs: Lactic Acid, Venous    Component Value Date/Time   LATICACIDVEN 1.59 02/01/2018 1459    MICROBIOLOGY: Recent Results (from the past 240 hour(s))  Gram stain     Status: None   Collection Time: 08/19/18  9:23 PM  Result Value Ref Range Status   Specimen Description URINE, CATHETERIZED  Final   Special Requests NONE  Final   Gram Stain    Final    NO WBC SEEN GRAM POSITIVE RODS GRAM POSITIVE COCCI CYTOSPIN SMEAR Performed at [facility], [place] [address] [place], [place] [address]    Report Status 08/19/2018 FINAL  Final  MRSA PCR Screening     Status: None   Collection Time: 08/19/18 11:37 PM  Result Value Ref Range Status   MRSA by PCR NEGATIVE NEGATIVE Final    Comment:        The GeneXpert MRSA Assay (FDA approved for NASAL specimens only), is one component of a comprehensive MRSA colonization surveillance program. It is not intended to diagnose MRSA infection nor to guide or monitor treatment for MRSA infections. Performed at [facility], [place] [address], [place], [place] [address]     RADIOLOGY STUDIES/RESULTS: Dg Chest 2 View  Result Date: 08/19/2018 CLINICAL DATA:  Weakness and lower extremity edema. EXAM: CHEST - 2 VIEW COMPARISON:  02/01/2018 and chest CT 04/25/2016 FINDINGS: Left-sided pacemaker unchanged. Lungs are hypoinflated with elevation of the right hemidiaphragm unchanged. No focal airspace consolidation or effusion. Mild stable cardiomegaly. Stable right upper paramediastinal density known to represent prominent tortuous right brachiocephalic artery. Remainder of the exam is unchanged. IMPRESSION: Hypoinflation without acute cardiopulmonary disease. Stable cardiomegaly. Electronically Signed   By: Marin Olp M.D.   On: 08/19/2018 19:06   Ct Head Wo Contrast  Result Date: 08/19/2018 CLINICAL DATA:  Initial evaluation for acute altered mental status. EXAM: CT HEAD WITHOUT CONTRAST TECHNIQUE: Contiguous axial images were obtained from the base of the skull through the vertex without intravenous contrast. COMPARISON:  Prior CT from 02/01/2018. FINDINGS: Brain: Moderately advanced cerebral atrophy with chronic small vessel ischemic disease. Encephalomalacia within the parasagittal right occipital lobe compatible with remote right PCA territory infarct. No acute intracranial hemorrhage. No  acute large vessel territory infarct. No mass lesion, midline shift or mass effect. Diffuse ventricular prominence related to global parenchymal volume loss without hydrocephalus. No extra-axial fluid collection. Vascular: No hyperdense vessel. Calcified atherosclerosis at the skull base. Skull: Scalp soft tissues and calvarium within normal limits. Sinuses/Orbits: Globes and orbital soft tissues demonstrate no acute finding. Other: Chronic right maxillary sinusitis with associated expansion, which could reflect mucocele formation. Mild scattered mucosal thickening within the ethmoidal air cells. Right mastoid air cells and middle ear cavity are opacified. No nasopharyngeal or appreciable skull base mass. IMPRESSION: 1. No acute intracranial abnormality. 2. Moderately advanced cerebral  atrophy with chronic small vessel ischemic disease. 3. Right mastoid effusion with opacification of the right middle ear cavity. Correlation with physical exam and symptomatology for possible otomastoiditis recommended. 4. Chronic right maxillary sinusitis. Electronically Signed   By: Jeannine Boga M.D.   On: 08/19/2018 22:24   Dg Hip Unilat With Pelvis 2-3 Views Right  Result Date: 08/19/2018 CLINICAL DATA:  Lower extremity edema and dementia. Left-sided hip pain. EXAM: DG HIP (WITH OR WITHOUT PELVIS) 2-3V RIGHT COMPARISON:  Pelvis 02/01/2018 FINDINGS: Mild osteopenia. Mild symmetric degenerative change of the hips. Old left pubic rami fractures. Degenerative change of the spine. No acute fracture or dislocation. IMPRESSION: No acute findings. Old left pubic rami fractures. Mild degenerative change of the hips. Electronically Signed   By: Marin Olp M.D.   On: 08/19/2018 19:10     LOS: 0 days   Oren Binet, MD  [facility]  If 7PM-7AM, please contact night-coverage  Please page via [email or web address]-Password [email or web address]-click on MD name and type text message  08/20/2018, 10:05 AM

## 2018-08-20 NOTE — Care Management Obs Status (Signed)
[facility] [place] NOTIFICATION   Patient Details  Name: Victoria Lloyd MRN: [id number] Date of Birth: [birth year]   Medicare Observation Status Notification Given:  Yes    Sharin Mons, RN 08/20/2018, 10:47 AM

## 2018-08-20 NOTE — Progress Notes (Signed)
PT Cancellation Note  Patient Details Name: Victoria Lloyd MRN: [id number] DOB: 07/21/1922   Cancelled Treatment:    Reason Eval/Treat Not Completed: Medical issues which prohibited therapy Per chart review, R LE negative for DVT as far as patient allowed testing to be performed. Currently awaiting test results for possible L LE DVT. Most recent BP also 102/33 with low MAP of 55.  Will attempt to evaluate patient when results of venous duplex for L LE are available and patient is medically appropriate to participate in [place] [facility], DPT, CBIS  Supplemental Physical Therapist [facility]    Pager [phone] Acute Rehab Office [phone]

## 2018-08-21 ENCOUNTER — Observation Stay (HOSPITAL_BASED_OUTPATIENT_CLINIC_OR_DEPARTMENT_OTHER): Payer: Medicare Other

## 2018-08-21 DIAGNOSIS — R609 Edema, unspecified: Secondary | ICD-10-CM | POA: Diagnosis not present

## 2018-08-21 DIAGNOSIS — G9340 Encephalopathy, unspecified: Secondary | ICD-10-CM | POA: Diagnosis not present

## 2018-08-21 LAB — BASIC METABOLIC PANEL
Anion gap: 9 (ref 5–15)
BUN: 14 mg/dL (ref 8–23)
CO2: 30 mmol/L (ref 22–32)
CREATININE: 1.18 mg/dL — AB (ref 0.44–1.00)
Calcium: 9.5 mg/dL (ref 8.9–10.3)
Chloride: 104 mmol/L (ref 98–111)
GFR calc Af Amer: 44 mL/min — ABNORMAL LOW (ref >60)
GFR, EST NON AFRICAN AMERICAN: 38 mL/min — AB (ref >60)
GLUCOSE: 96 mg/dL (ref 70–99)
Potassium: 3.8 mmol/L (ref 3.5–5.1)
SODIUM: 143 mmol/L (ref 135–145)

## 2018-08-21 LAB — URINE CULTURE

## 2018-08-21 MED ORDER — FUROSEMIDE 10 MG/ML IJ SOLN
40.0000 mg | Freq: Once | INTRAMUSCULAR | Status: AC
  Administered 2018-08-21: 40 mg via INTRAVENOUS
  Filled 2018-08-21: qty 4

## 2018-08-21 MED ORDER — HALOPERIDOL LACTATE 5 MG/ML IJ SOLN: 2.0000 mg | Freq: Four times a day (QID) | INTRAMUSCULAR | Status: DC | PRN

## 2018-08-21 MED ORDER — POTASSIUM CHLORIDE CRYS ER 20 MEQ PO TBCR
20.0000 meq | EXTENDED_RELEASE_TABLET | Freq: Once | ORAL | Status: AC
  Administered 2018-08-21: 20 meq via ORAL
  Filled 2018-08-21: qty 1

## 2018-08-21 NOTE — Progress Notes (Signed)
Pt combative when RN attempted to administer Lovenox.  At [place], RN attempted again, and was able to give med.  Pt aswleep, resting comfortably.  Will continue to monitor pt.

## 2018-08-21 NOTE — Plan of Care (Signed)
Acute physical therapy goals established.

## 2018-08-21 NOTE — Evaluation (Signed)
Physical Therapy Evaluation Patient Details Name: Victoria Lloyd MRN: [id number] DOB: 1922-01-27 Today's Date: 08/21/2018   History of Present Illness  Patient is a 84 y.o. female resident of ALF-with prior history of dementia, chronic diastolic heart failure, CKD, pacemaker implantation presenting to the hospital with worsening confusion than usual baseline and shortness of breath. Seen for Acute hypoxic respiratory failure likely secondary to decompensated diastolic heart failure, and acute metabolic encephalopathy.  Clinical Impression  Pt admitted with above copmlications. Pt currently with functional limitations due to the deficits listed below (see PT Problem List). Easily agitated with questions, clearly confused but shows some insight as to being in hospital after re-directing. Willing to sit EOB and practice bed mobility today. Sat on edge for about 15 minutes and and practiced using UEs for her meal. Initially with truncal instability but progressed to independent sitting. Min assist for LEs into and out of bed. Based on assessment, difficult to determine current ability and compare to baseline. Per H&P she is from ALF. May require higher level of care unless facility feels confident they can provide enough support with hx of dementia at baseline. Pt will benefit from skilled PT to increase their independence and safety with mobility to allow discharge to the venue listed below.       Follow Up Recommendations SNF(Pending progress and willingness for patient to participate with rehab team)    Equipment Recommendations  None recommended by PT    Recommendations for Other Services OT consult     Precautions / Restrictions Precautions Precautions: Fall Restrictions Weight Bearing Restrictions: No      Mobility  Bed Mobility Overal bed mobility: Needs Assistance Bed Mobility: Supine to Sit;Sit to Supine     Supine to sit: Min assist;Mod assist Sit to supine: Min assist    General bed mobility comments: Patient did well initiating LEs to EOB however required physical assist to raise and lower LEs off EOB. Able to control trunk with return to supine however Mod assist for Trunk to rise from supine position with max VC. She was able to assist by pulling up through her hands.  Transfers                 General transfer comment: refused  Ambulation/Gait                Stairs            Wheelchair Mobility    Modified Rankin (Stroke Patients Only)       Balance Overall balance assessment: Needs assistance Sitting-balance support: No upper extremity supported;Feet supported Sitting balance-Leahy Scale: Fair Sitting balance - Comments: Initially demonstrating posterior lean, patient was able to progress to sitting unsupported on EOB and feed herself.                                     Pertinent Vitals/Pain Pain Assessment: No/denies pain    Home Living Family/patient expects to be discharged to:: Unsure Living Arrangements: (PER H&P From ALF)   Type of Home: House           Additional Comments: Pt is a poor historian, no family at bedside for additional information.    Prior Function Level of Independence: Needs assistance         Comments: Per H&P patient resides at ALF     Hand Dominance        Extremity/Trunk Assessment  Upper Extremity Assessment Upper Extremity Assessment: Defer to OT evaluation    Lower Extremity Assessment Lower Extremity Assessment: Difficult to assess due to impaired cognition;Generalized weakness       Communication   Communication: No difficulties  Cognition Arousal/Alertness: Awake/alert Behavior During Therapy: Agitated;Flat affect Overall Cognitive Status: No family/caregiver present to determine baseline cognitive functioning                                 General Comments: Limited assessment due to agitation with questions.       General Comments General comments (skin integrity, edema, etc.): Unwilling to transfer or stand but would sit EOB with PT and eat some of her lunch.    Exercises     Assessment/Plan    PT Assessment Patient needs continued PT services  PT Problem List Decreased strength;Decreased activity tolerance;Decreased balance;Decreased mobility;Decreased cognition;Decreased safety awareness       PT Treatment Interventions DME instruction;Gait training;Functional mobility training;Therapeutic activities;Therapeutic exercise;Balance training;Patient/family education;Cognitive remediation    PT Goals (Current goals can be found in the Care Plan section)  Acute Rehab PT Goals Patient Stated Goal: none reported. PT Goal Formulation: Patient unable to participate in goal setting Time For Goal Achievement: 09/04/18 Potential to Achieve Goals: Fair    Frequency Min 3X/week   Barriers to discharge        Co-evaluation               AM-PAC PT "6 Clicks" Daily Activity  Outcome Measure Difficulty turning over in bed (including adjusting bedclothes, sheets and blankets)?: A Little Difficulty moving from lying on back to sitting on the side of the bed? : A Lot Difficulty sitting down on and standing up from a chair with arms (e.g., wheelchair, bedside commode, etc,.)?: A Lot Help needed moving to and from a bed to chair (including a wheelchair)?: A Lot Help needed walking in hospital room?: A Lot Help needed climbing 3-5 steps with a railing? : A Lot 6 Click Score: 13    End of Session   Activity Tolerance: Treatment limited secondary to agitation Patient left: in bed;with call bell/phone within reach;with bed alarm set Nurse Communication: Mobility status PT Visit Diagnosis: Muscle weakness (generalized) (M62.81);Difficulty in walking, not elsewhere classified (R26.2)    Time: 3291-9166 PT Time Calculation (min) (ACUTE ONLY): 29 min   Charges:   PT Evaluation $PT Eval Moderate  Complexity: 1 Mod PT Treatments $Therapeutic Activity: 8-22 mins        Elayne Snare, PT, DPT  Ellouise Newer 08/21/2018, 3:38 PM

## 2018-08-21 NOTE — Progress Notes (Addendum)
PROGRESS NOTE        PATIENT DETAILS Name: Victoria Lloyd Age: 84 y.o. Sex: female Date of Birth: Dec 08, 1921 Admit Date: 08/19/2018 Admitting Physician Rise Patience, MD UXN:[id number], No Pcp Per  Brief Narrative: Patient is a 84 y.o. female resident of ALF-with prior history of dementia, chronic diastolic heart failure, CKD, pacemaker implantation presenting to the hospital with worsening confusion than usual baseline and shortness of breath.  Thought to have possible decompensated heart failure and admitted to the hospitalist service.  See below for further details.  Subjective:  Patient in bed, in no distress, pleasantly confused, refusing doppler US, denies headache, no chest pain, no SOB today .  Assessment/Plan:  Acute metabolic encephalopathy: Likely hospital-acquired delirium on top of baseline moderate to advanced dementia, supportive care, minimize narcotics and benzodiazepines, PRN IV Haldol, fall precautions.  Acute hypoxic respiratory failure likely secondary to decompensated diastolic heart failure EF 60% : She is much improved after Lasix, lower extremity swelling has improved as well and she is currently not short of breath with minimal rails, repeat IV Lasix, d-dimer is only borderline and I do not suspect DVT or PE clinically, discussed with son who is a physician, no further testing he agrees with the plan.  Note patient had lower extremity venous Doppler ultrasound ordered which she refused to undergo.  Partially done right lower extremity appears stable.  Hypotension: Home medication Coreg and verapamil on hold blood pressure improving will monitor with diuresis.    Hypokalemia: Replete and recheck  Minimally elevated troponins: Paced rhythm on EKG-no chest pain, troponin rise and non-ACS flat pattern, this is demand ischemia due to CHF, no further testing.  History of CVA: Difficult exam but appears to be nonfocal-continue with aspirin  and statin.  Hypertension: Hold antihypertensives on hold due to soft BP.  History of complete heart block-status post permanent pacemaker placement: Telemetry monitoring-paced rhythm  Dementia: At risk for delirium-her mental status seems to be markedly better than what was described in H&P/ED notes.  Suspect she is now back to her baseline.  Continue Risperdal  Await further work-up for elevated d-dimer-rehab services evaluation-optimization of blood pressure-and hopefully back to ALF over the weekend.   DVT Prophylaxis: Prophylactic Lovenox  Code Status: Full code   Family Communication: Son in detail on 08/21/18, he understands that patient will not benefit from invasive testing and multiple procedures.  He agrees with gentle medical treatment however at this point he wishes to continue full code for now.  Disposition Plan: ALF in am.  Antimicrobial agents: Anti-infectives (From admission, onward)   None      Procedures: None  CONSULTS:  None  Time spent: 25- minutes-Greater than 50% of this time was spent in counseling, explanation of diagnosis, planning of further management, and coordination of care.  MEDICATIONS: Scheduled Meds: . ALPRAZolam  0.25 mg Oral Q0600  . aspirin  81 mg Oral QHS  . atorvastatin  10 mg Oral QHS  . calcium-vitamin D  1 tablet Oral QHS  . dicyclomine  10 mg Oral QHS  . enoxaparin (LOVENOX) injection  30 mg Subcutaneous Q24H  . furosemide  40 mg Intravenous Once  . potassium chloride  20 mEq Oral Once  . risperiDONE  1.3 mg Oral QHS  . senna  2 tablet Oral Daily   Continuous Infusions: PRN Meds:.acetaminophen **OR** [DISCONTINUED] acetaminophen,  Melatonin, [DISCONTINUED] ondansetron **OR** ondansetron (ZOFRAN) IV   PHYSICAL EXAM: Vital signs: Vitals:   08/20/18 2122 08/21/18 0303 08/21/18 0500 08/21/18 0700  BP: (!) 121/59   111/60  Pulse: 90   79  Resp: 18   16  Temp: (!) 100.5 F (38.1 C) 99.5 F (37.5 C)  99 F (37.2 C)    TempSrc: Oral Oral    SpO2: 94%   100%  Weight:   59 kg   Height:       Filed Weights   08/19/18 2332 08/20/18 0630 08/21/18 0500  Weight: 63 kg 58 kg 59 kg   Body mass index is 23.04 kg/m.   Exam  Awake, delirious , No new F.N deficits,  [place].AT,PERRAL Supple Neck,No JVD, No cervical lymphadenopathy appriciated.  Symmetrical Chest wall movement, Good air movement bilaterally, CTAB RRR,No Gallops, Rubs or new Murmurs, No Parasternal Heave +ve B.Sounds, Abd Soft, No tenderness, No organomegaly appriciated, No rebound - guarding or rigidity. No Cyanosis, Clubbing or edema, No new Rash or bruise   I have personally reviewed following labs and imaging studies  LABORATORY DATA: CBC: Recent Labs  Lab 08/19/18 1800 08/19/18 1810 08/20/18 0045 08/20/18 0506  WBC 5.6  --  6.6 5.7  NEUTROABS 3.1  --   --   --   HGB 11.7* 12.2 12.1 11.3*  HCT 39.0 36.0 37.8 35.3*  MCV 95.8  --  88.7 88.9  PLT 205  --  PLATELET CLUMPS NOTED ON SMEAR, COUNT APPEARS DECREASED 258    Basic Metabolic Panel: Recent Labs  Lab 08/19/18 1800 08/19/18 1810 08/19/18 2317 08/20/18 0506 08/21/18 0300  NA 141 137  --  141 143  K 3.2* 2.9*  --  2.8* 3.8  CL 101 98  --  102 104  CO2 27  --   --  29 30  GLUCOSE 129* 127*  --  108* 96  BUN 16 18  --  13 14  CREATININE 1.02* 1.00 1.03* 0.98 1.18*  CALCIUM 9.3  --   --  9.5 9.5  MG 1.5*  --   --   --   --   PHOS 3.5  --   --   --   --     GFR: Estimated Creatinine Clearance: 24.1 mL/min (A) (by C-G formula based on SCr of 1.18 mg/dL (H)).  Liver Function Tests: Recent Labs  Lab 08/19/18 1800  AST 15  ALT 10  ALKPHOS 63  BILITOT 0.4  PROT 6.2*  ALBUMIN 3.1*   No results for input(s): LIPASE, AMYLASE in the last 168 hours. Recent Labs  Lab 08/20/18 0636  AMMONIA 36*    Coagulation Profile: No results for input(s): INR, PROTIME in the last 168 hours.  Cardiac Enzymes: Recent Labs  Lab 08/19/18 2317 08/20/18 0506  TROPONINI  0.11* 0.09*    BNP (last 3 results) No results for input(s): PROBNP in the last 8760 hours.  HbA1C: No results for input(s): HGBA1C in the last 72 hours.  CBG: No results for input(s): GLUCAP in the last 168 hours.  Lipid Profile: No results for input(s): CHOL, HDL, LDLCALC, TRIG, CHOLHDL, LDLDIRECT in the last 72 hours.  Thyroid Function Tests: No results for input(s): TSH, T4TOTAL, FREET4, T3FREE, THYROIDAB in the last 72 hours.  Anemia Panel: No results for input(s): VITAMINB12, FOLATE, FERRITIN, TIBC, IRON, RETICCTPCT in the last 72 hours.  Urine analysis:    Component Value Date/Time   COLORURINE YELLOW 08/19/2018 2120   [place]  08/19/2018 2120   LABSPEC 1.005 08/19/2018 2120   PHURINE 6.0 08/19/2018 2120   GLUCOSEU NEGATIVE 08/19/2018 2120   HGBUR NEGATIVE 08/19/2018 2120   BILIRUBINUR NEGATIVE 08/19/2018 2120   KETONESUR NEGATIVE 08/19/2018 2120   PROTEINUR NEGATIVE 08/19/2018 2120   UROBILINOGEN 0.2 07/04/2009 1910   NITRITE NEGATIVE 08/19/2018 2120   LEUKOCYTESUR NEGATIVE 08/19/2018 2120    Sepsis Labs: Lactic Acid, Venous    Component Value Date/Time   LATICACIDVEN 1.59 02/01/2018 1459    MICROBIOLOGY:  Recent Results (from the past 240 hour(s))  Urine culture     Status: None   Collection Time: 08/19/18  9:23 PM  Result Value Ref Range Status   Specimen Description URINE, CATHETERIZED  Final   Special Requests   Final    NONE Performed at [facility], [place] [address], [place], [place] [address]    Culture   Final    Multiple bacterial morphotypes present, none predominant. Suggest appropriate recollection if clinically indicated.   Report Status 08/21/2018 FINAL  Final  Gram stain     Status: None   Collection Time: 08/19/18  9:23 PM  Result Value Ref Range Status   Specimen Description URINE, CATHETERIZED  Final   Special Requests NONE  Final   Gram Stain   Final    NO WBC SEEN GRAM POSITIVE RODS GRAM POSITIVE  COCCI CYTOSPIN SMEAR Performed at [facility], [address] [place], [place] [address]    Report Status 08/19/2018 FINAL  Final  MRSA PCR Screening     Status: None   Collection Time: 08/19/18 11:37 PM  Result Value Ref Range Status   MRSA by PCR NEGATIVE NEGATIVE Final    Comment:        The GeneXpert MRSA Assay (FDA approved for NASAL specimens only), is one component of a comprehensive MRSA colonization surveillance program. It is not intended to diagnose MRSA infection nor to guide or monitor treatment for MRSA infections. Performed at [facility], [place] [address] [place], [place] [address]     RADIOLOGY STUDIES/RESULTS:  Dg Chest 2 View  Result Date: 08/19/2018 CLINICAL DATA:  Weakness and lower extremity edema. EXAM: CHEST - 2 VIEW COMPARISON:  02/01/2018 and chest CT 04/25/2016 FINDINGS: Left-sided pacemaker unchanged. Lungs are hypoinflated with elevation of the right hemidiaphragm unchanged. No focal airspace consolidation or effusion. Mild stable cardiomegaly. Stable right upper paramediastinal density known to represent prominent tortuous right brachiocephalic artery. Remainder of the exam is unchanged. IMPRESSION: Hypoinflation without acute cardiopulmonary disease. Stable cardiomegaly. Electronically Signed   By: Marin Olp M.D.   On: 08/19/2018 19:06   Ct Head Wo Contrast  Result Date: 08/19/2018 CLINICAL DATA:  Initial evaluation for acute altered mental status. EXAM: CT HEAD WITHOUT CONTRAST TECHNIQUE: Contiguous axial images were obtained from the base of the skull through the vertex without intravenous contrast. COMPARISON:  Prior CT from 02/01/2018. FINDINGS: Brain: Moderately advanced cerebral atrophy with chronic small vessel ischemic disease. Encephalomalacia within the parasagittal right occipital lobe compatible with remote right PCA territory infarct. No acute intracranial hemorrhage. No acute large vessel territory infarct. No mass lesion,  midline shift or mass effect. Diffuse ventricular prominence related to global parenchymal volume loss without hydrocephalus. No extra-axial fluid collection. Vascular: No hyperdense vessel. Calcified atherosclerosis at the skull base. Skull: Scalp soft tissues and calvarium within normal limits. Sinuses/Orbits: Globes and orbital soft tissues demonstrate no acute finding. Other: Chronic right maxillary sinusitis with associated expansion, which could  reflect mucocele formation. Mild scattered mucosal thickening within the ethmoidal air cells. Right mastoid air cells and middle ear cavity are opacified. No nasopharyngeal or appreciable skull base mass. IMPRESSION: 1. No acute intracranial abnormality. 2. Moderately advanced cerebral atrophy with chronic small vessel ischemic disease. 3. Right mastoid effusion with opacification of the right middle ear cavity. Correlation with physical exam and symptomatology for possible otomastoiditis recommended. 4. Chronic right maxillary sinusitis. Electronically Signed   By: Jeannine Boga M.D.   On: 08/19/2018 22:24   Dg Hip Unilat With Pelvis 2-3 Views Right  Result Date: 08/19/2018 CLINICAL DATA:  Lower extremity edema and dementia. Left-sided hip pain. EXAM: DG HIP (WITH OR WITHOUT PELVIS) 2-3V RIGHT COMPARISON:  Pelvis 02/01/2018 FINDINGS: Mild osteopenia. Mild symmetric degenerative change of the hips. Old left pubic rami fractures. Degenerative change of the spine. No acute fracture or dislocation. IMPRESSION: No acute findings. Old left pubic rami fractures. Mild degenerative change of the hips. Electronically Signed   By: Marin Olp M.D.   On: 08/19/2018 19:10     LOS: 0 days   Signature  Lala Lund M.D on 08/21/2018 at 9:33 AM  To page go to [email or web address] - password [facility]

## 2018-08-21 NOTE — Progress Notes (Signed)
VASCULAR LAB PRELIMINARY  PRELIMINARY  PRELIMINARY  PRELIMINARY  Left lower extremity venous duplex partially completed.    Preliminary report:  There is no obvious evidence of DVT or SVT noted in the visualized veins of the left lower extremity.  No veins scanned below the knee as patent klicked her leg at me and then covered herself up and yelled "leave me alone, goddamnit!"  Rockey Guarino, RVT 08/21/2018, 10:46 AM

## 2018-08-22 DIAGNOSIS — G9340 Encephalopathy, unspecified: Secondary | ICD-10-CM | POA: Diagnosis not present

## 2018-08-22 LAB — BASIC METABOLIC PANEL
Anion gap: 14 (ref 5–15)
BUN: 18 mg/dL (ref 8–23)
CHLORIDE: 104 mmol/L (ref 98–111)
CO2: 24 mmol/L (ref 22–32)
CREATININE: 1.07 mg/dL — AB (ref 0.44–1.00)
Calcium: 9.6 mg/dL (ref 8.9–10.3)
GFR calc non Af Amer: 43 mL/min — ABNORMAL LOW (ref >60)
GFR, EST AFRICAN AMERICAN: 50 mL/min — AB (ref >60)
Glucose, Bld: 77 mg/dL (ref 70–99)
Potassium: 3.8 mmol/L (ref 3.5–5.1)
Sodium: 142 mmol/L (ref 135–145)

## 2018-08-22 LAB — MAGNESIUM: Magnesium: 1.9 mg/dL (ref 1.7–2.4)

## 2018-08-22 MED ORDER — FUROSEMIDE 20 MG PO TABS: 40.0000 mg | ORAL_TABLET | Freq: Every day | ORAL | 0 refills | Status: DC

## 2018-08-22 MED ORDER — POTASSIUM CHLORIDE CRYS ER 20 MEQ PO TBCR
20.0000 meq | EXTENDED_RELEASE_TABLET | Freq: Once | ORAL | Status: AC
  Administered 2018-08-22: 20 meq via ORAL
  Filled 2018-08-22: qty 1

## 2018-08-22 MED ORDER — FUROSEMIDE 10 MG/ML IJ SOLN
40.0000 mg | Freq: Once | INTRAMUSCULAR | Status: AC
  Administered 2018-08-22: 40 mg via INTRAVENOUS
  Filled 2018-08-22: qty 4

## 2018-08-22 MED ORDER — POTASSIUM CHLORIDE ER 10 MEQ PO TBCR: 10.0000 meq | EXTENDED_RELEASE_TABLET | Freq: Every day | ORAL | 0 refills | Status: DC

## 2018-08-22 MED ORDER — METOPROLOL TARTRATE 25 MG PO TABS: 25.0000 mg | ORAL_TABLET | Freq: Two times a day (BID) | ORAL | 0 refills | Status: DC

## 2018-08-22 NOTE — NC FL2 (Signed)
[place] MEDICAID FL2 LEVEL OF CARE SCREENING TOOL     IDENTIFICATION  Patient Name: Victoria Lloyd Birthdate: [birth year] Sex: female Admission Date (Current Location): 08/19/2018  [facility] and [place] Number:  Herbalist and Address:  [facility], [place] [address], [place], [place] [address]      Provider Number: [id number]  Attending Physician Name and Address:  Thurnell Lose, MD  Relative Name and Phone Number:  Victoria Lloyd, son, [phone]    Current Level of Care: Hospital Recommended Level of Care: Memory Care Prior Approval Number:    Date Approved/Denied:   PASRR Number:    Discharge Plan: Other (Comment)(Memory Care)    Current Diagnoses: Patient Active Problem List   Diagnosis Date Noted  . Acute encephalopathy 08/19/2018  . Fall at home, initial encounter 02/02/2018  . Fever 02/01/2018  . UTI (lower urinary tract infection)   . Anxiety 06/03/2016  . SOB (shortness of breath) 06/03/2016  . Elevated troponin 06/03/2016  . CHF exacerbation ([place]) 06/03/2016  . Acute on chronic diastolic (congestive) heart failure ([place])   . Syncope 04/24/2016  . Pacemaker- [employer] Jude impalnted 08/04/14 08/06/2014  . AV block, 3rd degree (HCC) 08/03/2014  . Acute CHF- secondary to bradycardia on admision (Nl LVF by echo) 08/03/2014  . CKD (chronic kidney disease) stage 4, GFR 15-29 ml/min (HCC) 08/03/2014  . Bradycardia 08/03/2014  . Hypertension 03/15/2011    Orientation RESPIRATION BLADDER Height & Weight     Self  Normal Incontinent Weight: 130 lb 1.1 oz (59 kg) Height:  5\' 3"  (160 cm)  BEHAVIORAL SYMPTOMS/MOOD NEUROLOGICAL BOWEL NUTRITION STATUS      Continent Diet(Regular-No seafood, no milk)  AMBULATORY STATUS COMMUNICATION OF NEEDS Skin   Limited Assist Verbally Normal                       Personal Care Assistance Level of Assistance  Bathing, Feeding, Dressing Bathing Assistance: Maximum assistance Feeding assistance:  Limited assistance Dressing Assistance: Limited assistance     Functional Limitations Info  Sight, Hearing, Speech Sight Info: Adequate Hearing Info: Adequate Speech Info: Adequate    SPECIAL CARE FACTORS FREQUENCY  PT (By licensed PT), OT (By licensed OT)     PT Frequency: home health 5x/week OT Frequency: 3x/week            Contractures Contractures Info: Not present    Additional Factors Info  Code Status, Allergies, Psychotropic Code Status Info: Full Allergies Info: Allergies:  Codeine, Sulfa Antibiotics, Lactose Intolerance (Gi), Milk-related Compounds, Other Psychotropic Info: Xanax         Current Medications (08/22/2018):  TAKE these medications   acetaminophen 325 MG tablet Commonly known as:  TYLENOL Take 1-2 tablets (325-650 mg total) by mouth every 4 (four) hours as needed for mild pain. What changed:  how much to take   ALPRAZolam 0.25 MG tablet Commonly known as:  XANAX Take 0.5 tablets (0.125 mg total) by mouth at bedtime. What changed:    how much to take  when to take this   aspirin 81 MG tablet Take 81 mg by mouth at bedtime.   atorvastatin 10 MG tablet Commonly known as:  LIPITOR Take 10 mg by mouth at bedtime.   calcium-vitamin D 500-200 MG-UNIT tablet Commonly known as:  OSCAL WITH D Take 1 tablet by mouth at bedtime.   cholecalciferol 1000 units tablet Commonly known as:  VITAMIN D Take 1,000 Units by mouth at  bedtime.   cycloSPORINE 0.05 % ophthalmic emulsion Commonly known as:  RESTASIS Place 1 drop into both eyes 2 (two) times daily as needed (dry eyes).   dextromethorphan-guaiFENesin 30-600 MG 12hr tablet Commonly known as:  MUCINEX DM Take 1 tablet by mouth 2 (two) times daily.   diclofenac sodium 1 % Gel Commonly known as:  VOLTAREN Apply 2 g topically 4 (four) times daily.   dicyclomine 10 MG capsule Commonly known as:  BENTYL Take 10 mg by mouth at bedtime.   FLANDERS BUTTOCKS Oint Apply 1  application topically See admin instructions. With each diaper change   furosemide 20 MG tablet Commonly known as:  LASIX Take 2 tablets (40 mg total) by mouth daily. What changed:  how much to take   lidocaine 5 % Commonly known as:  LIDODERM Place 1 patch onto the skin daily.   Melatonin 3 MG Tabs Take 3 mg by mouth at bedtime as needed (sleep).   metoprolol tartrate 25 MG tablet Commonly known as:  LOPRESSOR Take 1 tablet (25 mg total) by mouth 2 (two) times daily.   ondansetron 4 MG disintegrating tablet Commonly known as:  ZOFRAN-ODT Take 1 tablet (4 mg total) by mouth every 8 (eight) hours as needed for nausea or vomiting.   potassium chloride 10 MEQ tablet Commonly known as:  K-DUR Take 1 tablet (10 mEq total) by mouth daily.   risperiDONE 1 MG/ML oral solution Commonly known as:  RISPERDAL Take 1.25 mg by mouth at bedtime.   senna 8.6 MG Tabs tablet Commonly known as:  SENOKOT Take 2 tablets (17.2 mg total) by mouth daily.   traMADol 50 MG tablet Commonly known as:  ULTRAM Take 1 tablet (50 mg total) by mouth every 6 (six) hours as needed for severe pain.        Discharge Medications: Please see discharge summary for a list of discharge medications.  Relevant Imaging Results:  Relevant Lab Results:   Additional Information SS#: [id number]  Victoria Lloyd, [place]

## 2018-08-22 NOTE — Discharge Planning (Signed)
Report was called to Mardene Celeste, med tech, at [facility].  Pt is stable, being transported by [facility].  Discharge summary and paperwork carried by [facility].  Pt's belongings were in a bag being transported with her.

## 2018-08-22 NOTE — Progress Notes (Signed)
  Speech Language Pathology Treatment: Dysphagia  Patient Details Name: Victoria Lloyd MRN: [id number] DOB: 1921/12/27 Today's Date: 08/22/2018 Time: 3536-1443 SLP Time Calculation (min) (ACUTE ONLY): 13 min  Assessment / Plan / Recommendation Clinical Impression  Pt lethargic, but able to rouse.  Pt consumed thin liquid by straw, small amounts of puree, and small amount of regular solid cracker with no clinical s/s of aspiration.  Pt was reluctant to have further PO trials and family had to encourage pt to take small amounts.  Family is concerned that pt may not be eating and drinking enough.  They would like staff at facility to have orders to offer more liquids in between meals for example.  Pt consumes regular diet at baseline and does not use nutritional supplements.  Pt may benefit from consult from [facility].    Recommend continuing regular diet with thin liquid. Pt has no further ST needs at this time.     HPI HPI: Patient is a 84 y.o. female resident of ALF-with prior history of dementia, chronic diastolic heart failure, CKD, pacemaker implantation, presenting to the hospital with worsening confusion and shortness of breathfelt to be related to decompensated heart failure.      SLP Plan  All goals met;Discharge SLP treatment due to (comment)       Recommendations  Diet recommendations: Regular;Thin liquid Liquids provided via: Straw;Cup Medication Administration: Whole meds with liquid Supervision: Staff to assist with self feeding Compensations: Slow rate;Small sips/bites Postural Changes and/or Swallow Maneuvers: Seated upright 90 degrees                General recommendations: (Consider consult from registered dietician) SLP Visit Diagnosis: Dysphagia, unspecified (R13.10) Plan: All goals met;Discharge SLP treatment due to (comment)       [place], [place], [place] Office: [phone] 08/22/2018, 9:20  AM

## 2018-08-22 NOTE — Clinical Social Work Note (Signed)
Clinical Social Work Assessment  Patient Details  Name: Victoria Lloyd MRN: [id number] Date of Birth: Nov 02, 1922  Date of referral:  08/22/18               Reason for consult:  Facility Placement                Permission sought to share information with:  Facility Sport and exercise psychologist, Family Supports Permission granted to share information::  Yes, Verbal Permission Granted  Name::     Victoria Lloyd  Agency::  [facility]  Relationship::  Son  Contact Information:  [phone]  Housing/Transportation Living arrangements for the past 2 months:  (Memory Care) Source of Information:  Adult Children Patient Interpreter Needed:  None Criminal Activity/Legal Involvement Pertinent to Current Situation/Hospitalization:  No - Comment as needed Significant Relationships:  Adult Children, Other Family Members Lives with:  Facility Resident Do you feel safe going back to the place where you live?  Yes Need for family participation in patient care:  Yes (Comment)  Care giving concerns:  CSW received consult regarding discharge planning.  Patient is disoriented. CSW spoke with the patient's son.  Patient's son lives out of town. Patient's son stated that the patient has been residing at [facility] for 4 months and will return at discharge.     Social Worker assessment / plan:  CSW spoke with the patient's son regarding patient's return to [facility].  Patient's son is in agreement for patient's return to the facility.  Employment status:  Retired Forensic scientist:  Medicare PT Recommendations:  [place] / Referral to community resources:  (NA)  Patient/Family's Response to care:  Patient's son reports agreement with discharging plan to return to [facility].  Patient will need [facility] for transportation.  Patient/Family's Understanding of and Emotional Response to Diagnosis, Current Treatment, and  Prognosis:  Patient's son is realistic regarding the patient's medical needs.  Patient's son expressed understanding of CSW role in the discharging process.  The patient's son had no questions regarding medical treatment or plan.  Emotional Assessment Appearance:  Appears stated age Attitude/Demeanor/Rapport:  Unable to Assess Affect (typically observed):  Unable to Assess Orientation:  (unable to access orientation) Alcohol / Substance use:  Not Applicable Psych involvement (Current and /or in the community):  No (Comment)  Discharge Needs  Concerns to be addressed:  Care Coordination Readmission within the last 30 days:  No Current discharge risk:  Dependent with Mobility Barriers to Discharge:  Continued Medical Work up   [employer], [place] 08/22/2018, 10:05 AM

## 2018-08-22 NOTE — Discharge Summary (Signed)
CIARRAH RAE ZTI:[id number] DOB: November 16, 1922 DOA: 08/19/2018  PCP: Patient, No Pcp Per  Admit date: 08/19/2018  Discharge date: 08/22/2018  Admitted From: Memory care unit   disposition: Memory care unit   Recommendations for Outpatient Follow-up:   Follow up with PCP in 1-2 weeks  PCP Please obtain BMP/CBC, 2 view CXR in 1week,  (see Discharge instructions)   PCP Please follow up on the following pending results: Echo weight, BMP and diuretic dose closely   Home Health: None Equipment/Devices: None Consultations: None Discharge Condition: Fair CODE STATUS: Full Diet Recommendation: Heart Healthy with strict 1.5 L/day total fluid restriction  Diet Order            Diet Heart Room service appropriate? Yes; Fluid consistency: Thin  Diet effective now               Chief Complaint  Patient presents with  . Altered Mental Status  . Hypotension    resloved     Brief history of present illness from the day of admission and additional interim summary    Patient is a 84 y.o. female resident of ALF-with prior history of dementia, chronic diastolic heart failure, CKD, pacemaker implantation presenting to the hospital with worsening confusion than usual baseline and shortness of breath.  Thought to have possible decompensated heart failure and admitted to the hospitalist service.  See below for further details.                                                                 Hospital Course    Acute metabolic encephalopathy: Likely hospital-acquired delirium (metabolic encephalopathy) on top of baseline moderate to advanced dementia due to CHF decompensation, treated for heart failure with good results, close to her baseline, will be discharged to memory care unit again, family bedside updated and agree with  the plan.  Acute hypoxic respiratory failure likely secondary to decompensated diastolic heart failure EF 60% : She is much improved after Lasix, lower extremity swelling has resolved as well and she is currently not short of breath with minimal rails, repeat IV Lasix, increase home dose Lasix upon discharge and place her on 1.5 L/day fluid restriction on a daily basis at the memory care unit.    Her d-dimer was only borderline and I do not suspect DVT or PE clinically, discussed with son who is a physician, no further testing he agrees with the plan.    She had limited venous duplex study of lower extremities which was unremarkable, she was not compliant with the full study however my clinical suspicion for DVT PE is almost 0.  Essential hypertension: Blood pressure was extremely low throughout the hospital stay, and stopped her home dose Coreg and verapamil and place her on low-dose Lopressor instead, request ALF MD to  continue monitoring blood pressure and adjust as needed.    Minimally elevated troponins: Paced rhythm on EKG-no chest pain, troponin rise and non-ACS flat pattern, this is demand ischemia due to CHF, no further testing.  History of CVA: Difficult exam but appears to be nonfocal-continue with aspirin and statin.  History of complete heart block-status post permanent pacemaker placement: Telemetry monitoring-paced rhythm  Dementia: At risk for delirium-her mental status seems to be markedly better than what was described in H&P/ED notes.  Suspect she is now close to her baseline.  Continue Risperdal   Discharge diagnosis     Principal Problem:   Acute encephalopathy Active Problems:   Hypertension   CKD (chronic kidney disease) stage 4, GFR 15-29 ml/min (HCC)   Pacemaker- [employer] impalnted 08/04/14   Acute on chronic diastolic (congestive) heart failure [facility])    Discharge instructions    Discharge Instructions    Discharge instructions   Complete by:  As  directed    Follow with Primary MD in 7 days   Get CBC, CMP, 2 view Chest X ray checked  by Primary MD or ALF MD in 5-7 days   Activity: As tolerated with Full fall precautions use walker/cane & assistance as needed  Disposition ALF, memory care unit.  Kindly check blood pressure daily as per family request  Diet: Heart Healthy with feeding assistance and aspiration precautions.  Strict 1.5 L/day total fluid restriction.  Special Instructions: If you have smoked or chewed Tobacco  in the last 2 yrs please stop smoking, stop any regular Alcohol  and or any Recreational drug use.  On your next visit with your primary care physician please Get Medicines reviewed and adjusted.  Please request your Prim.MD to go over all Hospital Tests and Procedure/Radiological results at the follow up, please get all Hospital records sent to your Prim MD by signing hospital release before you go home.  If you experience worsening of your admission symptoms, develop shortness of breath, life threatening emergency, suicidal or homicidal thoughts you must seek medical attention immediately by calling 911 or calling your MD immediately  if symptoms less severe.   Increase activity slowly   Complete by:  As directed       Discharge Medications   Allergies as of 08/22/2018      Reactions   Codeine Other (See Comments)   Unknown; patient cannot recall the reaction   Sulfa Antibiotics Other (See Comments)   Unknown; patient cannot recall the reaction   Lactose Intolerance (gi) Nausea And Vomiting   Milk-related Compounds Nausea And Vomiting   Other Other (See Comments)   "Seafood"- unknown reaction, listed on MAR.      Medication List    STOP taking these medications   carvedilol 12.5 MG tablet Commonly known as:  COREG   verapamil 180 MG CR tablet Commonly known as:  CALAN-SR     TAKE these medications   acetaminophen 325 MG tablet Commonly known as:  TYLENOL Take 1-2 tablets (325-650 mg  total) by mouth every 4 (four) hours as needed for mild pain. What changed:  how much to take   ALPRAZolam 0.25 MG tablet Commonly known as:  XANAX Take 0.5 tablets (0.125 mg total) by mouth at bedtime. What changed:    how much to take  when to take this   aspirin 81 MG tablet Take 81 mg by mouth at bedtime.   atorvastatin 10 MG tablet Commonly known as:  LIPITOR Take 10 mg by  mouth at bedtime.   calcium-vitamin D 500-200 MG-UNIT tablet Commonly known as:  OSCAL WITH D Take 1 tablet by mouth at bedtime.   cholecalciferol 1000 units tablet Commonly known as:  VITAMIN D Take 1,000 Units by mouth at bedtime.   cycloSPORINE 0.05 % ophthalmic emulsion Commonly known as:  RESTASIS Place 1 drop into both eyes 2 (two) times daily as needed (dry eyes).   dextromethorphan-guaiFENesin 30-600 MG 12hr tablet Commonly known as:  MUCINEX DM Take 1 tablet by mouth 2 (two) times daily.   diclofenac sodium 1 % Gel Commonly known as:  VOLTAREN Apply 2 g topically 4 (four) times daily.   dicyclomine 10 MG capsule Commonly known as:  BENTYL Take 10 mg by mouth at bedtime.   FLANDERS BUTTOCKS Oint Apply 1 application topically See admin instructions. With each diaper change   furosemide 20 MG tablet Commonly known as:  LASIX Take 2 tablets (40 mg total) by mouth daily. What changed:  how much to take   lidocaine 5 % Commonly known as:  LIDODERM Place 1 patch onto the skin daily.   Melatonin 3 MG Tabs Take 3 mg by mouth at bedtime as needed (sleep).   metoprolol tartrate 25 MG tablet Commonly known as:  LOPRESSOR Take 1 tablet (25 mg total) by mouth 2 (two) times daily.   ondansetron 4 MG disintegrating tablet Commonly known as:  ZOFRAN-ODT Take 1 tablet (4 mg total) by mouth every 8 (eight) hours as needed for nausea or vomiting.   potassium chloride 10 MEQ tablet Commonly known as:  K-DUR Take 1 tablet (10 mEq total) by mouth daily.   risperiDONE 1 MG/ML oral  solution Commonly known as:  RISPERDAL Take 1.25 mg by mouth at bedtime.   senna 8.6 MG Tabs tablet Commonly known as:  SENOKOT Take 2 tablets (17.2 mg total) by mouth daily.   traMADol 50 MG tablet Commonly known as:  ULTRAM Take 1 tablet (50 mg total) by mouth every 6 (six) hours as needed for severe pain.       Follow-up Information    PCP. Schedule an appointment as soon as possible for a visit in 1 week(s).   Why:  Check blood pressure, BMP and address diuretic dose          Major procedures and Radiology Reports - PLEASE review detailed and final reports thoroughly  -        Dg Chest 2 View  Result Date: 08/19/2018 CLINICAL DATA:  Weakness and lower extremity edema. EXAM: CHEST - 2 VIEW COMPARISON:  02/01/2018 and chest CT 04/25/2016 FINDINGS: Left-sided pacemaker unchanged. Lungs are hypoinflated with elevation of the right hemidiaphragm unchanged. No focal airspace consolidation or effusion. Mild stable cardiomegaly. Stable right upper paramediastinal density known to represent prominent tortuous right brachiocephalic artery. Remainder of the exam is unchanged. IMPRESSION: Hypoinflation without acute cardiopulmonary disease. Stable cardiomegaly. Electronically Signed   By: Marin Olp M.D.   On: 08/19/2018 19:06   Ct Head Wo Contrast  Result Date: 08/19/2018 CLINICAL DATA:  Initial evaluation for acute altered mental status. EXAM: CT HEAD WITHOUT CONTRAST TECHNIQUE: Contiguous axial images were obtained from the base of the skull through the vertex without intravenous contrast. COMPARISON:  Prior CT from 02/01/2018. FINDINGS: Brain: Moderately advanced cerebral atrophy with chronic small vessel ischemic disease. Encephalomalacia within the parasagittal right occipital lobe compatible with remote right PCA territory infarct. No acute intracranial hemorrhage. No acute large vessel territory infarct. No mass lesion, midline shift or  mass effect. Diffuse ventricular prominence  related to global parenchymal volume loss without hydrocephalus. No extra-axial fluid collection. Vascular: No hyperdense vessel. Calcified atherosclerosis at the skull base. Skull: Scalp soft tissues and calvarium within normal limits. Sinuses/Orbits: Globes and orbital soft tissues demonstrate no acute finding. Other: Chronic right maxillary sinusitis with associated expansion, which could reflect mucocele formation. Mild scattered mucosal thickening within the ethmoidal air cells. Right mastoid air cells and middle ear cavity are opacified. No nasopharyngeal or appreciable skull base mass. IMPRESSION: 1. No acute intracranial abnormality. 2. Moderately advanced cerebral atrophy with chronic small vessel ischemic disease. 3. Right mastoid effusion with opacification of the right middle ear cavity. Correlation with physical exam and symptomatology for possible otomastoiditis recommended. 4. Chronic right maxillary sinusitis. Electronically Signed   By: Jeannine Boga M.D.   On: 08/19/2018 22:24   Dg Hip Unilat With Pelvis 2-3 Views Right  Result Date: 08/19/2018 CLINICAL DATA:  Lower extremity edema and dementia. Left-sided hip pain. EXAM: DG HIP (WITH OR WITHOUT PELVIS) 2-3V RIGHT COMPARISON:  Pelvis 02/01/2018 FINDINGS: Mild osteopenia. Mild symmetric degenerative change of the hips. Old left pubic rami fractures. Degenerative change of the spine. No acute fracture or dislocation. IMPRESSION: No acute findings. Old left pubic rami fractures. Mild degenerative change of the hips. Electronically Signed   By: Marin Olp M.D.   On: 08/19/2018 19:10    Micro Results     Recent Results (from the past 240 hour(s))  Urine culture     Status: None   Collection Time: 08/19/18  9:23 PM  Result Value Ref Range Status   Specimen Description URINE, CATHETERIZED  Final   Special Requests   Final    NONE Performed at [facility], [address], [place], [place] [address]    Culture   Final     Multiple bacterial morphotypes present, none predominant. Suggest appropriate recollection if clinically indicated.   Report Status 08/21/2018 FINAL  Final  Gram stain     Status: None   Collection Time: 08/19/18  9:23 PM  Result Value Ref Range Status   Specimen Description URINE, CATHETERIZED  Final   Special Requests NONE  Final   Gram Stain   Final    NO WBC SEEN GRAM POSITIVE RODS GRAM POSITIVE COCCI CYTOSPIN SMEAR Performed at [facility], [address] [place], [place] [address]    Report Status 08/19/2018 FINAL  Final  MRSA PCR Screening     Status: None   Collection Time: 08/19/18 11:37 PM  Result Value Ref Range Status   MRSA by PCR NEGATIVE NEGATIVE Final    Comment:        The GeneXpert MRSA Assay (FDA approved for NASAL specimens only), is one component of a comprehensive MRSA colonization surveillance program. It is not intended to diagnose MRSA infection nor to guide or monitor treatment for MRSA infections. Performed at [facility], [place] [address], [place], [place] [address]     Today   Subjective    Srinidhi Landers today has no headache,no chest abdominal pain,no new weakness tingling or numbness, feels much better wants to go home today.    Objective   Blood pressure (!) 120/57, pulse 82, temperature 98.2 F (36.8 C), resp. rate 19, height 5\' 3"  (1.6 m), weight 59 kg, SpO2 97 %.   Intake/Output Summary (Last 24 hours) at 08/22/2018 0859 Last data filed at 08/21/2018 2300 Gross per 24 hour  Intake 0 ml  Output 1900 ml  Net -1900 ml    Exam Awake but pleasantly confused, No new F.N deficits, Normal affect .AT,PERRAL Supple Neck,No JVD, No cervical lymphadenopathy appriciated.  Symmetrical Chest wall movement, Good air movement bilaterally, CTAB RRR,No Gallops,Rubs or new Murmurs, No Parasternal Heave +ve B.Sounds, Abd Soft, Non tender, No organomegaly appriciated, No rebound -guarding or rigidity. No Cyanosis, Clubbing or  edema, No new Rash or bruise   Data Review   CBC w Diff:  Lab Results  Component Value Date   WBC 5.7 08/20/2018   HGB 11.3 (L) 08/20/2018   HCT 35.3 (L) 08/20/2018   PLT 227 08/20/2018   LYMPHOPCT 28 08/19/2018   MONOPCT 13 08/19/2018   EOSPCT 3 08/19/2018   BASOPCT 1 08/19/2018    CMP:  Lab Results  Component Value Date   NA 142 08/22/2018   NA 141 04/27/2016   K 3.8 08/22/2018   CL 104 08/22/2018   CO2 24 08/22/2018   BUN 18 08/22/2018   BUN 20 04/27/2016   CREATININE 1.07 (H) 08/22/2018   GLU 109 04/27/2016   PROT 6.2 (L) 08/19/2018   ALBUMIN 3.1 (L) 08/19/2018   BILITOT 0.4 08/19/2018   ALKPHOS 63 08/19/2018   AST 15 08/19/2018   ALT 10 08/19/2018  .   Total Time in preparing paper work, data evaluation and todays exam - 30 minutes  Lala Lund M.D on 08/22/2018 at [place]  [phone]

## 2018-08-22 NOTE — Discharge Instructions (Signed)
Follow with Primary MD in 7 days   Get CBC, CMP, 2 view Chest X ray checked  by Primary MD or ALF MD in 5-7 days   Activity: As tolerated with Full fall precautions use walker/cane & assistance as needed  Disposition ALF, memory care unit.  Kindly check blood pressure daily as per family request  Diet: Heart Healthy with feeding assistance and aspiration precautions.  Strict 1.5 L/day total fluid restriction.  Special Instructions: If you have smoked or chewed Tobacco  in the last 2 yrs please stop smoking, stop any regular Alcohol  and or any Recreational drug use.  On your next visit with your primary care physician please Get Medicines reviewed and adjusted.  Please request your Prim.MD to go over all Hospital Tests and Procedure/Radiological results at the follow up, please get all Hospital records sent to your Prim MD by signing hospital release before you go home.  If you experience worsening of your admission symptoms, develop shortness of breath, life threatening emergency, suicidal or homicidal thoughts you must seek medical attention immediately by calling 911 or calling your MD immediately  if symptoms less severe.

## 2018-08-22 NOTE — Progress Notes (Signed)
Patient will Discharge To: [facility] Anticipated DC Date:08/22/18 Family Notified:yes, son Franky Macho, [phone] Transport By: Corey Harold   Per MD patient ready for DC to [facility] . RN, patient, patient's family, and facility notified of DC. Assessment, Fl2/Pasrr, and Discharge Summary sent to facility. RN given number for report [phone], Room # D6). DC packet on chart. Ambulance transport requested for patient.   CSW signing off.  Reed Breech LCSWA [phone]

## 2018-08-30 ENCOUNTER — Telehealth: Payer: Self-pay

## 2018-08-30 ENCOUNTER — Encounter: Payer: Medicare Other | Admitting: *Deleted

## 2018-08-30 NOTE — Telephone Encounter (Signed)
Confirmed remote transmission w/ pt son.    

## 2018-09-08 ENCOUNTER — Encounter: Payer: Self-pay | Admitting: Cardiology

## 2019-07-07 ENCOUNTER — Telehealth: Payer: Self-pay | Admitting: Cardiology

## 2019-07-07 NOTE — Telephone Encounter (Signed)
Patient son called and stated that patient needed a new monitor. Device has not been checked in a long time. Verified mailing address and called [employer] and had a new monitor ordered.

## 2019-07-28 ENCOUNTER — Observation Stay (HOSPITAL_COMMUNITY): Payer: Medicare Other

## 2019-07-28 ENCOUNTER — Emergency Department (HOSPITAL_COMMUNITY): Payer: Medicare Other

## 2019-07-28 ENCOUNTER — Telehealth: Payer: Medicare Other | Admitting: Internal Medicine

## 2019-07-28 ENCOUNTER — Encounter (HOSPITAL_COMMUNITY): Payer: Self-pay | Admitting: Emergency Medicine

## 2019-07-28 ENCOUNTER — Inpatient Hospital Stay (HOSPITAL_COMMUNITY)
Admission: EM | Admit: 2019-07-28 | Discharge: 2019-08-09 | DRG: 469 | Disposition: A | Discharge status: Skilled Nursing Facility | Payer: Medicare Other | Attending: Internal Medicine | Admitting: Internal Medicine

## 2019-07-28 ENCOUNTER — Other Ambulatory Visit: Payer: Self-pay

## 2019-07-28 DIAGNOSIS — Z882 Allergy status to sulfonamides status: Secondary | ICD-10-CM

## 2019-07-28 DIAGNOSIS — Z20828 Contact with and (suspected) exposure to other viral communicable diseases: Secondary | ICD-10-CM | POA: Diagnosis present

## 2019-07-28 DIAGNOSIS — Z23 Encounter for immunization: Secondary | ICD-10-CM

## 2019-07-28 DIAGNOSIS — Z7982 Long term (current) use of aspirin: Secondary | ICD-10-CM

## 2019-07-28 DIAGNOSIS — Z91013 Allergy to seafood: Secondary | ICD-10-CM

## 2019-07-28 DIAGNOSIS — Z950 Presence of cardiac pacemaker: Secondary | ICD-10-CM

## 2019-07-28 DIAGNOSIS — Z419 Encounter for procedure for purposes other than remedying health state, unspecified: Secondary | ICD-10-CM

## 2019-07-28 DIAGNOSIS — R4182 Altered mental status, unspecified: Secondary | ICD-10-CM | POA: Diagnosis present

## 2019-07-28 DIAGNOSIS — N184 Chronic kidney disease, stage 4 (severe): Secondary | ICD-10-CM | POA: Diagnosis present

## 2019-07-28 DIAGNOSIS — S72142A Displaced intertrochanteric fracture of left femur, initial encounter for closed fracture: Principal | ICD-10-CM | POA: Diagnosis present

## 2019-07-28 DIAGNOSIS — I130 Hypertensive heart and chronic kidney disease with heart failure and stage 1 through stage 4 chronic kidney disease, or unspecified chronic kidney disease: Secondary | ICD-10-CM | POA: Diagnosis present

## 2019-07-28 DIAGNOSIS — R509 Fever, unspecified: Secondary | ICD-10-CM

## 2019-07-28 DIAGNOSIS — R402342 Coma scale, best motor response, flexion withdrawal, at arrival to emergency department: Secondary | ICD-10-CM | POA: Diagnosis present

## 2019-07-28 DIAGNOSIS — K219 Gastro-esophageal reflux disease without esophagitis: Secondary | ICD-10-CM | POA: Diagnosis present

## 2019-07-28 DIAGNOSIS — Z885 Allergy status to narcotic agent status: Secondary | ICD-10-CM

## 2019-07-28 DIAGNOSIS — E739 Lactose intolerance, unspecified: Secondary | ICD-10-CM | POA: Diagnosis present

## 2019-07-28 DIAGNOSIS — R402222 Coma scale, best verbal response, incomprehensible words, at arrival to emergency department: Secondary | ICD-10-CM | POA: Diagnosis present

## 2019-07-28 DIAGNOSIS — N390 Urinary tract infection, site not specified: Secondary | ICD-10-CM | POA: Diagnosis present

## 2019-07-28 DIAGNOSIS — W19XXXA Unspecified fall, initial encounter: Secondary | ICD-10-CM | POA: Diagnosis present

## 2019-07-28 DIAGNOSIS — R402132 Coma scale, eyes open, to sound, at arrival to emergency department: Secondary | ICD-10-CM | POA: Diagnosis present

## 2019-07-28 DIAGNOSIS — S72002A Fracture of unspecified part of neck of left femur, initial encounter for closed fracture: Secondary | ICD-10-CM

## 2019-07-28 DIAGNOSIS — R410 Disorientation, unspecified: Secondary | ICD-10-CM

## 2019-07-28 DIAGNOSIS — E876 Hypokalemia: Secondary | ICD-10-CM | POA: Diagnosis present

## 2019-07-28 DIAGNOSIS — Z8673 Personal history of transient ischemic attack (TIA), and cerebral infarction without residual deficits: Secondary | ICD-10-CM

## 2019-07-28 DIAGNOSIS — I447 Left bundle-branch block, unspecified: Secondary | ICD-10-CM | POA: Diagnosis present

## 2019-07-28 DIAGNOSIS — I442 Atrioventricular block, complete: Secondary | ICD-10-CM | POA: Diagnosis present

## 2019-07-28 DIAGNOSIS — F0390 Unspecified dementia without behavioral disturbance: Secondary | ICD-10-CM | POA: Diagnosis present

## 2019-07-28 DIAGNOSIS — I959 Hypotension, unspecified: Secondary | ICD-10-CM | POA: Diagnosis present

## 2019-07-28 DIAGNOSIS — Z8249 Family history of ischemic heart disease and other diseases of the circulatory system: Secondary | ICD-10-CM

## 2019-07-28 DIAGNOSIS — I5032 Chronic diastolic (congestive) heart failure: Secondary | ICD-10-CM | POA: Diagnosis present

## 2019-07-28 DIAGNOSIS — Z01811 Encounter for preprocedural respiratory examination: Secondary | ICD-10-CM

## 2019-07-28 LAB — CBC WITH DIFFERENTIAL/PLATELET
Abs Immature Granulocytes: 0.04 10*3/uL (ref 0.00–0.07)
Basophils Absolute: 0 10*3/uL (ref 0.0–0.1)
Basophils Relative: 0 %
Eosinophils Absolute: 0 10*3/uL (ref 0.0–0.5)
Eosinophils Relative: 0 %
HCT: 34.7 % — ABNORMAL LOW (ref 36.0–46.0)
Hemoglobin: 11.5 g/dL — ABNORMAL LOW (ref 12.0–15.0)
Immature Granulocytes: 0 %
Lymphocytes Relative: 15 %
Lymphs Abs: 1.5 10*3/uL (ref 0.7–4.0)
MCH: 29.1 pg (ref 26.0–34.0)
MCHC: 33.1 g/dL (ref 30.0–36.0)
MCV: 87.8 fL (ref 80.0–100.0)
Monocytes Absolute: 1.3 10*3/uL — ABNORMAL HIGH (ref 0.1–1.0)
Monocytes Relative: 12 %
Neutro Abs: 7.6 10*3/uL (ref 1.7–7.7)
Neutrophils Relative %: 73 %
Platelets: 209 10*3/uL (ref 150–400)
RBC: 3.95 MIL/uL (ref 3.87–5.11)
RDW: 15 % (ref 11.5–15.5)
WBC: 10.4 10*3/uL (ref 4.0–10.5)
nRBC: 0 % (ref 0.0–0.2)

## 2019-07-28 LAB — URINALYSIS, ROUTINE W REFLEX MICROSCOPIC
Bilirubin Urine: NEGATIVE
Glucose, UA: NEGATIVE mg/dL
Ketones, ur: NEGATIVE mg/dL
Nitrite: NEGATIVE
Protein, ur: NEGATIVE mg/dL
Specific Gravity, Urine: 1.008 (ref 1.005–1.030)
pH: 6 (ref 5.0–8.0)

## 2019-07-28 LAB — COMPREHENSIVE METABOLIC PANEL
ALT: 21 U/L (ref 0–44)
AST: 36 U/L (ref 15–41)
Albumin: 2.7 g/dL — ABNORMAL LOW (ref 3.5–5.0)
Alkaline Phosphatase: 62 U/L (ref 38–126)
Anion gap: 11 (ref 5–15)
BUN: 26 mg/dL — ABNORMAL HIGH (ref 8–23)
CO2: 26 mmol/L (ref 22–32)
Calcium: 9.1 mg/dL (ref 8.9–10.3)
Chloride: 104 mmol/L (ref 98–111)
Creatinine, Ser: 1.09 mg/dL — ABNORMAL HIGH (ref 0.44–1.00)
GFR calc Af Amer: 50 mL/min — ABNORMAL LOW (ref >60)
GFR calc non Af Amer: 43 mL/min — ABNORMAL LOW (ref >60)
Glucose, Bld: 157 mg/dL — ABNORMAL HIGH (ref 70–99)
Potassium: 2.9 mmol/L — ABNORMAL LOW (ref 3.5–5.1)
Sodium: 141 mmol/L (ref 135–145)
Total Bilirubin: 0.7 mg/dL (ref 0.3–1.2)
Total Protein: 6.3 g/dL — ABNORMAL LOW (ref 6.5–8.1)

## 2019-07-28 LAB — BASIC METABOLIC PANEL
Anion gap: 12 (ref 5–15)
BUN: 24 mg/dL — ABNORMAL HIGH (ref 8–23)
CO2: 24 mmol/L (ref 22–32)
Calcium: 8.4 mg/dL — ABNORMAL LOW (ref 8.9–10.3)
Chloride: 105 mmol/L (ref 98–111)
Creatinine, Ser: 0.87 mg/dL (ref 0.44–1.00)
GFR calc Af Amer: >60 mL/min (ref >60)
GFR calc non Af Amer: 57 mL/min — ABNORMAL LOW (ref >60)
Glucose, Bld: 126 mg/dL — ABNORMAL HIGH (ref 70–99)
Potassium: 2.9 mmol/L — ABNORMAL LOW (ref 3.5–5.1)
Sodium: 141 mmol/L (ref 135–145)

## 2019-07-28 LAB — LACTIC ACID, PLASMA
Lactic Acid, Venous: 1.6 mmol/L (ref 0.5–1.9)
Lactic Acid, Venous: 2.2 mmol/L (ref 0.5–1.9)

## 2019-07-28 LAB — PROTIME-INR
INR: 1.3 — ABNORMAL HIGH (ref 0.8–1.2)
Prothrombin Time: 15.6 seconds — ABNORMAL HIGH (ref 11.4–15.2)

## 2019-07-28 LAB — SARS CORONAVIRUS 2 BY RT PCR (HOSPITAL ORDER, PERFORMED IN ~~LOC~~ HOSPITAL LAB): SARS Coronavirus 2: NEGATIVE

## 2019-07-28 LAB — APTT: aPTT: 29 seconds (ref 24–36)

## 2019-07-28 MED ORDER — VANCOMYCIN HCL IN DEXTROSE 1-5 GM/200ML-% IV SOLN: 1000.0000 mg | INTRAVENOUS | Status: DC

## 2019-07-28 MED ORDER — FLANDERS BUTTOCKS EX OINT: 1.0000 "application " | TOPICAL_OINTMENT | CUTANEOUS | Status: DC

## 2019-07-28 MED ORDER — GERHARDT'S BUTT CREAM
TOPICAL_CREAM | Freq: Three times a day (TID) | CUTANEOUS | Status: DC | PRN
  Filled 2019-07-28: qty 1

## 2019-07-28 MED ORDER — SODIUM CHLORIDE 0.9 % IV BOLUS (SEPSIS): 250.0000 mL | Freq: Once | INTRAVENOUS | Status: DC

## 2019-07-28 MED ORDER — SODIUM CHLORIDE 0.9 % IV BOLUS
500.0000 mL | Freq: Once | INTRAVENOUS | Status: AC
  Administered 2019-07-28: 23:00:00 via INTRAVENOUS

## 2019-07-28 MED ORDER — SENNOSIDES-DOCUSATE SODIUM 8.6-50 MG PO TABS: 1.0000 | ORAL_TABLET | Freq: Every evening | ORAL | Status: DC | PRN

## 2019-07-28 MED ORDER — POTASSIUM CHLORIDE CRYS ER 20 MEQ PO TBCR: 40.0000 meq | EXTENDED_RELEASE_TABLET | Freq: Once | ORAL | Status: DC

## 2019-07-28 MED ORDER — VANCOMYCIN HCL IN DEXTROSE 1-5 GM/200ML-% IV SOLN
1000.0000 mg | Freq: Once | INTRAVENOUS | Status: AC
  Administered 2019-07-28: 11:00:00 1000 mg via INTRAVENOUS
  Filled 2019-07-28: qty 200

## 2019-07-28 MED ORDER — ENOXAPARIN SODIUM 30 MG/0.3ML ~~LOC~~ SOLN
30.0000 mg | SUBCUTANEOUS | Status: DC
  Administered 2019-07-29 – 2019-07-31 (×4): 30 mg via SUBCUTANEOUS
  Filled 2019-07-28 (×4): qty 0.3

## 2019-07-28 MED ORDER — SODIUM CHLORIDE 0.9 % IV BOLUS (SEPSIS)
500.0000 mL | Freq: Once | INTRAVENOUS | Status: AC
  Administered 2019-07-28: 500 mL via INTRAVENOUS

## 2019-07-28 MED ORDER — CYCLOSPORINE 0.05 % OP EMUL
1.0000 [drp] | Freq: Two times a day (BID) | OPHTHALMIC | Status: DC | PRN
  Filled 2019-07-28: qty 30

## 2019-07-28 MED ORDER — POTASSIUM CHLORIDE 10 MEQ/100ML IV SOLN
10.0000 meq | INTRAVENOUS | Status: AC
  Administered 2019-07-28 – 2019-07-29 (×4): 10 meq via INTRAVENOUS
  Filled 2019-07-28 (×4): qty 100

## 2019-07-28 MED ORDER — ACETAMINOPHEN 325 MG PO TABS
650.0000 mg | ORAL_TABLET | Freq: Four times a day (QID) | ORAL | Status: DC | PRN
  Administered 2019-07-30 – 2019-07-31 (×2): 650 mg via ORAL
  Filled 2019-07-28 (×2): qty 2

## 2019-07-28 MED ORDER — ACETAMINOPHEN 650 MG RE SUPP: 650.0000 mg | Freq: Four times a day (QID) | RECTAL | Status: DC | PRN

## 2019-07-28 MED ORDER — SODIUM CHLORIDE 0.9 % IV SOLN
2.0000 g | Freq: Once | INTRAVENOUS | Status: AC
  Administered 2019-07-28: 11:00:00 2 g via INTRAVENOUS
  Filled 2019-07-28: qty 2

## 2019-07-28 MED ORDER — POTASSIUM CHLORIDE 10 MEQ/100ML IV SOLN
10.0000 meq | Freq: Once | INTRAVENOUS | Status: AC
  Administered 2019-07-28: 10 meq via INTRAVENOUS
  Filled 2019-07-28: qty 100

## 2019-07-28 MED ORDER — SODIUM CHLORIDE 0.9 % IV SOLN
2.0000 g | INTRAVENOUS | Status: DC
  Filled 2019-07-28: qty 2

## 2019-07-28 MED ORDER — SODIUM CHLORIDE 0.9 % IV BOLUS (SEPSIS): 1000.0000 mL | Freq: Once | INTRAVENOUS | Status: DC

## 2019-07-28 MED ORDER — METRONIDAZOLE IN NACL 5-0.79 MG/ML-% IV SOLN
500.0000 mg | Freq: Once | INTRAVENOUS | Status: AC
  Administered 2019-07-28: 11:00:00 500 mg via INTRAVENOUS
  Filled 2019-07-28: qty 100

## 2019-07-28 MED ORDER — SODIUM CHLORIDE 0.9 % IV BOLUS: 500.0000 mL | Freq: Once | INTRAVENOUS | Status: DC

## 2019-07-28 NOTE — ED Notes (Signed)
Patient transported to CT 

## 2019-07-28 NOTE — H&P (Signed)
Date: 07/28/2019               Patient Name:  Victoria Lloyd MRN: [id number]  DOB: May 25, 1922 Age / Sex: 85 y.o., female   PCP: Patient, No Pcp Per         Medical Service: Internal Medicine Teaching Service         Attending Physician: Dr. Sid Falcon, MD    First Contact: Dr. Madilyn Fireman Pager: [phone]  Second Contact: Dr. Sherry Ruffing Pager: [phone]       After Hours (After 5p/  First Contact Pager: [phone]  weekends / holidays): Second Contact Pager: [phone]   Chief Complaint: Fever and AMS  History of Present Illness:  Hx per patient's son, EMS and medical records shows that Victoria Lloyd is a 85 yo F w/ a PMHx of HTN, CKD, CHF, pacemaker secondary to heart block presenting via EMS from a memory care facility w/ fever of 100.5, AMS and hypotension w/ systolics in the 123XX123. She is noncommunicative on exam. Son says she didn't have any infectious sx that he knows of besides her fever which they found in anticipation of her going to an appointment with her cardiologist. Per son she talks very little at baseline but not responding at all to questioning is perhaps a decrease from baseline.   Meds:  Current Meds  Medication Sig  . acetaminophen (TYLENOL) 325 MG tablet Take 1-2 tablets (325-650 mg total) by mouth every 4 (four) hours as needed for mild pain. (Patient taking differently: Take 325 mg by mouth every 4 (four) hours as needed for mild pain. )  . ALPRAZolam (XANAX) 0.25 MG tablet Take 0.5 tablets (0.125 mg total) by mouth at bedtime. (Patient taking differently: Take 0.25 mg by mouth at bedtime as needed (insomnia agitation). )  . aspirin 81 MG tablet Take 81 mg by mouth at bedtime.   Marland Kitchen atorvastatin (LIPITOR) 10 MG tablet Take 10 mg by mouth at bedtime.  . calcium-vitamin D (OSCAL WITH D) 500-200 MG-UNIT per tablet Take 1 tablet by mouth at bedtime.   . cholecalciferol (VITAMIN D) 1000 units tablet Take 1,000 Units by mouth at bedtime.  . cycloSPORINE (RESTASIS) 0.05 %  ophthalmic emulsion Place 1 drop into both eyes 2 (two) times daily as needed (dry eyes).   Marland Kitchen diclofenac sodium (VOLTAREN) 1 % GEL Apply 2 g topically 4 (four) times daily.  . Emollient (FLANDERS BUTTOCKS) OINT Apply 1 application topically See admin instructions. With each diaper change  . furosemide (LASIX) 20 MG tablet Take 2 tablets (40 mg total) by mouth daily.  . Melatonin 3 MG TABS Take 3 mg by mouth at bedtime as needed (sleep).  . metoprolol tartrate (LOPRESSOR) 25 MG tablet Take 1 tablet (25 mg total) by mouth 2 (two) times daily.  . Nutritional Supplements (NUTRITIONAL SHAKE PO) Take 1 each by mouth 2 (two) times daily between meals. Mighty Shake  . ondansetron (ZOFRAN-ODT) 4 MG disintegrating tablet Take 1 tablet (4 mg total) by mouth every 8 (eight) hours as needed for nausea or vomiting.  . potassium chloride (K-DUR) 10 MEQ tablet Take 1 tablet (10 mEq total) by mouth daily.  . risperiDONE (RISPERDAL) 1 MG/ML oral solution Take 1.25 mg by mouth at bedtime.  . senna (SENOKOT) 8.6 MG TABS tablet Take 2 tablets (17.2 mg total) by mouth daily.    Allergies: Allergies as of 07/28/2019 - Review Complete 07/28/2019  Allergen Reaction Noted  . Codeine Other (See Comments) 03/15/2011  .  Sulfa antibiotics Other (See Comments) 03/15/2011  . Lactose intolerance (gi) Nausea And Vomiting 06/03/2016  . Milk-related compounds Nausea And Vomiting 06/03/2016  . Other Other (See Comments) 08/19/2018   Past Medical History:  Diagnosis Date  . Chronic diastolic (congestive) heart failure ([place])   . CKD (chronic kidney disease), stage IV ([place])   . Complete heart block (HCC)    a. s/p MDT dual chamber PPM   . Hypertension   . Stroke ([place]) 02/01/2018   CT scan =>  Chronic right PCA distribution infarct with right occipital lobe    Family History: Mother w/ HTN, Father w/ MI  Social History: Lives in a memory care facility, family has been unable due visit due to COVID  Review of Systems: A  complete ROS was negative except as per HPI.   Physical Exam: Blood pressure 114/79, pulse 72, temperature 99.9 F (37.7 C), temperature source Rectal, resp. rate 20, height 5\' 3"  (1.6 m), weight 54.4 kg, SpO2 99 %.  Physical Exam  Constitutional: No distress.  HENT:  Head: Normocephalic.  Eyes: Pupils are equal, round, and reactive to light.  Neck: Normal range of motion. Neck supple.  No pain w/ flexion  Cardiovascular: Normal rate, regular rhythm, normal heart sounds and intact distal pulses.  No murmur heard. Pulmonary/Chest: Effort normal. No respiratory distress.  Abdominal: Soft. Bowel sounds are normal. She exhibits no distension.  Genitourinary:    Genitourinary Comments: No wounds on bottom   Neurological: She is alert. No cranial nerve deficit.  Not oriented to self, location, year or situation; weak w/ limited strength but equal bilaterally; responds to commands  Skin: Skin is warm and dry. No rash noted. She is not diaphoretic.  Nursing note and vitals reviewed.  Labs:  Results for orders placed or performed during the hospital encounter of 07/28/19 (from the past 24 hour(s))  Lactic acid, plasma     Status: None   Collection Time: 07/28/19 10:35 AM  Result Value Ref Range   Lactic Acid, Venous 1.6 0.5 - 1.9 mmol/L  Comprehensive metabolic panel     Status: Abnormal   Collection Time: 07/28/19 10:35 AM  Result Value Ref Range   Sodium 141 135 - 145 mmol/L   Potassium 2.9 (L) 3.5 - 5.1 mmol/L   Chloride 104 98 - 111 mmol/L   CO2 26 22 - 32 mmol/L   Glucose, Bld 157 (H) 70 - 99 mg/dL   BUN 26 (H) 8 - 23 mg/dL   Creatinine, Ser 1.09 (H) 0.44 - 1.00 mg/dL   Calcium 9.1 8.9 - 10.3 mg/dL   Total Protein 6.3 (L) 6.5 - 8.1 g/dL   Albumin 2.7 (L) 3.5 - 5.0 g/dL   AST 36 15 - 41 U/L   ALT 21 0 - 44 U/L   Alkaline Phosphatase 62 38 - 126 U/L   Total Bilirubin 0.7 0.3 - 1.2 mg/dL   GFR calc non Af Amer 43 (L) >60 mL/min   GFR calc Af Amer 50 (L) >60 mL/min   Anion  gap 11 5 - 15  APTT     Status: None   Collection Time: 07/28/19 10:35 AM  Result Value Ref Range   aPTT 29 24 - 36 seconds  Protime-INR     Status: Abnormal   Collection Time: 07/28/19 10:35 AM  Result Value Ref Range   Prothrombin Time 15.6 (H) 11.4 - 15.2 seconds   INR 1.3 (H) 0.8 - 1.2  Urinalysis, Routine w reflex microscopic  Status: Abnormal   Collection Time: 07/28/19  1:10 PM  Result Value Ref Range   Color, Urine YELLOW YELLOW   APPearance HAZY (A) CLEAR   Specific Gravity, Urine 1.008 1.005 - 1.030   pH 6.0 5.0 - 8.0   Glucose, UA NEGATIVE NEGATIVE mg/dL   Hgb urine dipstick SMALL (A) NEGATIVE   Bilirubin Urine NEGATIVE NEGATIVE   Ketones, ur NEGATIVE NEGATIVE mg/dL   Protein, ur NEGATIVE NEGATIVE mg/dL   Nitrite NEGATIVE NEGATIVE   Leukocytes,Ua LARGE (A) NEGATIVE   RBC / HPF 0-5 0 - 5 RBC/hpf   WBC, UA 6-10 0 - 5 WBC/hpf   Bacteria, UA MANY (A) NONE SEEN   Squamous Epithelial / LPF 0-5 0 - 5   Mucus PRESENT    Hyaline Casts, UA PRESENT   SARS Coronavirus 2 (Hospital order, Performed in [place] hospital lab) Nasopharyngeal Nasopharyngeal Swab     Status: None   Collection Time: 07/28/19  1:46 PM   Specimen: Nasopharyngeal Swab  Result Value Ref Range   SARS Coronavirus 2 NEGATIVE NEGATIVE  CBC with Differential/Platelet     Status: Abnormal   Collection Time: 07/28/19  2:00 PM  Result Value Ref Range   WBC 10.4 4.0 - 10.5 K/uL   RBC 3.95 3.87 - 5.11 MIL/uL   Hemoglobin 11.5 (L) 12.0 - 15.0 g/dL   HCT 34.7 (L) 36.0 - 46.0 %   MCV 87.8 80.0 - 100.0 fL   MCH 29.1 26.0 - 34.0 pg   MCHC 33.1 30.0 - 36.0 g/dL   RDW 15.0 11.5 - 15.5 %   Platelets 209 150 - 400 K/uL   nRBC 0.0 0.0 - 0.2 %   Neutrophils Relative % 73 %   Neutro Abs 7.6 1.7 - 7.7 K/uL   Lymphocytes Relative 15 %   Lymphs Abs 1.5 0.7 - 4.0 K/uL   Monocytes Relative 12 %   Monocytes Absolute 1.3 (H) 0.1 - 1.0 K/uL   Eosinophils Relative 0 %   Eosinophils Absolute 0.0 0.0 - 0.5 K/uL    Basophils Relative 0 %   Basophils Absolute 0.0 0.0 - 0.1 K/uL   Immature Granulocytes 0 %   Abs Immature Granulocytes 0.04 0.00 - 0.07 K/uL   EKG: personally reviewed my interpretation is NSR w/ LBBB  CXR: personally reviewed my interpretation is mild streaky right basilar opacity favoring atelectasis   CT Head: No acute intracranial abnormality, cerebral/cerebellar atrophy and small vessel ischemic change, remote right occipital lobe infarct, significant sinus disease  Assessment:  Hx per patient's son, EMS and medical records shows that Ms. Vespa is a 85 yo F w/ a PMHx of HTN, CKD, CHF, pacemaker secondary to heart block presenting via EMS from a memory care facility w/ fever of 100.5, AMS and hypotension w/ systolics in the 123XX123 whose presentation is consistent with sepsis.  Plan by Problem:  Active Problems: * Sepsis, possibly UTI, AMS: Fever 100.5 at memory care unit, 99.9 here. No leukocytosis, LA nl. CXR favors atelectasis. U/A showed bacteremia and leukocytes. CT head shows no acute intracranial abnormality. Blood/urine cultures pending. Received cefepime and vanc in ED. AMS likely secondary to infection from urinary tract and current mentation may not be far from baseline per son.  -continue broad spectrum antibiotics and IVF -fu blood cx, urine cx -trend fever curve -daily CBC w/ diff  *CKD: Cr 1.09 on admission around baseline  -monitor w/ daily BMP   *3rd degree AV block s/p pacemaker: Currently paced in the  60-70s.   *Hypokalemia: Chronic, on K-DUR outpatient, K 2.9, repleting -daily BMP, replete as needed  *Chronic conditions -currently holding some home meds  --atorvastatin, aspirin, lasix, metoprolol and risperdal   Consults: None   Dispo: Admit patient to Inpatient with expected length of stay greater than 2 midnights.  Signed: Al Decant, MD 07/28/2019, 4:34 PM  Pager: [phone]

## 2019-07-28 NOTE — ED Provider Notes (Signed)
[facility] EMERGENCY DEPARTMENT Provider Note   CSN: [id number] Arrival date & time: 07/28/19  1014     History   Chief Complaint Chief Complaint  Patient presents with  . Hypotension  . Fever    HPI Victoria Lloyd is a 85 y.o. female.     The history is provided by the EMS personnel and medical records. No language interpreter was used.  Fever    85 year old female with history of hypertension, CKD, CHF, pacemaker secondary to heart block, brought here via EMS for concerns of sepsis.  Patient lives at [facility].  Per EMS, patient was found to be febrile with a temperature of 101.5 as well as hypotensive with initial systolic blood pressures in the 80s.  Patient received IV fluid and brought here for further evaluation.  I am unable to obtain any history from patient therefore level 5 caveat applies.  Past Medical History:  Diagnosis Date  . Chronic diastolic (congestive) heart failure ([place])   . CKD (chronic kidney disease), stage IV ([place])   . Complete heart block (HCC)    a. s/p MDT dual chamber PPM   . Hypertension   . Stroke ([place]) 02/01/2018   CT scan =>  Chronic right PCA distribution infarct with right occipital lobe    Patient Active Problem List   Diagnosis Date Noted  . Acute encephalopathy 08/19/2018  . Fall at home, initial encounter 02/02/2018  . Fever 02/01/2018  . UTI (lower urinary tract infection)   . Anxiety 06/03/2016  . SOB (shortness of breath) 06/03/2016  . Elevated troponin 06/03/2016  . CHF exacerbation ([place]) 06/03/2016  . Acute on chronic diastolic (congestive) heart failure ([place])   . Syncope 04/24/2016  . Pacemaker- [employer] impalnted 08/04/14 08/06/2014  . AV block, 3rd degree (HCC) 08/03/2014  . Acute CHF- secondary to bradycardia on admision (Nl LVF by echo) 08/03/2014  . CKD (chronic kidney disease) stage 4, GFR 15-29 ml/min (HCC) 08/03/2014  . Bradycardia 08/03/2014  . Hypertension 03/15/2011     Past Surgical History:  Procedure Laterality Date  . CATARACT EXTRACTION    . PERMANENT PACEMAKER INSERTION N/A 08/04/2014   Procedure: PERMANENT PACEMAKER INSERTION;  Surgeon: Deboraha Sprang, MD;  Location: [facility] CATH LAB;  Service: Cardiovascular;  Laterality: N/A;     OB History   No obstetric history on file.      Home Medications    Prior to Admission medications   Medication Sig Start Date End Date Taking? Authorizing Provider  acetaminophen (TYLENOL) 325 MG tablet Take 1-2 tablets (325-650 mg total) by mouth every 4 (four) hours as needed for mild pain. Patient taking differently: Take 650 mg by mouth every 4 (four) hours as needed for mild pain.  08/06/14   Erlene Quan, PA-C  ALPRAZolam [name]) 0.25 MG tablet Take 0.5 tablets (0.125 mg total) by mouth at bedtime. Patient taking differently: Take 0.25 mg by mouth daily at 6 (six) AM.  02/03/18   Raiford Noble Latif, DO  aspirin 81 MG tablet Take 81 mg by mouth at bedtime.     [provider]  atorvastatin (LIPITOR) 10 MG tablet Take 10 mg by mouth at bedtime. 08/05/18   [provider]  calcium-vitamin D (OSCAL WITH D) 500-200 MG-UNIT per tablet Take 1 tablet by mouth at bedtime.     [provider]  cholecalciferol (VITAMIN D) 1000 units tablet Take 1,000 Units by mouth at bedtime.    [provider]  cycloSPORINE (RESTASIS) 0.05 % ophthalmic emulsion Place 1 drop into both eyes 2 (two) times daily as needed (dry eyes).     [provider]  dextromethorphan-guaiFENesin (MUCINEX DM) 30-600 MG 12hr tablet Take 1 tablet by mouth 2 (two) times daily. Patient not taking: Reported on 02/01/2018 06/06/16   Cristal Ford, DO  diclofenac sodium (VOLTAREN) 1 % GEL Apply 2 g topically 4 (four) times daily. 02/03/18   Raiford Noble Latif, DO  dicyclomine (BENTYL) 10 MG capsule Take 10 mg by mouth at bedtime.  11/27/17   [provider]  Emollient (FLANDERS BUTTOCKS) OINT Apply 1 application  topically See admin instructions. With each diaper change    [provider]  furosemide (LASIX) 20 MG tablet Take 2 tablets (40 mg total) by mouth daily. 08/22/18   Thurnell Lose, MD  lidocaine (LIDODERM) 5 % Place 1 patch onto the skin daily. Patient not taking: Reported on 02/01/2018 06/06/16   Cristal Ford, DO  Melatonin 3 MG TABS Take 3 mg by mouth at bedtime as needed (sleep).    [provider]  metoprolol tartrate (LOPRESSOR) 25 MG tablet Take 1 tablet (25 mg total) by mouth 2 (two) times daily. 08/22/18 08/22/19  Thurnell Lose, MD  ondansetron (ZOFRAN-ODT) 4 MG disintegrating tablet Take 1 tablet (4 mg total) by mouth every 8 (eight) hours as needed for nausea or vomiting. 04/29/16   Hollice Gong, Mir Mohammed, MD  potassium chloride (K-DUR) 10 MEQ tablet Take 1 tablet (10 mEq total) by mouth daily. 08/22/18   Thurnell Lose, MD  risperiDONE (RISPERDAL) 1 MG/ML oral solution Take 1.25 mg by mouth at bedtime. 08/15/18   [provider]  senna (SENOKOT) 8.6 MG TABS tablet Take 2 tablets (17.2 mg total) by mouth daily. 04/29/16   Hollice Gong, Mir Earlie Server, MD  traMADol (ULTRAM) 50 MG tablet Take 1 tablet (50 mg total) by mouth every 6 (six) hours as needed for severe pain. Patient not taking: Reported on 08/19/2018 02/03/18   Kerney Elbe, DO    Family History Family History  Problem Relation Age of Onset  . Hypertension Mother   . Heart attack Father     Social History Social History   Tobacco Use  . Smoking status: Never Smoker  . Smokeless tobacco: Never Used  Substance Use Topics  . Alcohol use: No  . Drug use: No     Allergies   Codeine, Sulfa antibiotics, Lactose intolerance (gi), Milk-related compounds, and Other   Review of Systems Review of Systems  Unable to perform ROS: Mental status change  Constitutional: Positive for fever.     Physical Exam Updated Vital Signs BP (!) 116/58 (BP Location: Right Arm)   Pulse 64   Temp  99.9 F (37.7 C) (Rectal)   Resp (!) 21   Ht 5\' 3"  (1.6 m)   Wt 54.4 kg   SpO2 99%   BMI 21.26 kg/m   Physical Exam Vitals signs and nursing note reviewed.  Constitutional:      General: She is not in acute distress.    Appearance: She is well-developed.     Comments: Elderly female appears drowsy not responding to questions.   HENT:     Head: Atraumatic.  Eyes:     Conjunctiva/sclera: Conjunctivae normal.     Pupils: Pupils are equal, round, and reactive to light.  Neck:     Musculoskeletal: Normal range of motion and neck supple.  Cardiovascular:     Rate  and Rhythm: Rhythm irregular.     Heart sounds: No murmur.  Pulmonary:     Comments: Mildly tachypneic, decreased breath sounds with shallow breathing. Abdominal:     Palpations: Abdomen is soft.     Tenderness: There is no abdominal tenderness.  Skin:    Findings: No rash.  Neurological:     Mental Status: She is alert. She is disoriented.     GCS: GCS eye subscore is 3. GCS verbal subscore is 2. GCS motor subscore is 4.      ED Treatments / Results  Labs (all labs ordered are listed, but only abnormal results are displayed) Labs Reviewed  COMPREHENSIVE METABOLIC PANEL - Abnormal; Notable for the following components:      Result Value   Potassium 2.9 (*)    Glucose, Bld 157 (*)    BUN 26 (*)    Creatinine, Ser 1.09 (*)    Total Protein 6.3 (*)    Albumin 2.7 (*)    GFR calc non Af Amer 43 (*)    GFR calc Af Amer 50 (*)    All other components within normal limits  PROTIME-INR - Abnormal; Notable for the following components:   Prothrombin Time 15.6 (*)    INR 1.3 (*)    All other components within normal limits  URINALYSIS, ROUTINE W REFLEX MICROSCOPIC - Abnormal; Notable for the following components:   APPearance HAZY (*)    Hgb urine dipstick SMALL (*)    Leukocytes,Ua LARGE (*)    Bacteria, UA MANY (*)    All other components within normal limits  CBC WITH DIFFERENTIAL/PLATELET - Abnormal;  Notable for the following components:   Hemoglobin 11.5 (*)    HCT 34.7 (*)    Monocytes Absolute 1.3 (*)    All other components within normal limits  CULTURE, BLOOD (ROUTINE X 2)  CULTURE, BLOOD (ROUTINE X 2)  URINE CULTURE  SARS CORONAVIRUS 2 (HOSPITAL ORDER, [place])  LACTIC ACID, PLASMA  APTT  LACTIC ACID, PLASMA  CBC WITH DIFFERENTIAL/PLATELET    EKG EKG Interpretation  Date/Time:  Thursday July 28 2019 10:28:48 EDT Ventricular Rate:  64 PR Interval:    QRS Duration: 190 QT Interval:  509 QTC Calculation: 526 R Axis:   -63 Text Interpretation:  Sinus rhythm Left bundle branch block Confirmed by Gerlene Fee [phone]) on 07/28/2019 10:36:13 AM   Radiology Dg Chest Port 1 View  Result Date: 07/28/2019 CLINICAL DATA:  Sepsis, fever EXAM: PORTABLE CHEST 1 VIEW COMPARISON:  08/19/2018 FINDINGS: Left-sided implanted cardiac device. Stable cardiomediastinal contours. Persistent elevation of the right hemidiaphragm. Minimal linear opacity in the right lung base, favors atelectasis. Lungs are otherwise clear. No pleural effusion or pneumothorax. IMPRESSION: Persistent elevation of the right hemidiaphragm with mild streaky right basilar opacity, favor atelectasis. Electronically Signed   By: Davina Poke M.D.   On: 07/28/2019 10:58    Procedures Procedures (including critical care time)  Medications Ordered in ED Medications  ceFEPIme (MAXIPIME) 2 g in sodium chloride 0.9 % 100 mL IVPB (has no administration in time range)  vancomycin (VANCOCIN) IVPB 1000 mg/200 mL premix (has no administration in time range)  potassium chloride 10 mEq in 100 mL IVPB (has no administration in time range)  sodium chloride 0.9 % bolus 500 mL (500 mLs Intravenous New Bag/Given 07/28/19 1114)  ceFEPIme (MAXIPIME) 2 g in sodium chloride 0.9 % 100 mL IVPB (2 g Intravenous New Bag/Given 07/28/19 1116)  metroNIDAZOLE (FLAGYL) IVPB  500 mg (500 mg Intravenous New Bag/Given  07/28/19 1115)  vancomycin (VANCOCIN) IVPB 1000 mg/200 mL premix (1,000 mg Intravenous New Bag/Given 07/28/19 1115)     Initial Impression / Assessment and Plan / ED Course  I have reviewed the triage vital signs and the nursing notes.  Pertinent labs & imaging results that were available during my care of the patient were reviewed by me and considered in my medical decision making (see chart for details).        BP (!) 127/52   Pulse 60   Temp 99.9 F (37.7 C) (Rectal)   Resp (!) 9   Ht 5\' 3"  (1.6 m)   Wt 54.4 kg   SpO2 99%   BMI 21.26 kg/m    Final Clinical Impressions(s) / ED Diagnoses   Final diagnoses:  Delirium  Fever of unknown origin (FUO)  Hypokalemia    ED Discharge Orders    None     10:41 AM Pt brought here for fever and hypotension.  Coming from memory care unit.  Code sepsis initiated, broad spectrum abx given.  IVF given.  Care discussed with DR. Bero who reached out to family member to inquire additional info.    2:47 PM I did reached to [facility] unit and spoke with the staff.  Pt was febrile this AM at 100.4, and appears not at baseline.  No direct covid-19 sick contact.  Today her CXR with mild streaky right basilar opacity without overt pna.  UA showing large leukocytes and many bacterial with 6-10 WBC.  Urine culture sent.  Normal WBC and normal lactic acid.  Pt received broad spectrum abx.  Pt is hypokalemic with K+ 2.9.  Cannot tolerates PO at this time, IV potassium given.  Plan to consult for admission of AMS and hypokalemia.  Low suspicion of meningitis.  3:16 PM Appreciate consultation from INternal Medicine resident who agrees to see and will admit for further care.     Domenic Moras, PA-C 07/28/19 1518    Maudie Flakes, MD 07/29/19 [phone]

## 2019-07-28 NOTE — Progress Notes (Signed)
Pharmacy Antibiotic Note  Victoria Lloyd is a 85 y.o. female admitted on 07/28/2019 with sepsis. Pharmacy has been consulted for vancomycin and cefepime dosing. Pt with Tmax 99.9 and WBC is still pending. Scr is 1.09 and lactic acid is <2.   Plan: Vancomycin 1gm IV Q48H Cefepime 2gm IV Q24H F/u renal fxn, C&S, clinical status and peak/trough at SS  Height: 5\' 3"  (160 cm) Weight: 120 lb (54.4 kg) IBW/kg (Calculated) : 52.4  Temp (24hrs), Avg:99.9 F (37.7 C), Min:99.9 F (37.7 C), Max:99.9 F (37.7 C)  Recent Labs  Lab 07/28/19 1035  CREATININE 1.09*  LATICACIDVEN 1.6    Estimated Creatinine Clearance: 25.5 mL/min (A) (by C-G formula based on SCr of 1.09 mg/dL (H)).    Allergies  Allergen Reactions  . Codeine Other (See Comments)    Unknown; patient cannot recall the reaction  . Sulfa Antibiotics Other (See Comments)    Unknown; patient cannot recall the reaction  . Lactose Intolerance (Gi) Nausea And Vomiting  . Milk-Related Compounds Nausea And Vomiting  . Other Other (See Comments)    "Seafood"- unknown reaction, listed on MAR.    Antimicrobials this admission: Vanc 9/3>> Cefepime 9/3>> Flagyl x 1 9/3  Dose adjustments this admission: N/A  Microbiology results: Pending  Thank you for allowing pharmacy to be a part of this patient's care.  Bryahna Lesko, Rande Lawman 07/28/2019 10:39 AM

## 2019-07-28 NOTE — ED Triage Notes (Signed)
Pt BIB [facility] from [facility]. Per EMS pt febrile at facility with a respiratory rate in the 40s. Pt paced at 70 bpm. Pt BP upon EMS arrival 80/40. Pt given 1000 cc bolus and BP went up to 140.

## 2019-07-28 NOTE — ED Notes (Addendum)
ED TO INPATIENT HANDOFF REPORT  ED Nurse Name and Phone #: Thurmond Butts [place] Name/Age/Gender Victoria Lloyd 85 y.o. female Room/Bed: 030C/030C  Code Status   Code Status: Full Code  Home/SNF/Other Skilled nursing facility  Is this baseline? No   Triage Complete: Triage complete  Chief Complaint FEVER, SEPSIS  Triage Note Pt BIB [facility] from [facility]. Per EMS pt febrile at facility with a respiratory rate in the 40s. Pt paced at 70 bpm. Pt BP upon EMS arrival 80/40. Pt given 1000 cc bolus and BP went up to 140.   Allergies Allergies  Allergen Reactions  . Codeine Other (See Comments)    Unknown; patient cannot recall the reaction  . Sulfa Antibiotics Other (See Comments)    Unknown; patient cannot recall the reaction  . Lactose Intolerance (Gi) Nausea And Vomiting  . Milk-Related Compounds Nausea And Vomiting  . Other Other (See Comments)    "Seafood"- unknown reaction, listed on MAR.    Level of Care/Admitting Diagnosis ED Disposition    ED Disposition Condition Comment   Admit  Hospital Area: [place] [[id number]]  Level of Care: Telemetry Cardiac [103]  Covid Evaluation: Confirmed COVID Negative  Diagnosis: Altered mental status [780.97.ICD-9-CM]  Admitting Physician: Sid Falcon [phone]  Attending Physician: Sid Falcon [phone]  PT Class (Do Not Modify): Observation [104]  PT Acc Code (Do Not Modify): Observation [10022]       B Medical/Surgery History Past Medical History:  Diagnosis Date  . Chronic diastolic (congestive) heart failure ([place])   . CKD (chronic kidney disease), stage IV ([place])   . Complete heart block (HCC)    a. s/p MDT dual chamber PPM   . Hypertension   . Stroke ([place]) 02/01/2018   CT scan =>  Chronic right PCA distribution infarct with right occipital lobe   Past Surgical History:  Procedure Laterality Date  . CATARACT EXTRACTION    . PERMANENT PACEMAKER INSERTION N/A 08/04/2014   Procedure: PERMANENT PACEMAKER INSERTION;  Surgeon: Deboraha Sprang, MD;  Location: [facility] CATH LAB;  Service: Cardiovascular;  Laterality: N/A;     A IV Location/Drains/Wounds Patient Lines/Drains/Airways Status   Active Line/Drains/Airways    Name:   Placement date:   Placement time:   Site:   Days:   Peripheral IV 08/19/18 Left;Upper Arm   08/19/18    1924    Arm   343   Peripheral IV 07/28/19 Distal;Right Wrist   07/28/19    1019    Wrist   less than 1   Peripheral IV 07/28/19 Left Hand   07/28/19    1020    Hand   less than 1   External Urinary Catheter   08/19/18    2124    -   343   Incision (Closed) 08/04/14 Chest Left   08/04/14    1756     1819          Intake/Output Last 24 hours  Intake/Output Summary (Last 24 hours) at 07/28/2019 1712 Last data filed at 07/28/2019 1509 Gross per 24 hour  Intake 1000 ml  Output -  Net 1000 ml    Labs/Imaging Results for orders placed or performed during the hospital encounter of 07/28/19 (from the past 48 hour(s))  Lactic acid, plasma     Status: None   Collection Time: 07/28/19 10:35 AM  Result Value Ref Range   Lactic Acid, Venous 1.6 0.5 - 1.9 mmol/L  Comment: Performed at [facility], [place] [address] [place], [place] [address]  Comprehensive metabolic panel     Status: Abnormal   Collection Time: 07/28/19 10:35 AM  Result Value Ref Range   Sodium 141 135 - 145 mmol/L   Potassium 2.9 (L) 3.5 - 5.1 mmol/L   Chloride 104 98 - 111 mmol/L   CO2 26 22 - 32 mmol/L   Glucose, Bld 157 (H) 70 - 99 mg/dL   BUN 26 (H) 8 - 23 mg/dL   Creatinine, Ser 1.09 (H) 0.44 - 1.00 mg/dL   Calcium 9.1 8.9 - 10.3 mg/dL   Total Protein 6.3 (L) 6.5 - 8.1 g/dL   Albumin 2.7 (L) 3.5 - 5.0 g/dL   AST 36 15 - 41 U/L   ALT 21 0 - 44 U/L   Alkaline Phosphatase 62 38 - 126 U/L   Total Bilirubin 0.7 0.3 - 1.2 mg/dL   GFR calc non Af Amer 43 (L) >60 mL/min   GFR calc Af Amer 50 (L) >60 mL/min   Anion gap 11 5 - 15    Comment: Performed at [facility], [place] [address] [place], [place] [address]  APTT     Status: None   Collection Time: 07/28/19 10:35 AM  Result Value Ref Range   aPTT 29 24 - 36 seconds    Comment: Performed at [place] [address] [place], [place] [address]  Protime-INR     Status: Abnormal   Collection Time: 07/28/19 10:35 AM  Result Value Ref Range   Prothrombin Time 15.6 (H) 11.4 - 15.2 seconds   INR 1.3 (H) 0.8 - 1.2    Comment: (NOTE) INR goal varies based on device and disease states. Performed at [facility], [place] [address] [place], [place] [address]   Urinalysis, Routine w reflex microscopic     Status: Abnormal   Collection Time: 07/28/19  1:10 PM  Result Value Ref Range   Color, Urine YELLOW YELLOW   APPearance HAZY (A) CLEAR   Specific Gravity, Urine 1.008 1.005 - 1.030   pH 6.0 5.0 - 8.0   Glucose, UA NEGATIVE NEGATIVE mg/dL   Hgb urine dipstick SMALL (A) NEGATIVE   Bilirubin Urine NEGATIVE NEGATIVE   Ketones, ur NEGATIVE NEGATIVE mg/dL   Protein, ur NEGATIVE NEGATIVE mg/dL   Nitrite NEGATIVE NEGATIVE   Leukocytes,Ua LARGE (A) NEGATIVE   RBC / HPF 0-5 0 - 5 RBC/hpf   WBC, UA 6-10 0 - 5 WBC/hpf   Bacteria, UA MANY (A) NONE SEEN   Squamous Epithelial / LPF 0-5 0 - 5   Mucus PRESENT    Hyaline Casts, UA PRESENT     Comment: Performed at [facility], [place] [address], [place],  [address]  SARS Coronavirus 2 [facility] order, Performed in [facility] hospital lab) Nasopharyngeal Nasopharyngeal Swab     Status: None   Collection Time: 07/28/19  1:46 PM   Specimen: Nasopharyngeal Swab  Result Value Ref Range   SARS Coronavirus 2 NEGATIVE NEGATIVE    Comment: (NOTE) If result is NEGATIVE SARS-CoV-2 target nucleic acids are NOT DETECTED. The SARS-CoV-2 RNA is generally detectable in upper and lower  respiratory specimens during the acute phase of infection. The lowest  concentration of SARS-CoV-2 viral copies this assay can detect is 250  copies / mL. A  negative result does not preclude SARS-CoV-2 infection  and should not be used as the sole basis  for treatment or other  patient management decisions.  A negative result may occur with  improper specimen collection / handling, submission of specimen other  than nasopharyngeal swab, presence of viral mutation(s) within the  areas targeted by this assay, and inadequate number of viral copies  (<250 copies / mL). A negative result must be combined with clinical  observations, patient history, and epidemiological information. If result is POSITIVE SARS-CoV-2 target nucleic acids are DETECTED. The SARS-CoV-2 RNA is generally detectable in upper and lower  respiratory specimens dur ing the acute phase of infection.  Positive  results are indicative of active infection with SARS-CoV-2.  Clinical  correlation with patient history and other diagnostic information is  necessary to determine patient infection status.  Positive results do  not rule out bacterial infection or co-infection with other viruses. If result is PRESUMPTIVE POSTIVE SARS-CoV-2 nucleic acids MAY BE PRESENT.   A presumptive positive result was obtained on the submitted specimen  and confirmed on repeat testing.  While 2019 novel coronavirus  (SARS-CoV-2) nucleic acids may be present in the submitted sample  additional confirmatory testing may be necessary for epidemiological  and / or clinical management purposes  to differentiate between  SARS-CoV-2 and other Sarbecovirus currently known to infect humans.  If clinically indicated additional testing with an alternate test  methodology [id number]) is advised. The SARS-CoV-2 RNA is generally  detectable in upper and lower respiratory sp ecimens during the acute  phase of infection. The expected result is Negative. Fact Sheet for Patients:  StrictlyIdeas.no Fact Sheet for Healthcare Providers: BankingDealers.co.za This test is not  yet approved or cleared by the [place] FDA and has been authorized for detection and/or diagnosis of SARS-CoV-2 by FDA under an Emergency Use Authorization (EUA).  This EUA will remain in effect (meaning this test can be used) for the duration of the COVID-19 declaration under Section 564(b)(1) of the Act, 21 U.S.C. section 360bbb-3(b)(1), unless the authorization is terminated or revoked sooner. Performed at [facility], [place] [address], [place], [place] [address]   CBC with Differential/Platelet     Status: Abnormal   Collection Time: 07/28/19  2:00 PM  Result Value Ref Range   WBC 10.4 4.0 - 10.5 K/uL   RBC 3.95 3.87 - 5.11 MIL/uL   Hemoglobin 11.5 (L) 12.0 - 15.0 g/dL   HCT 34.7 (L) 36.0 - 46.0 %   MCV 87.8 80.0 - 100.0 fL   MCH 29.1 26.0 - 34.0 pg   MCHC 33.1 30.0 - 36.0 g/dL   RDW 15.0 11.5 - 15.5 %   Platelets 209 150 - 400 K/uL   nRBC 0.0 0.0 - 0.2 %   Neutrophils Relative % 73 %   Neutro Abs 7.6 1.7 - 7.7 K/uL   Lymphocytes Relative 15 %   Lymphs Abs 1.5 0.7 - 4.0 K/uL   Monocytes Relative 12 %   Monocytes Absolute 1.3 (H) 0.1 - 1.0 K/uL   Eosinophils Relative 0 %   Eosinophils Absolute 0.0 0.0 - 0.5 K/uL   Basophils Relative 0 %   Basophils Absolute 0.0 0.0 - 0.1 K/uL   Immature Granulocytes 0 %   Abs Immature Granulocytes 0.04 0.00 - 0.07 K/uL    Comment: Performed at [facility], [address], [place], [place] [address]   Dg Chest Port 1 View  Result Date: 07/28/2019 CLINICAL DATA:  Sepsis, fever EXAM: PORTABLE CHEST 1 VIEW COMPARISON:  08/19/2018 FINDINGS: Left-sided implanted cardiac device. Stable cardiomediastinal  contours. Persistent elevation of the right hemidiaphragm. Minimal linear opacity in the right lung base, favors atelectasis. Lungs are otherwise clear. No pleural effusion or pneumothorax. IMPRESSION: Persistent elevation of the right hemidiaphragm with mild streaky right basilar opacity, favor atelectasis. Electronically Signed    By: Davina Poke M.D.   On: 07/28/2019 10:58    Pending Labs Unresulted Labs (From admission, onward)    Start     Ordered   07/29/19 [id number]  Basic metabolic panel  Tomorrow morning,   R     07/28/19 1633   07/29/19 0500  CBC  Tomorrow morning,   R     07/28/19 1633   07/28/19 1629  Urine culture  Once,   STAT     07/28/19 1633   07/28/19 1035  Lactic acid, plasma  Now then every 2 hours,   STAT     07/28/19 1036   07/28/19 1035  CBC WITH DIFFERENTIAL  ONCE - STAT,   STAT     07/28/19 1036   07/28/19 1035  Blood Culture (routine x 2)  BLOOD CULTURE X 2,   STAT     07/28/19 1036   07/28/19 1035  Urine culture  ONCE - STAT,   STAT     07/28/19 1036          Vitals/Pain Today's Vitals   07/28/19 1545 07/28/19 1600 07/28/19 1615 07/28/19 1630  BP: (!) 126/56 117/79 114/79 120/65  Pulse: 62 63 72 75  Resp: 15 (!) 24 20 (!) 21  Temp:      TempSrc:      SpO2: 99% 99% 99% 99%  Weight:      Height:      PainSc:        Isolation Precautions No active isolations  Medications Medications  ceFEPIme (MAXIPIME) 2 g in sodium chloride 0.9 % 100 mL IVPB (has no administration in time range)  vancomycin (VANCOCIN) IVPB 1000 mg/200 mL premix (has no administration in time range)  potassium chloride SA (K-DUR) CR tablet 40 mEq (has no administration in time range)  [place] Buttocks OINT 1 application (has no administration in time range)  cycloSPORINE (RESTASIS) 0.05 % ophthalmic emulsion 1 drop (has no administration in time range)  enoxaparin (LOVENOX) injection 30 mg (has no administration in time range)  acetaminophen (TYLENOL) tablet 650 mg (has no administration in time range)    Or  acetaminophen (TYLENOL) suppository 650 mg (has no administration in time range)  senna-docusate (Senokot-S) tablet 1 tablet (has no administration in time range)  sodium chloride 0.9 % bolus 500 mL (0 mLs Intravenous Stopped 07/28/19 1339)  ceFEPIme (MAXIPIME) 2 g in sodium chloride 0.9 % 100  mL IVPB (0 g Intravenous Stopped 07/28/19 1340)  metroNIDAZOLE (FLAGYL) IVPB 500 mg (0 mg Intravenous Stopped 07/28/19 1340)  vancomycin (VANCOCIN) IVPB 1000 mg/200 mL premix (0 mg Intravenous Stopped 07/28/19 1340)  potassium chloride 10 mEq in 100 mL IVPB (0 mEq Intravenous Stopped 07/28/19 1509)    Mobility manual wheelchair High fall risk   Focused Assessments    R Recommendations: See Admitting Provider Note  Report given to: Barnie Mort RN  Additional Notes:

## 2019-07-29 DIAGNOSIS — Z882 Allergy status to sulfonamides status: Secondary | ICD-10-CM | POA: Diagnosis not present

## 2019-07-29 DIAGNOSIS — Z950 Presence of cardiac pacemaker: Secondary | ICD-10-CM

## 2019-07-29 DIAGNOSIS — R4182 Altered mental status, unspecified: Secondary | ICD-10-CM | POA: Diagnosis not present

## 2019-07-29 DIAGNOSIS — Z20828 Contact with and (suspected) exposure to other viral communicable diseases: Secondary | ICD-10-CM | POA: Diagnosis present

## 2019-07-29 DIAGNOSIS — F0390 Unspecified dementia without behavioral disturbance: Secondary | ICD-10-CM | POA: Diagnosis present

## 2019-07-29 DIAGNOSIS — A419 Sepsis, unspecified organism: Secondary | ICD-10-CM | POA: Diagnosis not present

## 2019-07-29 DIAGNOSIS — I442 Atrioventricular block, complete: Secondary | ICD-10-CM | POA: Diagnosis present

## 2019-07-29 DIAGNOSIS — W19XXXA Unspecified fall, initial encounter: Secondary | ICD-10-CM | POA: Diagnosis present

## 2019-07-29 DIAGNOSIS — Z7982 Long term (current) use of aspirin: Secondary | ICD-10-CM | POA: Diagnosis not present

## 2019-07-29 DIAGNOSIS — Z79899 Other long term (current) drug therapy: Secondary | ICD-10-CM

## 2019-07-29 DIAGNOSIS — R1013 Epigastric pain: Secondary | ICD-10-CM | POA: Diagnosis not present

## 2019-07-29 DIAGNOSIS — E876 Hypokalemia: Secondary | ICD-10-CM | POA: Diagnosis present

## 2019-07-29 DIAGNOSIS — R339 Retention of urine, unspecified: Secondary | ICD-10-CM | POA: Diagnosis not present

## 2019-07-29 DIAGNOSIS — I130 Hypertensive heart and chronic kidney disease with heart failure and stage 1 through stage 4 chronic kidney disease, or unspecified chronic kidney disease: Secondary | ICD-10-CM

## 2019-07-29 DIAGNOSIS — I447 Left bundle-branch block, unspecified: Secondary | ICD-10-CM | POA: Diagnosis present

## 2019-07-29 DIAGNOSIS — I5032 Chronic diastolic (congestive) heart failure: Secondary | ICD-10-CM

## 2019-07-29 DIAGNOSIS — N390 Urinary tract infection, site not specified: Secondary | ICD-10-CM | POA: Diagnosis present

## 2019-07-29 DIAGNOSIS — K219 Gastro-esophageal reflux disease without esophagitis: Secondary | ICD-10-CM | POA: Diagnosis present

## 2019-07-29 DIAGNOSIS — I959 Hypotension, unspecified: Secondary | ICD-10-CM | POA: Diagnosis present

## 2019-07-29 DIAGNOSIS — R509 Fever, unspecified: Secondary | ICD-10-CM | POA: Diagnosis present

## 2019-07-29 DIAGNOSIS — M79605 Pain in left leg: Secondary | ICD-10-CM | POA: Diagnosis not present

## 2019-07-29 DIAGNOSIS — X58XXXA Exposure to other specified factors, initial encounter: Secondary | ICD-10-CM | POA: Diagnosis not present

## 2019-07-29 DIAGNOSIS — Z8249 Family history of ischemic heart disease and other diseases of the circulatory system: Secondary | ICD-10-CM | POA: Diagnosis not present

## 2019-07-29 DIAGNOSIS — M79604 Pain in right leg: Secondary | ICD-10-CM | POA: Diagnosis not present

## 2019-07-29 DIAGNOSIS — E739 Lactose intolerance, unspecified: Secondary | ICD-10-CM | POA: Diagnosis present

## 2019-07-29 DIAGNOSIS — Z885 Allergy status to narcotic agent status: Secondary | ICD-10-CM | POA: Diagnosis not present

## 2019-07-29 DIAGNOSIS — Z8673 Personal history of transient ischemic attack (TIA), and cerebral infarction without residual deficits: Secondary | ICD-10-CM | POA: Diagnosis not present

## 2019-07-29 DIAGNOSIS — I459 Conduction disorder, unspecified: Secondary | ICD-10-CM

## 2019-07-29 DIAGNOSIS — N189 Chronic kidney disease, unspecified: Secondary | ICD-10-CM

## 2019-07-29 DIAGNOSIS — Z91013 Allergy to seafood: Secondary | ICD-10-CM | POA: Diagnosis not present

## 2019-07-29 DIAGNOSIS — Z23 Encounter for immunization: Secondary | ICD-10-CM | POA: Diagnosis present

## 2019-07-29 DIAGNOSIS — R402342 Coma scale, best motor response, flexion withdrawal, at arrival to emergency department: Secondary | ICD-10-CM | POA: Diagnosis present

## 2019-07-29 DIAGNOSIS — R402222 Coma scale, best verbal response, incomprehensible words, at arrival to emergency department: Secondary | ICD-10-CM | POA: Diagnosis present

## 2019-07-29 DIAGNOSIS — N184 Chronic kidney disease, stage 4 (severe): Secondary | ICD-10-CM | POA: Diagnosis present

## 2019-07-29 DIAGNOSIS — R402132 Coma scale, eyes open, to sound, at arrival to emergency department: Secondary | ICD-10-CM | POA: Diagnosis present

## 2019-07-29 DIAGNOSIS — S72142A Displaced intertrochanteric fracture of left femur, initial encounter for closed fracture: Secondary | ICD-10-CM | POA: Diagnosis present

## 2019-07-29 LAB — CBC
HCT: 34.5 % — ABNORMAL LOW (ref 36.0–46.0)
Hemoglobin: 11.3 g/dL — ABNORMAL LOW (ref 12.0–15.0)
MCH: 28.8 pg (ref 26.0–34.0)
MCHC: 32.8 g/dL (ref 30.0–36.0)
MCV: 88 fL (ref 80.0–100.0)
Platelets: 199 10*3/uL (ref 150–400)
RBC: 3.92 MIL/uL (ref 3.87–5.11)
RDW: 14.9 % (ref 11.5–15.5)
WBC: 8.8 10*3/uL (ref 4.0–10.5)
nRBC: 0 % (ref 0.0–0.2)

## 2019-07-29 LAB — BASIC METABOLIC PANEL
Anion gap: 13 (ref 5–15)
BUN: 24 mg/dL — ABNORMAL HIGH (ref 8–23)
CO2: 24 mmol/L (ref 22–32)
Calcium: 8.1 mg/dL — ABNORMAL LOW (ref 8.9–10.3)
Chloride: 106 mmol/L (ref 98–111)
Creatinine, Ser: 0.95 mg/dL (ref 0.44–1.00)
GFR calc Af Amer: 59 mL/min — ABNORMAL LOW (ref >60)
GFR calc non Af Amer: 51 mL/min — ABNORMAL LOW (ref >60)
Glucose, Bld: 117 mg/dL — ABNORMAL HIGH (ref 70–99)
Potassium: 3.3 mmol/L — ABNORMAL LOW (ref 3.5–5.1)
Sodium: 143 mmol/L (ref 135–145)

## 2019-07-29 LAB — LACTIC ACID, PLASMA: Lactic Acid, Venous: 1.2 mmol/L (ref 0.5–1.9)

## 2019-07-29 LAB — URINE CULTURE: Culture: <10000 — AB

## 2019-07-29 MED ORDER — METOPROLOL TARTRATE 25 MG PO TABS
12.5000 mg | ORAL_TABLET | Freq: Two times a day (BID) | ORAL | Status: DC
  Administered 2019-07-29 – 2019-08-09 (×21): 12.5 mg via ORAL
  Filled 2019-07-29 (×23): qty 1

## 2019-07-29 MED ORDER — POTASSIUM CHLORIDE 10 MEQ/100ML IV SOLN
10.0000 meq | INTRAVENOUS | Status: AC
  Administered 2019-07-29 (×2): 10 meq via INTRAVENOUS
  Filled 2019-07-29 (×2): qty 100

## 2019-07-29 MED ORDER — POTASSIUM CHLORIDE 10 MEQ/100ML IV SOLN
10.0000 meq | INTRAVENOUS | Status: AC
  Administered 2019-07-29 (×4): 10 meq via INTRAVENOUS
  Filled 2019-07-29 (×4): qty 100

## 2019-07-29 MED ORDER — SODIUM CHLORIDE 0.9 % IV SOLN
1.0000 g | INTRAVENOUS | Status: DC
  Administered 2019-07-29 – 2019-07-30 (×2): 1 g via INTRAVENOUS
  Filled 2019-07-29 (×2): qty 1

## 2019-07-29 NOTE — Progress Notes (Signed)
Paged for elevated LA to 2.2 from 1.8. Went to check on patient who is non-verbal at baseline. She was resting comfortably with stable vital signs. Telemetry demonstrates ventricular paced rhythm. Lung sounds clear, no LE edem and making urine. She received fluid bolus earlier for sepsis.   - cont. Fluid resuscitation  - replete K   Victoria Lloyd A, DO 07/29/2019, 12:18 AM Pager: [phone]

## 2019-07-29 NOTE — Progress Notes (Signed)
   07/29/19 0821  MEWS Assessment  Is this an acute change? No

## 2019-07-29 NOTE — Progress Notes (Signed)
Subjective: Ms. Hannah Beat is laying in bed shaking this morning. She did not speak but nodded her head to questions. When asked if her name was Naeomi she said yes. When asked if she was in pain she said yes. She could not point to her pain.   Consults: none  Objective:  Vital signs in last 24 hours: Vitals:   07/29/19 0711 07/29/19 0822 07/29/19 0931 07/29/19 0944  BP: 130/74 (!) 129/59 137/80 125/80  Pulse: 89 89 (!) 101 (!) 101  Resp:      Temp:    98 F (36.7 C)  TempSrc:    Oral  SpO2:  95% 98% 97%  Weight:      Height:       Physical Exam  Constitutional:  Laying in bed shaking slightly   HENT:  Head: Normocephalic and atraumatic.  Cardiovascular: Regular rhythm and normal heart sounds. Exam reveals no friction rub.  No murmur heard. tachy  Pulmonary/Chest: Effort normal and breath sounds normal. No respiratory distress.  Abdominal: Soft. Bowel sounds are normal. She exhibits no distension. There is no abdominal tenderness.  Neurological: She is alert.  Nonverbal; nods her head yes and no to questions; follows commands; shaky  Skin: Skin is warm and dry. She is not diaphoretic. No erythema.  Nursing note and vitals reviewed.  Labs:   CMP Latest Ref Rng & Units 07/29/2019 07/28/2019 07/28/2019  Glucose 70 - 99 mg/dL 117(H) 126(H) 157(H)  BUN 8 - 23 mg/dL 24(H) 24(H) 26(H)  Creatinine 0.44 - 1.00 mg/dL 0.95 0.87 1.09(H)  Sodium 135 - 145 mmol/L 143 141 141  Potassium 3.5 - 5.1 mmol/L 3.3(L) 2.9(L) 2.9(L)  Chloride 98 - 111 mmol/L 106 105 104  CO2 22 - 32 mmol/L 24 24 26   Calcium 8.9 - 10.3 mg/dL 8.1(L) 8.4(L) 9.1  Total Protein 6.5 - 8.1 g/dL - - 6.3(L)  Total Bilirubin 0.3 - 1.2 mg/dL - - 0.7  Alkaline Phos 38 - 126 U/L - - 62  AST 15 - 41 U/L - - 36  ALT 0 - 44 U/L - - 21   CBC Latest Ref Rng & Units 07/29/2019 07/28/2019 08/20/2018  WBC 4.0 - 10.5 K/uL 8.8 10.4 5.7  Hemoglobin 12.0 - 15.0 g/dL 11.3(L) 11.5(L) 11.3(L)  Hematocrit 36.0 - 46.0 % 34.5(L) 34.7(L)  35.3(L)  Platelets 150 - 400 K/uL 199 209 227   Lactic acid: 1.6 --> 2.2 --> 1.2  Assessment/Plan:  Assessment: Ms. Kimbel is a 85 yo F w/ a PMHx of HTN, CKD, CHF, pacemaker secondary to heart block presenting via EMS from a memory care facility w/ fever of 100.5, AMS and hypotension afebrile on presentation w/ systolics in the [id number] w/ UA + for leuks and bacteria whose presentation is consistent w/ sepsis who has received IVF and broad spectrum antibiotics without fever and w/ resolution of hypotension with antibiotic narrowing today.   Plan: Active Problems:  *Sepsis, possibly UTI, AMS: Fever 100.5 at memory care unit, 99.9 here. No leukocytosis, LA nl. CXR favoring atelectasis. U/A showing bacteremia and leukocytosis. Head CT w/out acute abnormality. Blood/urine cx ordered. Got Vanc and cef in the ED. Patient has dementia at baseline, unclear how altered she truly is. Lactic bumped to 2.2 from 1.8 overnight w/ stable vitals, clear lungs, no LE edema and making urine so received fluid bolus with resolution of LA to 1.2. Pt continues to be nonverbal today but nods her head when asked if her name is [facility] and if she  is in pain. Afebrile and slightly tachy to 102 today. -pt afebrile so stopping vanc and treating for presumed UTI w/ cefepime for now  -given tachy will restart home metop at a lower dose of 12.5 BID -fu blood/urine cx -trend fever curve -daily CBC w/ diff  *Hypokalemia: Chronic, on K-DUR outpatient, K 3.3 today and repleted -daily BMP and replete as needed  *HTN/CHF: -on lasix and metop outpatient but those were held initially due to low Bps -BP stable but HR elevated so restarting home metop at a lower dose of 12.5 BID  Dispo: Anticipated discharge in approximately 2-3 day(s).   Al Decant, MD 07/29/2019, 9:54 AM Pager: [phone]

## 2019-07-29 NOTE — Progress Notes (Signed)
Pharmacy Antibiotic Note  Victoria Lloyd is a 85 y.o. female admitted on 07/28/2019 with sepsis.  Vancomycin stopped today   Scr stable, WBC WNL, afebrile  Treating for presumed UTI  Plan: Vancomycin stopped Cefepime to 1 gram iv Q 24 for presumed UTI - could probably optimize to Ceftriaxone?  Pharmacy to sign off and follow peripherally for changes in renal function  Height: 5\' 3"  (160 cm) Weight: 115 lb 8.3 oz (52.4 kg) IBW/kg (Calculated) : 52.4  Temp (24hrs), Avg:98.6 F (37 C), Min:97.7 F (36.5 C), Max:99.9 F (37.7 C)  Recent Labs  Lab 07/28/19 1035 07/28/19 1400 07/28/19 1945 07/28/19 2127 07/28/19 2333 07/29/19 0004  WBC  --  10.4  --   --   --  8.8  CREATININE 1.09*  --   --  0.87  --  0.95  LATICACIDVEN 1.6  --  2.2*  --  1.2  --     Estimated Creatinine Clearance: 29.3 mL/min (by C-G formula based on SCr of 0.95 mg/dL).    Allergies  Allergen Reactions  . Codeine Other (See Comments)    Unknown; patient cannot recall the reaction  . Sulfa Antibiotics Other (See Comments)    Unknown; patient cannot recall the reaction  . Lactose Intolerance (Gi) Nausea And Vomiting  . Milk-Related Compounds Nausea And Vomiting  . Other Other (See Comments)    "Seafood"- unknown reaction, listed on MAR.    Antimicrobials this admission: Vanc 9/3>>9/4 Cefepime 9/3>> Flagyl x 1 9/3    Thank you for allowing pharmacy to be a part of this patient's care.  Tad Moore 07/29/2019 9:57 AM

## 2019-07-29 NOTE — Evaluation (Signed)
Clinical/Bedside Swallow Evaluation Patient Details  Name: Victoria Lloyd MRN: [id number] Date of Birth: November 29, 1921  Today's Date: 07/29/2019 Time: SLP Start Time (ACUTE ONLY): 40 SLP Stop Time (ACUTE ONLY): 0928 SLP Time Calculation (min) (ACUTE ONLY): 16 min  Past Medical History:  Past Medical History:  Diagnosis Date  . Chronic diastolic (congestive) heart failure ([place])   . CKD (chronic kidney disease), stage IV ([place])   . Complete heart block (HCC)    a. s/p MDT dual chamber PPM   . Hypertension   . Stroke ([address]) 02/01/2018   CT scan =>  Chronic right PCA distribution infarct with right occipital lobe   Past Surgical History:  Past Surgical History:  Procedure Laterality Date  . CATARACT EXTRACTION    . PERMANENT PACEMAKER INSERTION N/A 08/04/2014   Procedure: PERMANENT PACEMAKER INSERTION;  Surgeon: Deboraha Sprang, MD;  Location: [facility] LAB;  Service: Cardiovascular;  Laterality: N/A;   HPI:  Victoria Lloyd is a 85 year old female who presented from a memory care facility with fever, AMS, and hypotension.  PMH includes: Dementia, HTN, CKD, CHF, and pacemaker.  Per son report, pt speaks little at baseline, but she is currently non-verbal.      Assessment / Plan / Recommendation Clinical Impression  Pt presents with mild oral dysphagia and suspected functional pharyngeal phase of the swallow.  Pt consumed trials of thin liquid, puree, and regular solids without clinical s/sx of aspiration despite challenging across multiple trials.  Pt was observed to have prolonged mastication with regular solids and minimal oral residue that she was able to clear effectively with a cued liquid wash. Recommend a Dysphagia 2 (fine chop)/Thin liquid diet with medications administered crushed in puree.  Pt will require 1:1 assistance with all po intake.  Due to progressive nature of dementia, suspect that advancement towards more complex solids may be limited.   SLP Visit Diagnosis: Dysphagia,  unspecified (R13.10)    Aspiration Risk  Mild aspiration risk    Diet Recommendation Thin liquid;Dysphagia 2 (Fine chop)   Liquid Administration via: Straw Medication Administration: Crushed with puree Supervision: Staff to assist with self feeding Compensations: Slow rate;Small sips/bites;Follow solids with liquid Postural Changes: Seated upright at 90 degrees    Other  Recommendations Oral Care Recommendations: Oral care BID   Follow up Recommendations Skilled Nursing facility      Frequency and Duration min 2x/week  2 weeks       Prognosis Prognosis for Safe Diet Advancement: Fair Barriers to Reach Goals: Cognitive deficits;Severity of deficits      Swallow Study   General HPI: Victoria Lloyd is a 85 year old female who presented from a memory care facility with fever, AMS, and hypotension.  PMH includes: HTN, CKD, CHF, and pacemaker.  Per son report, pt speaks little at baseline, but she is currently non-verbal.    Type of Study: Bedside Swallow Evaluation Previous Swallow Assessment: Pt was seen by ST department in 2019 and was cleared for a regular/thin liquid diet  Diet Prior to this Study: NPO Temperature Spikes Noted: Yes Respiratory Status: Room air History of Recent Intubation: No Behavior/Cognition: Alert;Cooperative;Requires cueing Oral Cavity Assessment: Dry Oral Care Completed by SLP: No Oral Cavity - Dentition: Dentures, top;Dentures, bottom Vision: Impaired for self-feeding Self-Feeding Abilities: Needs assist Patient Positioning: Upright in bed Baseline Vocal Quality: Low vocal intensity(Pt was only able to voice 2x during this evaluation. ) Volitional Cough: Cognitively unable to elicit Volitional Swallow: Unable to elicit  Oral/Motor/Sensory Function Overall Oral Motor/Sensory Function: Mild impairment Facial ROM: Within Functional Limits Facial Symmetry: Within Functional Limits Lingual ROM: Reduced right;Reduced left Lingual Symmetry:  Within Functional Limits   Ice Chips Ice chips: Within functional limits Presentation: Spoon   Thin Liquid Thin Liquid: Impaired Presentation: Spoon;Straw Pharyngeal  Phase Impairments: Suspected delayed Swallow    Nectar Thick Nectar Thick Liquid: Not tested   Honey Thick Honey Thick Liquid: Not tested   Puree Puree: Within functional limits Presentation: Spoon   Solid     Solid: Impaired Presentation: (Place in pt's oral cavity by SLP ) Oral Phase Impairments: Impaired mastication Oral Phase Functional Implications: Prolonged oral transit;Oral residue;Impaired mastication Pharyngeal Phase Impairments: Suspected delayed Swallow      Victoria Lloyd P Victoria Lloyd 07/29/2019,9:57 AM

## 2019-07-29 NOTE — Progress Notes (Addendum)
  Date: 07/29/2019  Patient name: Victoria Lloyd  Medical record number: [id number]  Date of birth: [birth year]   I have seen and evaluated Gerome Sam and discussed their care with the Residency Team. Briefly, Ms. Hannah Beat is a 85 year old woman with PMH of HTN, CKD, chronic mild HFpEF, heart block with pacemaker in place who presented for fever, AMS and hypotension at her SNF.  She talks very little at baseline and at this time, she is only nodding yes and no to questions.  She is following some commands.  She was noted to have an abnormal UA and is being treated for sepsis 2/2 UTI  Vitals:   07/29/19 1136 07/29/19 1350  BP: 127/61 131/68  Pulse: 76 80  Resp: 18   Temp: 98.2 F (36.8 C)   SpO2: 97%    General: Elderly woman, sitting up in bed, no apparent distress Eyes: Anicteric sclerae, tearing HENT: Dry MM CV: Mildly tachycardic, regular, no murmur Pulm: Breathing comfortably, no audible wheezing Abd: Soft, +BS MSK: Decreased muscle tone Psych: Non verbal, unable to assess orientation, alert to voice  UC has been finalized as insignificant growth  SARs-CoV2 negative  Assessment and Plan: I have seen and evaluated the patient as outlined above. I agree with the formulated Assessment and Plan as detailed in the residents' note, with the following changes:   1. Sepsis 2/2 presumed urinary source - Initially started on broad spectrum Abx with vancomycin and cefepime - Stop vancomycin --> MRSA UTI is rare without instrumentation - Continue cefepime for one more day, consider narrowing to ceftriaxone if she does well. - Telemetry - Tylenol for fever - Trend fever curve and vitals  2. Chronic HFpEF - Currently not in acute exacerbation - Restart metoprolol at lower dose from home, hold lasix at this time given acute infection.   Other issues per Dr. Webb Silversmith note.   Given high fevers and unknown source of infection (presumed urinary), Ms. Kresge is at high risk for  deterioration and would benefit from further monitoring in the hospital.    Sid Falcon, MD 9/4/20202:07 PM

## 2019-07-29 NOTE — Progress Notes (Addendum)
   07/29/19 0942  MEWS Assessment  Is this an acute change? Yes   Mews score: 3, Yellow color. This change due to elevated HR, 104. Notified MD face to face.

## 2019-07-30 ENCOUNTER — Inpatient Hospital Stay (HOSPITAL_COMMUNITY): Payer: Medicare Other

## 2019-07-30 ENCOUNTER — Encounter (HOSPITAL_COMMUNITY): Payer: Self-pay | Admitting: Radiology

## 2019-07-30 LAB — BASIC METABOLIC PANEL
Anion gap: 14 (ref 5–15)
BUN: 29 mg/dL — ABNORMAL HIGH (ref 8–23)
CO2: 19 mmol/L — ABNORMAL LOW (ref 22–32)
Calcium: 8.7 mg/dL — ABNORMAL LOW (ref 8.9–10.3)
Chloride: 109 mmol/L (ref 98–111)
Creatinine, Ser: 1.01 mg/dL — ABNORMAL HIGH (ref 0.44–1.00)
GFR calc Af Amer: 55 mL/min — ABNORMAL LOW (ref >60)
GFR calc non Af Amer: 47 mL/min — ABNORMAL LOW (ref >60)
Glucose, Bld: 138 mg/dL — ABNORMAL HIGH (ref 70–99)
Potassium: 3.9 mmol/L (ref 3.5–5.1)
Sodium: 142 mmol/L (ref 135–145)

## 2019-07-30 LAB — CBC
HCT: 36.1 % (ref 36.0–46.0)
Hemoglobin: 11.7 g/dL — ABNORMAL LOW (ref 12.0–15.0)
MCH: 28.5 pg (ref 26.0–34.0)
MCHC: 32.4 g/dL (ref 30.0–36.0)
MCV: 88 fL (ref 80.0–100.0)
Platelets: 234 10*3/uL (ref 150–400)
RBC: 4.1 MIL/uL (ref 3.87–5.11)
RDW: 15 % (ref 11.5–15.5)
WBC: 10.1 10*3/uL (ref 4.0–10.5)
nRBC: 0 % (ref 0.0–0.2)

## 2019-07-30 MED ORDER — IOHEXOL 300 MG/ML  SOLN
75.0000 mL | Freq: Once | INTRAMUSCULAR | Status: AC | PRN
  Administered 2019-07-30: 75 mL via INTRAVENOUS

## 2019-07-30 MED ORDER — SODIUM CHLORIDE 0.9 % IV SOLN
1.0000 g | INTRAVENOUS | Status: DC
  Administered 2019-07-30: 12:00:00 1 g via INTRAVENOUS
  Filled 2019-07-30: qty 10

## 2019-07-30 NOTE — Progress Notes (Signed)
Pt has 12 beats of V Tach. Notified MD

## 2019-07-30 NOTE — Progress Notes (Signed)
Pt refused to be repositioned, The staff tried to move her on comfortable position and she get agitated. Then, she refused her lunch.

## 2019-07-30 NOTE — Plan of Care (Signed)
  Problem: Urinary Elimination: Goal: Signs and symptoms of infection will decrease Outcome: Progressing   Problem: Fluid Volume: Goal: Hemodynamic stability will improve Outcome: Progressing   Problem: Clinical Measurements: Goal: Diagnostic test results will improve Outcome: Progressing Goal: Signs and symptoms of infection will decrease Outcome: Progressing   Problem: Respiratory: Goal: Ability to maintain adequate ventilation will improve Outcome: Progressing

## 2019-07-30 NOTE — Progress Notes (Signed)
Pt refused to be turned, scream and agitate. Continue monitor, pt rest in bed

## 2019-07-30 NOTE — Progress Notes (Signed)
   Subjective: Ms. Lelon Frohlich was lying in bed this morning, no acute events overnight she was more verbal today and was able to tell me her name.  She reported that she was having abdominal pain, that it started after eating, and that she has been having diarrhea.  When asked where the abdominal pain was she stated in her stomach, was unable to clarify location, characteristics, or duration.  Patient was unable to tell me what year it was, where she was, or why she was here.  She became very aggravated upon further questioning.   Objective:  Vital signs in last 24 hours: Vitals:   07/29/19 1946 07/29/19 2213 07/30/19 0111 07/30/19 0553  BP: 135/69 133/82 138/70 135/75  Pulse: 95 97 81 87  Resp: 16  16 16   Temp: 98.9 F (37.2 C)  98.4 F (36.9 C) 98.6 F (37 C)  TempSrc: Oral  Oral Oral  SpO2: 98%  94% 99%  Weight:    52.4 kg  Height:        General: Elderly female, no acute distress, lying in bed Cardiac: RRR, no murmurs rubs or gallops Pulmonary: CTA BL, no wheezing, rhonchi, rales Abdomen: Normoactive bowel sounds, soft, tender to palpation diffusely, no rebound Psychiatry: Intermittent agitation with questioning, normal affect    Assessment/Plan:  Active Problems:   Fever of unknown origin (FUO)   Altered mental status   Hypokalemia   Sepsis ([place])  This is a 85 year old female with history of hypertension, CKD stage III, CHF (last EF 55-60%), dementia, and 3rd degree heart block s/p pacemake placement presented from her skilled nursing facility with a fever of 100.5 altered mental status.   Sepsis 2/2 presumed UTI: Unclear if this is caused by the UTI or if there is another source. Today patient reports abdominal pain is unclear on her initial presentation, she has tenderness to palpation in the abdominal area however no acute abdomen on exam.  Given unclear findings will need CT abdomen to further evaluate for possible intra-abdominal infection. On cefepime now, patient has  been afebrile with no leukocytosis. Urine Cx showed <10,000 colonies with insignificant growth.   -Switch from cefepime to ceftriaxone today  -CT abdomen pelvis -Continue trending fever curve -Daily CBC  -Blood cultures negative to date, continue to follow  Chronic HFpEF: -Stable. Restarted home metoprolol at lower dose, 12.5 mg. BP normotensive now. Continue to monitor.   FEN: No fluids, replete lytes prn, dysphagia diet  VTE ppx: Lovenox  Code Status: FULL    Dispo: Anticipated discharge is pending clinical improvement.   Asencion Noble, MD 07/30/2019, 6:06 AM Pager: [phone]

## 2019-07-31 ENCOUNTER — Inpatient Hospital Stay (HOSPITAL_COMMUNITY): Payer: Medicare Other

## 2019-07-31 DIAGNOSIS — W19XXXA Unspecified fall, initial encounter: Secondary | ICD-10-CM

## 2019-07-31 DIAGNOSIS — F0390 Unspecified dementia without behavioral disturbance: Secondary | ICD-10-CM

## 2019-07-31 DIAGNOSIS — S72002A Fracture of unspecified part of neck of left femur, initial encounter for closed fracture: Secondary | ICD-10-CM | POA: Diagnosis present

## 2019-07-31 LAB — BASIC METABOLIC PANEL
Anion gap: 12 (ref 5–15)
BUN: 26 mg/dL — ABNORMAL HIGH (ref 8–23)
CO2: 18 mmol/L — ABNORMAL LOW (ref 22–32)
Calcium: 9 mg/dL (ref 8.9–10.3)
Chloride: 116 mmol/L — ABNORMAL HIGH (ref 98–111)
Creatinine, Ser: 1.05 mg/dL — ABNORMAL HIGH (ref 0.44–1.00)
GFR calc Af Amer: 52 mL/min — ABNORMAL LOW (ref >60)
GFR calc non Af Amer: 45 mL/min — ABNORMAL LOW (ref >60)
Glucose, Bld: 111 mg/dL — ABNORMAL HIGH (ref 70–99)
Potassium: 3.9 mmol/L (ref 3.5–5.1)
Sodium: 146 mmol/L — ABNORMAL HIGH (ref 135–145)

## 2019-07-31 LAB — CBC
HCT: 39 % (ref 36.0–46.0)
Hemoglobin: 12.4 g/dL (ref 12.0–15.0)
MCH: 28.4 pg (ref 26.0–34.0)
MCHC: 31.8 g/dL (ref 30.0–36.0)
MCV: 89.4 fL (ref 80.0–100.0)
Platelets: 293 10*3/uL (ref 150–400)
RBC: 4.36 MIL/uL (ref 3.87–5.11)
RDW: 15.1 % (ref 11.5–15.5)
WBC: 12.4 10*3/uL — ABNORMAL HIGH (ref 4.0–10.5)
nRBC: 0 % (ref 0.0–0.2)

## 2019-07-31 LAB — GLUCOSE, CAPILLARY: Glucose-Capillary: 127 mg/dL — ABNORMAL HIGH (ref 70–99)

## 2019-07-31 MED ORDER — CHLORHEXIDINE GLUCONATE 4 % EX LIQD
60.0000 mL | Freq: Once | CUTANEOUS | Status: AC
  Administered 2019-07-31: 4 via TOPICAL
  Filled 2019-07-31: qty 60

## 2019-07-31 MED ORDER — OXYCODONE HCL 5 MG PO TABS
5.0000 mg | ORAL_TABLET | Freq: Four times a day (QID) | ORAL | Status: DC | PRN
  Administered 2019-07-31 – 2019-08-07 (×6): 5 mg via ORAL
  Filled 2019-07-31 (×6): qty 1

## 2019-07-31 MED ORDER — CHLORHEXIDINE GLUCONATE 4 % EX LIQD
CUTANEOUS | Status: AC
  Administered 2019-07-31: 23:00:00
  Filled 2019-07-31: qty 15

## 2019-07-31 MED ORDER — POVIDONE-IODINE 10 % EX SWAB: 2.0000 "application " | Freq: Once | CUTANEOUS | Status: AC

## 2019-07-31 MED ORDER — ENSURE PRE-SURGERY PO LIQD
296.0000 mL | Freq: Once | ORAL | Status: DC
  Filled 2019-07-31: qty 296

## 2019-07-31 MED ORDER — CEFAZOLIN SODIUM-DEXTROSE 2-4 GM/100ML-% IV SOLN
2.0000 g | INTRAVENOUS | Status: AC
  Administered 2019-08-01: 2 g via INTRAVENOUS
  Filled 2019-07-31 (×2): qty 100

## 2019-07-31 MED ORDER — TRANEXAMIC ACID-NACL 1000-0.7 MG/100ML-% IV SOLN
1000.0000 mg | INTRAVENOUS | Status: AC
  Administered 2019-08-01: 1000 mg via INTRAVENOUS
  Filled 2019-07-31: qty 100

## 2019-07-31 MED ORDER — MUPIROCIN 2 % EX OINT
1.0000 "application " | TOPICAL_OINTMENT | Freq: Two times a day (BID) | CUTANEOUS | Status: AC
  Administered 2019-07-31 – 2019-08-05 (×8): 1 via NASAL
  Filled 2019-07-31 (×4): qty 22

## 2019-07-31 MED ORDER — DEXTROSE 5 % IV BOLUS
500.0000 mL | Freq: Once | INTRAVENOUS | Status: AC
  Administered 2019-07-31: 15:00:00 500 mL via INTRAVENOUS

## 2019-07-31 MED ORDER — ASPIRIN EC 81 MG PO TBEC
81.0000 mg | DELAYED_RELEASE_TABLET | Freq: Every day | ORAL | Status: DC
  Administered 2019-07-31 – 2019-08-08 (×9): 81 mg via ORAL
  Filled 2019-07-31 (×9): qty 1

## 2019-07-31 NOTE — Progress Notes (Signed)
SLP Cancellation Note  Patient Details Name: Victoria Lloyd MRN: [id number] DOB: [birth year]   Cancelled treatment:       Reason Eval/Treat Not Completed: Patient at procedure or test/unavailable. Pt now NPO, may have surgery today.    Jewelz Ricklefs, Katherene Ponto 07/31/2019, 11:17 AM

## 2019-07-31 NOTE — Progress Notes (Signed)
Patient refused to wake for medication or admission history completion, cursing at staff upon attempt to wake her.

## 2019-07-31 NOTE — Progress Notes (Signed)
Patient refused her schedule metoprolol medication. She is very drowsy at the bedside. Will continue to monitor her.

## 2019-07-31 NOTE — Progress Notes (Addendum)
Subjective: Ms. Lelon Frohlich was seen at bedside this morning. Initially she was not responding to many questions however during abdominal and MSK exam she reported that her legs hurt and abdomen hurts. Upon trying to extend legs she reported that "it hurts, it hurts".   Objective:  Vital signs in last 24 hours: Vitals:   07/30/19 1134 07/30/19 1627 07/30/19 2127 07/30/19 2140  BP: 126/73 134/72 (!) 84/36 124/61  Pulse: 82 90 94 90  Resp: 20 20 15    Temp: 98.5 F (36.9 C) 98.4 F (36.9 C) 99.1 F (37.3 C)   TempSrc: Oral Oral Oral   SpO2: 98% 98% 98%   Weight:      Height:        General: Elderly female, laying in bed with legs pulled into chest Cardiac: RRR, no m/r/g Pulmonary: patient refused Abdomen: Patient refused MSK: Legs pulled up into chest area, exam limited due to pain Psychiatry: Minimal responsiveness until MSK exam   Assessment/Plan:  Principal Problem:   Intertrochanteric fracture of left hip (HCC) Active Problems:   Fever   Altered mental status   Dementia ([place])  This is a 85 year old female with history of hypertension, CKD stage III, CHF (last EF 55-60%), dementia, and 3rd degree heart block s/p pacemake placement presented from her skilled nursing facility with a fever of 100.5 altered mental status.   Left hip fracture: During evaluation of patient's fever and symptoms concerning for sepsis we obtained a CT abdomen pelvis that showed displaced fracture of the left femoral neck with mild proximal migration of the femoral shaft.  Patient did appear to have a fall about 1 month ago.  A hip x-ray in 07/2018 showed no evidence fracture.  This appears to be an acute fracture.  Orthopedics was consulted however patient was not made NPO prior to [place] contacting them.  She does seem to have significant pain responds verbally when examining her lower extremities.  Patient currently has her hips flexed rotated to her left.  Orthopedics evaluated and will plan for surgery  tomorrow, and ordering full-length femur films to evaluate for further fracture.  -Orthopedics following, appreciate recommendations -Plan for surgery tomorrow -N.p.o. at midnight -Oxy IR PRN for pain control  Fever: Patient had fever up to 100.5 at her SNF and on admission she was noted to have U/A that showed some mild signs of infection, but urine culture showed insignificant growth. She received cefepime and was transitioned to ceftriaxone. Blood cultures showed NGTD. CT abd showed no evidence of intra-abdominal infection. She has remained afebrile, has had a small increase in her WBC. Since no clear source has been identified at this time will discontinue antibiotics.   -D/c antibiotics -Continue to trend fever curve -Daily CBC -F/u blood cultures  Chronic HFpEF: Stable. No evidence of fluid overload on exam. Restarted home metoprolol at lower dose, 12.5 mg. BP normotensive now. Continue to monitor.   FEN: No fluids, replete lytes prn, dysphagia diet VTE ppx: Lovenox  Code Status: FULL    Dispo: Anticipated discharge is pending clinical improvement.   Asencion Noble, MD 07/31/2019, 6:05 AM Pager: [phone]   Internal Medicine Attending:   I saw and examined the patient. I reviewed the resident's note and I agree with the resident's findings and plan as documented in the resident's note.  85 year old woman with a medical history of dementia admitted on 9/3 with decreased functional status and fever.  Initial hospital course focused on treating for possible infection with empiric antibiotics,  no source of infection was found.  CT of the abdomen was done yesterday to evaluate abdominal pain seen on exam.  There is no acute abnormality in her abdomen but incidentally noted was a left femoral neck fracture which appears to be acute.  Patient cannot tell us any history of falls due to dementia.  I suspect the fever was likely an acute stress response to the fracture.  Greatly  appreciate. Dr. Doreatha Martin consultation and we will plan for surgical repair tomorrow morning.  We will have her work carefully with PT and OT postoperatively.  I agree with discontinuing antibiotics today.  Though she is at increased risk given her very advanced age and dementia, she lives medically optimized for the surgery at this time.  Lalla Brothers, MD

## 2019-07-31 NOTE — Progress Notes (Signed)
Pt refused to check blood pressure at this time.

## 2019-07-31 NOTE — Progress Notes (Signed)
MEWS/VS Documentation       07/31/2019 0730 07/31/2019 1151 07/31/2019 1250 07/31/2019 1432   MEWS Score:  0  1  1  2    MEWS Score Color:  Green  Green  Green  Yellow   Resp:  --  20  --  18   Pulse:  --  (!) 106  --  (!) 108   BP:  --  (!) 121/54  --  (!) 110/56   Temp:  --  --  --  (!) 100.9 F (38.3 C)   O2 Device:  --  Room Air  --  Room Air   Level of Consciousness:  --  --  Alert  --      Gave PO metoprolol and started IV D5W 546ml.

## 2019-07-31 NOTE — Progress Notes (Signed)
Patient is alert in bed ate 80%  of meal today, Spoke with MD Surgeon PA to make aware, possible hip surgery today.

## 2019-07-31 NOTE — Progress Notes (Signed)
Patient is has temp 100.9 HR 100s Gave beta blocker and pain medication .

## 2019-07-31 NOTE — Plan of Care (Signed)
  Problem: Urinary Elimination: Goal: Signs and symptoms of infection will decrease Outcome: Progressing   Problem: Fluid Volume: Goal: Hemodynamic stability will improve Outcome: Progressing   Problem: Clinical Measurements: Goal: Diagnostic test results will improve Outcome: Progressing Goal: Signs and symptoms of infection will decrease Outcome: Progressing   Problem: Respiratory: Goal: Ability to maintain adequate ventilation will improve Outcome: Progressing

## 2019-07-31 NOTE — Consult Note (Signed)
Orthopaedic Trauma Service (OTS) Consult   Patient ID: Victoria Lloyd MRN: [id number] DOB/AGE: [birth year] 85 y.o.  Reason for Consult:Left displaced femoral neck fracture Referring Physician: Dr. Lalla Brothers, MD Internal Medicine Service  HPI: Victoria Lloyd is an 85 y.o. female is being seen in consultation at the request of Dr. Evette Doffing for evaluation of left displaced femoral neck fracture.  The patient presented to the emergency room from her mental care facility with acute sepsis presumably from a UTI.  She has been treated for this and was more lucid yesterday.  She had dementia but was able to complain of some abdominal pain.  A CT scan obtained yesterday evening showed a displaced femoral neck fracture.  Orthopedics was consulted today.  Unfortunately patient had breakfast prior to being contacted.  Patient was unable to provide any history due to her dementia.  I did talk over the phone with her son.  Apparently she had a fall about a month ago but did not show any signs of any fracture.  She has been having difficulty ambulating the bed and wheelchair.  There is no notes of fall prior to her presentation in the emergency room.  The son did note when he was visiting yesterday that she was holding her leg in an external rotatory position.  They were wondering why her leg was like that.  There are no reports of falls while she has been here in the hospital.  Past Medical History:  Diagnosis Date  . Chronic diastolic (congestive) heart failure ([place])   . CKD (chronic kidney disease), stage IV ([place])   . Complete heart block (HCC)    a. s/p MDT dual chamber PPM   . Hypertension   . Stroke ([place]) 02/01/2018   CT scan =>  Chronic right PCA distribution infarct with right occipital lobe    Past Surgical History:  Procedure Laterality Date  . CATARACT EXTRACTION    . PERMANENT PACEMAKER INSERTION N/A 08/04/2014   Procedure: PERMANENT PACEMAKER INSERTION;  Surgeon: Deboraha Sprang, MD;   Location: [facility] CATH LAB;  Service: Cardiovascular;  Laterality: N/A;    Family History  Problem Relation Age of Onset  . Hypertension Mother   . Heart attack Father     Social History:  reports that she has never smoked. She has never used smokeless tobacco. She reports that she does not drink alcohol or use drugs.  Allergies:  Allergies  Allergen Reactions  . Codeine Other (See Comments)    Unknown; patient cannot recall the reaction  . Sulfa Antibiotics Other (See Comments)    Unknown; patient cannot recall the reaction  . Lactose Intolerance (Gi) Nausea And Vomiting  . Milk-Related Compounds Nausea And Vomiting  . Other Other (See Comments)    "Seafood"- unknown reaction, listed on MAR.    Medications:  No current facility-administered medications on file prior to encounter.    Current Outpatient Medications on File Prior to Encounter  Medication Sig Dispense Refill  . acetaminophen (TYLENOL) 325 MG tablet Take 1-2 tablets (325-650 mg total) by mouth every 4 (four) hours as needed for mild pain. (Patient taking differently: Take 325 mg by mouth every 4 (four) hours as needed for mild pain. )    . ALPRAZolam (XANAX) 0.25 MG tablet Take 0.5 tablets (0.125 mg total) by mouth at bedtime. (Patient taking differently: Take 0.25 mg by mouth at bedtime as needed (insomnia agitation). ) 10 tablet 0  . aspirin 81 MG tablet Take 81 mg by  mouth at bedtime.     Marland Kitchen atorvastatin (LIPITOR) 10 MG tablet Take 10 mg by mouth at bedtime.    . calcium-vitamin D (OSCAL WITH D) 500-200 MG-UNIT per tablet Take 1 tablet by mouth at bedtime.     . cholecalciferol (VITAMIN D) 1000 units tablet Take 1,000 Units by mouth at bedtime.    . cycloSPORINE (RESTASIS) 0.05 % ophthalmic emulsion Place 1 drop into both eyes 2 (two) times daily as needed (dry eyes).     Marland Kitchen diclofenac sodium (VOLTAREN) 1 % GEL Apply 2 g topically 4 (four) times daily. 1 Tube 0  . Emollient (FLANDERS BUTTOCKS) OINT Apply 1 application  topically See admin instructions. With each diaper change    . furosemide (LASIX) 20 MG tablet Take 2 tablets (40 mg total) by mouth daily. 30 tablet 0  . Melatonin 3 MG TABS Take 3 mg by mouth at bedtime as needed (sleep).    . metoprolol tartrate (LOPRESSOR) 25 MG tablet Take 1 tablet (25 mg total) by mouth 2 (two) times daily. 60 tablet 0  . Nutritional Supplements (NUTRITIONAL SHAKE PO) Take 1 each by mouth 2 (two) times daily between meals. Mighty Shake    . ondansetron (ZOFRAN-ODT) 4 MG disintegrating tablet Take 1 tablet (4 mg total) by mouth every 8 (eight) hours as needed for nausea or vomiting. 20 tablet 0  . potassium chloride (K-DUR) 10 MEQ tablet Take 1 tablet (10 mEq total) by mouth daily. 30 tablet 0  . risperiDONE (RISPERDAL) 1 MG/ML oral solution Take 1.25 mg by mouth at bedtime.    . senna (SENOKOT) 8.6 MG TABS tablet Take 2 tablets (17.2 mg total) by mouth daily. 120 each 0  . dextromethorphan-guaiFENesin (MUCINEX DM) 30-600 MG 12hr tablet Take 1 tablet by mouth 2 (two) times daily. (Patient not taking: Reported on 02/01/2018) 14 tablet 0  . lidocaine (LIDODERM) 5 % Place 1 patch onto the skin daily. (Patient not taking: Reported on 02/01/2018) 30 patch 0  . traMADol (ULTRAM) 50 MG tablet Take 1 tablet (50 mg total) by mouth every 6 (six) hours as needed for severe pain. (Patient not taking: Reported on 08/19/2018) 10 tablet 0    ROS: Unable to obtain secondary to dementia.   Exam: Blood pressure 136/69, pulse 86, temperature 98.4 F (36.9 C), temperature source Oral, resp. rate 14, height 5\' 3"  (1.6 m), weight 52.2 kg, SpO2 99 %. General: No acute distress she is resting comfortably Orientation: Not awake alert and oriented to me on exam this morning. Mood and Affect: Sleeping Gait: Unable to ambulate Coordination and balance: Within normal limits   Left lower extremity: Skin without lesions.  The leg is shortened and externally rotated.  She was able to move her toes but  does not follow full commands.  I was not able assess sensation.  She has a warm well-perfused foot.  Medical Decision Making: Data: Imaging: CT scan shows a displaced left femoral neck fracture no other abnormalities on the musculoskeletal evaluation.  Labs:  Results for orders placed or performed during the hospital encounter of 07/28/19 (from the past 48 hour(s))  CBC     Status: Abnormal   Collection Time: 07/30/19  6:45 AM  Result Value Ref Range   WBC 10.1 4.0 - 10.5 K/uL   RBC 4.10 3.87 - 5.11 MIL/uL   Hemoglobin 11.7 (L) 12.0 - 15.0 g/dL   HCT 36.1 36.0 - 46.0 %   MCV 88.0 80.0 - 100.0 fL   MCH  28.5 26.0 - 34.0 pg   MCHC 32.4 30.0 - 36.0 g/dL   RDW 15.0 11.5 - 15.5 %   Platelets 234 150 - 400 K/uL   nRBC 0.0 0.0 - 0.2 %    Comment: Performed at [facility], [place] [address], [place], [place] [id number]  Basic metabolic panel     Status: Abnormal   Collection Time: 07/30/19  6:45 AM  Result Value Ref Range   Sodium 142 135 - 145 mmol/L   Potassium 3.9 3.5 - 5.1 mmol/L   Chloride 109 98 - 111 mmol/L   CO2 19 (L) 22 - 32 mmol/L   Glucose, Bld 138 (H) 70 - 99 mg/dL   BUN 29 (H) 8 - 23 mg/dL   Creatinine, Ser 1.01 (H) 0.44 - 1.00 mg/dL   Calcium 8.7 (L) 8.9 - 10.3 mg/dL   GFR calc non Af Amer 47 (L) >60 mL/min   GFR calc Af Amer 55 (L) >60 mL/min   Anion gap 14 5 - 15    Comment: Performed at [facility] [address] [place], [place] [address]  CBC     Status: Abnormal   Collection Time: 07/31/19  7:49 AM  Result Value Ref Range   WBC 12.4 (H) 4.0 - 10.5 K/uL   RBC 4.36 3.87 - 5.11 MIL/uL   Hemoglobin 12.4 12.0 - 15.0 g/dL   HCT 39.0 36.0 - 46.0 %   MCV 89.4 80.0 - 100.0 fL   MCH 28.4 26.0 - 34.0 pg   MCHC 31.8 30.0 - 36.0 g/dL   RDW 15.1 11.5 - 15.5 %   Platelets 293 150 - 400 K/uL   nRBC 0.0 0.0 - 0.2 %    Comment: Performed at [facility], [place] [address], [place], [place] [id number]  Basic metabolic panel     Status: Abnormal   Collection  Time: 07/31/19  7:49 AM  Result Value Ref Range   Sodium 146 (H) 135 - 145 mmol/L   Potassium 3.9 3.5 - 5.1 mmol/L   Chloride 116 (H) 98 - 111 mmol/L   CO2 18 (L) 22 - 32 mmol/L   Glucose, Bld 111 (H) 70 - 99 mg/dL   BUN 26 (H) 8 - 23 mg/dL   Creatinine, Ser 1.05 (H) 0.44 - 1.00 mg/dL   Calcium 9.0 8.9 - 10.3 mg/dL   GFR calc non Af Amer 45 (L) >60 mL/min   GFR calc Af Amer 52 (L) >60 mL/min   Anion gap 12 5 - 15    Comment: Performed at [facility] [address], [place], [place] [address]    Imaging or Labs ordered: We will order new AP pelvis with a lateral view of the left hip.  We will also order full-length femur films.  Medical history and chart was reviewed and case discussed with medical provider.  Assessment/Plan: 85 year old female with a history of congestive heart failure who presented with UTI and urosepsis with a left displaced femoral neck fracture found incidentally on CT scan of unknown chronicity  Patient likely had an acute injury as the fracture does not appear to be chronic in nature.  I recommend proceeding with left hip hemiarthroplasty.  I have discussed the case with a Dr. Lyla Glassing and he has graciously agreed to proceed with surgery tomorrow morning for hemiarthroplasty.  I discussed risks and benefits with the patient's son over the phone.  Risks included but not limited to bleeding, infection,  periprosthetic fracture, dislocation, nerve and blood vessel injury, even possibility anesthetic complications.  He agrees to proceed with surgery consent will be obtained.  I will obtain full-length femur films to make sure there is no further fracture is lower down on the leg.  Shona Needles, MD [facility] [phone] (phone) [phone] (office) [email or web address]

## 2019-08-01 ENCOUNTER — Inpatient Hospital Stay (HOSPITAL_COMMUNITY): Payer: Medicare Other

## 2019-08-01 ENCOUNTER — Encounter (HOSPITAL_COMMUNITY)
Admission: EM | Disposition: A | Discharge status: Skilled Nursing Facility | Payer: Self-pay | Source: Home / Self Care | Attending: Internal Medicine

## 2019-08-01 ENCOUNTER — Encounter (HOSPITAL_COMMUNITY): Payer: Self-pay | Admitting: Anesthesiology

## 2019-08-01 ENCOUNTER — Inpatient Hospital Stay (HOSPITAL_COMMUNITY): Payer: Medicare Other | Admitting: Anesthesiology

## 2019-08-01 HISTORY — PX: ANTERIOR APPROACH HEMI HIP ARTHROPLASTY: SHX6690

## 2019-08-01 LAB — BASIC METABOLIC PANEL WITH GFR
Anion gap: 11 (ref 5–15)
BUN: 26 mg/dL — ABNORMAL HIGH (ref 8–23)
CO2: 19 mmol/L — ABNORMAL LOW (ref 22–32)
Calcium: 8.8 mg/dL — ABNORMAL LOW (ref 8.9–10.3)
Chloride: 116 mmol/L — ABNORMAL HIGH (ref 98–111)
Creatinine, Ser: 1.05 mg/dL — ABNORMAL HIGH (ref 0.44–1.00)
GFR calc Af Amer: 52 mL/min — ABNORMAL LOW
GFR calc non Af Amer: 45 mL/min — ABNORMAL LOW
Glucose, Bld: 142 mg/dL — ABNORMAL HIGH (ref 70–99)
Potassium: 4.2 mmol/L (ref 3.5–5.1)
Sodium: 146 mmol/L — ABNORMAL HIGH (ref 135–145)

## 2019-08-01 LAB — SURGICAL PCR SCREEN
MRSA, PCR: NEGATIVE
Staphylococcus aureus: NEGATIVE

## 2019-08-01 LAB — CBC
HCT: 40.1 % (ref 36.0–46.0)
Hemoglobin: 12.6 g/dL (ref 12.0–15.0)
MCH: 28.6 pg (ref 26.0–34.0)
MCHC: 31.4 g/dL (ref 30.0–36.0)
MCV: 90.9 fL (ref 80.0–100.0)
Platelets: 234 10*3/uL (ref 150–400)
RBC: 4.41 MIL/uL (ref 3.87–5.11)
RDW: 15 % (ref 11.5–15.5)
WBC: 10.4 10*3/uL (ref 4.0–10.5)
nRBC: 0 % (ref 0.0–0.2)

## 2019-08-01 SURGERY — HEMIARTHROPLASTY, HIP, DIRECT ANTERIOR APPROACH, FOR FRACTURE
Anesthesia: Spinal | Site: Hip | Laterality: Left

## 2019-08-01 MED ORDER — PROPOFOL 10 MG/ML IV BOLUS
INTRAVENOUS | Status: AC
  Filled 2019-08-01: qty 20

## 2019-08-01 MED ORDER — PROPOFOL 10 MG/ML IV BOLUS
INTRAVENOUS | Status: DC | PRN
  Administered 2019-08-01: 20 mg via INTRAVENOUS
  Administered 2019-08-01: 10 mg via INTRAVENOUS

## 2019-08-01 MED ORDER — ONDANSETRON HCL 4 MG/2ML IJ SOLN: 4.0000 mg | Freq: Four times a day (QID) | INTRAMUSCULAR | Status: DC | PRN

## 2019-08-01 MED ORDER — DEXAMETHASONE SODIUM PHOSPHATE 10 MG/ML IJ SOLN
INTRAMUSCULAR | Status: AC
  Filled 2019-08-01: qty 1

## 2019-08-01 MED ORDER — BUPIVACAINE-EPINEPHRINE (PF) 0.5% -1:200000 IJ SOLN
INTRAMUSCULAR | Status: AC
  Filled 2019-08-01: qty 30

## 2019-08-01 MED ORDER — ONDANSETRON HCL 4 MG/2ML IJ SOLN: 4.0000 mg | Freq: Once | INTRAMUSCULAR | Status: DC | PRN

## 2019-08-01 MED ORDER — ALBUMIN HUMAN 5 % IV SOLN
INTRAVENOUS | Status: AC
  Filled 2019-08-01: qty 250

## 2019-08-01 MED ORDER — FENTANYL CITRATE (PF) 250 MCG/5ML IJ SOLN
INTRAMUSCULAR | Status: AC
  Filled 2019-08-01: qty 5

## 2019-08-01 MED ORDER — MENTHOL 3 MG MT LOZG: 1.0000 | LOZENGE | OROMUCOSAL | Status: DC | PRN

## 2019-08-01 MED ORDER — PROPOFOL 500 MG/50ML IV EMUL
INTRAVENOUS | Status: DC | PRN
  Administered 2019-08-01: 15 ug/kg/min via INTRAVENOUS

## 2019-08-01 MED ORDER — PHENYLEPHRINE HCL-NACL 10-0.9 MG/250ML-% IV SOLN
0.0000 ug/min | INTRAVENOUS | Status: DC
  Administered 2019-08-01: 10 ug/min via INTRAVENOUS

## 2019-08-01 MED ORDER — METOCLOPRAMIDE HCL 5 MG/ML IJ SOLN: 5.0000 mg | Freq: Three times a day (TID) | INTRAMUSCULAR | Status: DC | PRN

## 2019-08-01 MED ORDER — FENTANYL CITRATE (PF) 100 MCG/2ML IJ SOLN: 25.0000 ug | INTRAMUSCULAR | Status: DC | PRN

## 2019-08-01 MED ORDER — BUPIVACAINE-EPINEPHRINE (PF) 0.5% -1:200000 IJ SOLN
INTRAMUSCULAR | Status: DC | PRN
  Administered 2019-08-01: 30 mL

## 2019-08-01 MED ORDER — ONDANSETRON HCL 4 MG/2ML IJ SOLN
INTRAMUSCULAR | Status: AC
  Filled 2019-08-01: qty 2

## 2019-08-01 MED ORDER — SODIUM CHLORIDE 0.9 % IR SOLN
Status: DC | PRN
  Administered 2019-08-01: 3000 mL

## 2019-08-01 MED ORDER — TRANEXAMIC ACID-NACL 1000-0.7 MG/100ML-% IV SOLN
INTRAVENOUS | Status: AC
  Filled 2019-08-01: qty 100

## 2019-08-01 MED ORDER — SODIUM CHLORIDE (PF) 0.9 % IJ SOLN
INTRAMUSCULAR | Status: DC | PRN
  Administered 2019-08-01: 30 mL via INTRAVENOUS

## 2019-08-01 MED ORDER — 0.9 % SODIUM CHLORIDE (POUR BTL) OPTIME
TOPICAL | Status: DC | PRN
  Administered 2019-08-01: 1000 mL

## 2019-08-01 MED ORDER — DOCUSATE SODIUM 100 MG PO CAPS
100.0000 mg | ORAL_CAPSULE | Freq: Two times a day (BID) | ORAL | Status: DC
  Administered 2019-08-01 – 2019-08-04 (×7): 100 mg via ORAL
  Filled 2019-08-01 (×7): qty 1

## 2019-08-01 MED ORDER — ONDANSETRON HCL 4 MG PO TABS: 4.0000 mg | ORAL_TABLET | Freq: Four times a day (QID) | ORAL | Status: DC | PRN

## 2019-08-01 MED ORDER — DEXAMETHASONE SODIUM PHOSPHATE 4 MG/ML IJ SOLN
INTRAMUSCULAR | Status: DC | PRN
  Administered 2019-08-01: 4 mg via INTRAVENOUS

## 2019-08-01 MED ORDER — KETOROLAC TROMETHAMINE 30 MG/ML IJ SOLN
INTRAMUSCULAR | Status: DC | PRN
  Administered 2019-08-01: 30 mg via INTRAVENOUS

## 2019-08-01 MED ORDER — BUPIVACAINE IN DEXTROSE 0.75-8.25 % IT SOLN
INTRATHECAL | Status: DC | PRN
  Administered 2019-08-01: 1.8 mL via INTRATHECAL

## 2019-08-01 MED ORDER — SODIUM CHLORIDE 0.9 % IV SOLN
INTRAVENOUS | Status: AC | PRN
  Administered 2019-08-01: 500 mL via INTRAMUSCULAR

## 2019-08-01 MED ORDER — KETOROLAC TROMETHAMINE 30 MG/ML IJ SOLN
INTRAMUSCULAR | Status: AC
  Filled 2019-08-01: qty 1

## 2019-08-01 MED ORDER — METOCLOPRAMIDE HCL 5 MG PO TABS: 5.0000 mg | ORAL_TABLET | Freq: Three times a day (TID) | ORAL | Status: DC | PRN

## 2019-08-01 MED ORDER — ENOXAPARIN SODIUM 30 MG/0.3ML ~~LOC~~ SOLN
30.0000 mg | SUBCUTANEOUS | Status: DC
  Administered 2019-08-02 – 2019-08-09 (×8): 30 mg via SUBCUTANEOUS
  Filled 2019-08-01 (×8): qty 0.3

## 2019-08-01 MED ORDER — ONDANSETRON HCL 4 MG/2ML IJ SOLN
INTRAMUSCULAR | Status: DC | PRN
  Administered 2019-08-01: 4 mg via INTRAVENOUS

## 2019-08-01 MED ORDER — LIDOCAINE 2% (20 MG/ML) 5 ML SYRINGE
INTRAMUSCULAR | Status: AC
  Filled 2019-08-01: qty 5

## 2019-08-01 MED ORDER — SODIUM CHLORIDE 0.9 % IV SOLN
INTRAVENOUS | Status: DC | PRN
  Administered 2019-08-01: 25 ug/min via INTRAVENOUS

## 2019-08-01 MED ORDER — LACTATED RINGERS IV SOLN
INTRAVENOUS | Status: DC
  Administered 2019-08-01: 09:00:00 via INTRAVENOUS

## 2019-08-01 MED ORDER — ALBUMIN HUMAN 5 % IV SOLN
12.5000 g | Freq: Once | INTRAVENOUS | Status: AC
  Administered 2019-08-01: 12.5 g via INTRAVENOUS

## 2019-08-01 MED ORDER — PHENOL 1.4 % MT LIQD: 1.0000 | OROMUCOSAL | Status: DC | PRN

## 2019-08-01 SURGICAL SUPPLY — 64 items
ADH SKN CLS APL DERMABOND .7 (GAUZE/BANDAGES/DRESSINGS) ×2
ADH SKN CLS LQ APL DERMABOND (GAUZE/BANDAGES/DRESSINGS) ×1
ALCOHOL 70% 16 OZ (MISCELLANEOUS) ×3 IMPLANT
APL PRP STRL LF DISP 70% ISPRP (MISCELLANEOUS) ×1
BLADE CLIPPER SURG (BLADE) IMPLANT
CHLORAPREP W/TINT 26 (MISCELLANEOUS) ×3 IMPLANT
COVER SURGICAL LIGHT HANDLE (MISCELLANEOUS) ×3 IMPLANT
COVER WAND RF STERILE (DRAPES) ×3 IMPLANT
DERMABOND ADHESIVE PROPEN (GAUZE/BANDAGES/DRESSINGS) ×2
DERMABOND ADVANCED (GAUZE/BANDAGES/DRESSINGS) ×4
DERMABOND ADVANCED .7 DNX12 (GAUZE/BANDAGES/DRESSINGS) ×2 IMPLANT
DERMABOND ADVANCED .7 DNX6 (GAUZE/BANDAGES/DRESSINGS) IMPLANT
DRAPE C-ARM 42X72 X-RAY (DRAPES) ×3 IMPLANT
DRAPE STERI IOBAN 125X83 (DRAPES) ×3 IMPLANT
DRAPE U-SHAPE 47X51 STRL (DRAPES) ×9 IMPLANT
DRSG AQUACEL AG ADV 3.5X10 (GAUZE/BANDAGES/DRESSINGS) ×3 IMPLANT
ELECT BLADE 4.0 EZ CLEAN MEGAD (MISCELLANEOUS) ×3
ELECT PENCIL ROCKER SW 15FT (MISCELLANEOUS) ×3 IMPLANT
ELECT REM PT RETURN 9FT ADLT (ELECTROSURGICAL) ×3
ELECTRODE BLDE 4.0 EZ CLN MEGD (MISCELLANEOUS) ×1 IMPLANT
ELECTRODE REM PT RTRN 9FT ADLT (ELECTROSURGICAL) ×1 IMPLANT
EVACUATOR 1/8 PVC DRAIN (DRAIN) IMPLANT
GLOVE BIO SURGEON STRL SZ8.5 (GLOVE) ×6 IMPLANT
GLOVE BIOGEL PI IND STRL 8.5 (GLOVE) ×1 IMPLANT
GLOVE BIOGEL PI INDICATOR 8.5 (GLOVE) ×2
GOWN STRL REUS W/ TWL LRG LVL3 (GOWN DISPOSABLE) ×2 IMPLANT
GOWN STRL REUS W/TWL 2XL LVL3 (GOWN DISPOSABLE) ×3 IMPLANT
GOWN STRL REUS W/TWL LRG LVL3 (GOWN DISPOSABLE) ×6
HANDPIECE INTERPULSE COAX TIP (DISPOSABLE) ×3
HEAD FEM UNIPOLAR 46 OD STRL (Hips) ×2 IMPLANT
HOOD PEEL AWAY FACE SHEILD DIS (HOOD) ×6 IMPLANT
KIT BASIN OR (CUSTOM PROCEDURE TRAY) ×3 IMPLANT
KIT TURNOVER KIT B (KITS) ×3 IMPLANT
MANIFOLD NEPTUNE II (INSTRUMENTS) ×3 IMPLANT
MARKER SKIN DUAL TIP RULER LAB (MISCELLANEOUS) ×6 IMPLANT
NDL SPNL 18GX3.5 QUINCKE PK (NEEDLE) ×1 IMPLANT
NEEDLE SPNL 18GX3.5 QUINCKE PK (NEEDLE) ×3 IMPLANT
NS IRRIG 1000ML POUR BTL (IV SOLUTION) ×3 IMPLANT
PACK TOTAL JOINT (CUSTOM PROCEDURE TRAY) ×3 IMPLANT
PACK UNIVERSAL I (CUSTOM PROCEDURE TRAY) ×3 IMPLANT
PAD ARMBOARD 7.5X6 YLW CONV (MISCELLANEOUS) ×6 IMPLANT
SAW OSC TIP CART 19.5X105X1.3 (SAW) ×3 IMPLANT
SEALER BIPOLAR AQUA 6.0 (INSTRUMENTS) ×2 IMPLANT
SET HNDPC FAN SPRY TIP SCT (DISPOSABLE) ×1 IMPLANT
SOL PREP POV-IOD 4OZ 10% (MISCELLANEOUS) ×3 IMPLANT
SPACER FEM TAPERED +0 12/14 (Hips) ×2 IMPLANT
STEM TRI LOC BPS GRIPTON SZ 2 IMPLANT
SUT ETHIBOND NAB CT1 #1 30IN (SUTURE) ×6 IMPLANT
SUT MNCRL AB 3-0 PS2 18 (SUTURE) ×5 IMPLANT
SUT MON AB 2-0 CT1 36 (SUTURE) ×3 IMPLANT
SUT MON AB 5-0 PS2 18 (SUTURE) ×2 IMPLANT
SUT VIC AB 1 CT1 27 (SUTURE) ×6
SUT VIC AB 1 CT1 27XBRD ANBCTR (SUTURE) ×1 IMPLANT
SUT VIC AB 2-0 CT1 27 (SUTURE) ×6
SUT VIC AB 2-0 CT1 TAPERPNT 27 (SUTURE) ×1 IMPLANT
SUT VLOC 180 0 24IN GS25 (SUTURE) ×3 IMPLANT
SYR 50ML LL SCALE MARK (SYRINGE) ×3 IMPLANT
TOWEL GREEN STERILE (TOWEL DISPOSABLE) ×3 IMPLANT
TOWEL GREEN STERILE FF (TOWEL DISPOSABLE) ×3 IMPLANT
TRAY CATH 16FR W/PLASTIC CATH (SET/KITS/TRAYS/PACK) IMPLANT
TRAY FOLEY W/BAG SLVR 16FR (SET/KITS/TRAYS/PACK)
TRAY FOLEY W/BAG SLVR 16FR ST (SET/KITS/TRAYS/PACK) IMPLANT
TRI LOC BPS W GRIPTON SZ 2 ×3 IMPLANT
WATER STERILE IRR 1000ML POUR (IV SOLUTION) ×9 IMPLANT

## 2019-08-01 NOTE — Progress Notes (Signed)
Spoke to the patient's son to update patient status. Patient is transported to OR for surgery. Hand off given to OR nurse. ABX & signed consent on the chart and given to the OR nurse.

## 2019-08-01 NOTE — Progress Notes (Signed)
Subjective: Patient reports that she has abdominal pain, states that it's worse when she moves around. Is not able to tell us her name.   Consults: Ortho  Objective:  Vital signs in last 24 hours: Vitals:   07/31/19 2032 08/01/19 0050 08/01/19 0508 08/01/19 0521  BP: (!) 107/49 (!) 118/56 125/61   Pulse: 88 96 (!) 113 (!) 109  Resp: 20 17 18    Temp: 100.2 F (37.9 C) 97.7 F (36.5 C) 98.9 F (37.2 C)   TempSrc: Oral Oral Oral   SpO2: 98% 99% 97%   Weight:   51.5 kg   Height:       Physical Exam  Constitutional: No distress.  Neurological: She is alert.  Not oriented to self  Skin: She is not diaphoretic.  Nursing note and vitals reviewed. Declined physical exam  Labs:  CMP Latest Ref Rng & Units 08/01/2019 07/31/2019 07/30/2019  Glucose 70 - 99 mg/dL 142(H) 111(H) 138(H)  BUN 8 - 23 mg/dL 26(H) 26(H) 29(H)  Creatinine 0.44 - 1.00 mg/dL 1.05(H) 1.05(H) 1.01(H)  Sodium 135 - 145 mmol/L 146(H) 146(H) 142  Potassium 3.5 - 5.1 mmol/L 4.2 3.9 3.9  Chloride 98 - 111 mmol/L 116(H) 116(H) 109  CO2 22 - 32 mmol/L 19(L) 18(L) 19(L)  Calcium 8.9 - 10.3 mg/dL 8.8(L) 9.0 8.7(L)  Total Protein 6.5 - 8.1 g/dL - - -  Total Bilirubin 0.3 - 1.2 mg/dL - - -  Alkaline Phos 38 - 126 U/L - - -  AST 15 - 41 U/L - - -  ALT 0 - 44 U/L - - -   CBC Latest Ref Rng & Units 08/01/2019 07/31/2019 07/30/2019  WBC 4.0 - 10.5 K/uL 10.4 12.4(H) 10.1  Hemoglobin 12.0 - 15.0 g/dL 12.6 12.4 11.7(L)  Hematocrit 36.0 - 46.0 % 40.1 39.0 36.1  Platelets 150 - 400 K/uL 234 293 234   Imaging:  DG Hip Unilateral Left: acute femoral neck fracture  DG Knee Left: no acute abnormality, hardware in place  Chest x-ray: stable elevation of right hemidiaphragm, mild right basilar scarring/atelectasis. No acute findings.   Assessment/Plan:  Assessment: Ms. Robbs is our 85 yo F w/ a hx of dementia, HTN, CKD III, HFpEF, 3rd degree heart block s/p pacemaker placement who presented from her nursing home with a fever  of 100.5 and AMS treated for presumed urosepsis whose continued fevers prompted imaging of her abdomen demonstrating a left femoral neck fx for which she is undergoing surgery today.   Plan:  Principal Problem:   Intertrochanteric fracture of left hip (HCC) -as part of her workup for fever/abdominal pain, a left femoral neck fx was incidentally found on CT  -son states she had a fall last month w/ out evidence of fx; no falls while in the hospital -ortho believes this fx appears acute w/ plan for surgery today  -oxy IR PRN for pain control   Active Problems:   Fever/Altered mental status  -pt presented with fever of 100.5 at her nursing home, initial workup concerning for urosepsis for which she received broad spectrum antibiotics narrowed to ceftriaxone alone. Blood cx NGTD and urine cx showed insignificant growth. Patient has endorsed abdominal pain, suprapubic in location originally thought to be related to a UTI, so CT abdomen ordered for additional fever workup which showed no evidence of an intra-abdominal infection. Given no clear source, antibiotics dc'ed yesterday. Patient febrile to 100.9 yesterday. Can get fevers with fractures but likely not up to 100.9. -WBC WNL at  10.2 today from 12.4 yesterday -continue to trend fever curve -daily CBC -if she fevers again may consider repeat blood cx    Chronic HFpEF: -stable, grossly euvolemic but pt declined formal physical exam; on home metop but lower dose of 12.5; pt has had low to normal bps  Dispo: Anticipated discharge pending clinical course.  Al Decant, MD 08/01/2019, 8:07 AM Pager: [phone]

## 2019-08-01 NOTE — Transfer of Care (Signed)
Immediate Anesthesia Transfer of Care Note  Patient: Victoria Lloyd  Procedure(s) Performed: ANTERIOR APPROACH HEMI HIP ARTHROPLASTY (Left Hip)  Patient Location: PACU  Anesthesia Type:MAC and Spinal  Level of Consciousness: awake and patient cooperative  Airway & Oxygen Therapy: Patient Spontanous Breathing and Patient connected to face mask oxygen  Post-op Assessment: Report given to RN and Post -op Vital signs reviewed and stable  Post vital signs: Reviewed  Last Vitals:  Vitals Value Taken Time  BP 89/45 08/01/19 1420  Temp    Pulse 60 08/01/19 1424  Resp 19 08/01/19 1424  SpO2 100 % 08/01/19 1424  Vitals shown include unvalidated device data.  Last Pain:  Vitals:   08/01/19 1154  TempSrc: Oral  PainSc:          Complications: No apparent anesthesia complications

## 2019-08-01 NOTE — Anesthesia Preprocedure Evaluation (Addendum)
Anesthesia Evaluation  Patient identified by MRN, date of birth, ID bandGeneral Assessment Comment:Patient responds to tactile stimulation.  Reviewed: Allergy & Precautions, NPO status , Patient's Chart, lab work & pertinent test results  Airway      Mouth opening: Limited Mouth Opening  Dental  (+) Dental Advisory Given   Pulmonary neg pulmonary ROS,    Pulmonary exam normal        Cardiovascular hypertension, Pt. on home beta blockers +CHF  + dysrhythmias + pacemaker  Rhythm:Regular Rate:Tachycardia  ECG: rate 64. Sinus rhythm Left bundle branch block   Neuro/Psych PSYCHIATRIC DISORDERS Anxiety Dementia CVA    GI/Hepatic negative GI ROS, Neg liver ROS,   Endo/Other  negative endocrine ROS  Renal/GU Renal disease     Musculoskeletal negative musculoskeletal ROS (+)   Abdominal   Peds  Hematology HLD   Anesthesia Other Findings Left femoral neck fracture  Reproductive/Obstetrics                          Anesthesia Physical Anesthesia Plan  ASA: IV  Anesthesia Plan: Spinal   Post-op Pain Management:    Induction:   PONV Risk Score and Plan: 2 and Ondansetron, Dexamethasone and Treatment may vary due to age or medical condition  Airway Management Planned: Natural Airway  Additional Equipment:   Intra-op Plan:   Post-operative Plan:   Informed Consent: I have reviewed the patients History and Physical, chart, labs and discussed the procedure including the risks, benefits and alternatives for the proposed anesthesia with the patient or authorized representative who has indicated his/her understanding and acceptance.     Consent reviewed with POA  Plan Discussed with: CRNA  Anesthesia Plan Comments:         Anesthesia Quick Evaluation

## 2019-08-01 NOTE — Anesthesia Procedure Notes (Signed)
Procedure Name: MAC Date/Time: 08/01/2019 12:50 PM Performed by: Jenne Campus, CRNA Pre-anesthesia Checklist: Patient identified, Emergency Drugs available, Suction available and Patient being monitored Oxygen Delivery Method: Simple face mask

## 2019-08-01 NOTE — TOC Initial Note (Signed)
Transition of Care [facility]) - Initial/Assessment Note    Patient Details  Name: Victoria Lloyd MRN: [id number] Date of Birth: 09/25/1922  [facility]) CM/SW Contact:    Victoria Lloyd,  Phone Number: [phone] 08/01/2019, 9:41 AM  Clinical Narrative:                  CSW received call from patient's son inquiring as to discharge planning process. He acknowledges patient is in surgery today and he will be driving 5 hours to visit today. CSW informed him that PT/OT will assess patient to determine venue at time of discharge. He reports patient is from [facility], however he is open to exploring SNF if patient needs rehab or looking into [facility] PT/OT at [facility]. CSW informed him once surgery complete PT will assess and make recommendations as appropriate.   CSW will continue to follow for discharge planning needs.   Expected Discharge Plan: Assisted Living Barriers to Discharge: Continued Medical Work up   Patient Goals and CMS Choice   CMS Medicare.gov Compare Post Acute Care list provided to:: Patient Represenative (must comment)(Victoria Lloyd (son)) Choice offered to / list presented to : Adult Children  Expected Discharge Plan and Services Expected Discharge Plan: Assisted Living       Living arrangements for the past 2 months: Assisted Living Facility([facility])                                      Prior Living Arrangements/Services Living arrangements for the past 2 months: Assisted Living Facility([facility]) Lives with:: Self Patient language and need for interpreter reviewed:: Yes        Need for Family Participation in Patient Care: Yes (Comment) Care giver support system in place?: Yes (comment)   Criminal Activity/Legal Involvement Pertinent to Current Situation/Hospitalization: No - Comment as needed  Activities of Daily Living      Permission Sought/Granted Permission sought to share information with : Case Manager,  Customer service manager, Family Supports Permission granted to share information with : Yes, Verbal Permission Granted  Share Information with NAME: Victoria Lloyd  Permission granted to share info w AGENCY: SNFs/ALF  Permission granted to share info w Relationship: son  Permission granted to share info w Contact Information: [phone]  Emotional Assessment Appearance:: Other (Comment Required(unable to determine) Attitude/Demeanor/Rapport: Unable to Assess Affect (typically observed): Unable to Assess Orientation: : Oriented to Self Alcohol / Substance Use: Not Applicable Psych Involvement: No (comment)  Admission diagnosis:  Hypokalemia [E87.6] Delirium [R41.0] Fever of unknown origin (FUO) [R50.9] Patient Active Problem List   Diagnosis Date Noted  . Intertrochanteric fracture of left hip ([place]) 07/31/2019  . Dementia ([place]) 07/31/2019  . Altered mental status 07/28/2019  . Acute encephalopathy 08/19/2018  . Fall at home, initial encounter 02/02/2018  . Fever 02/01/2018  . UTI (lower urinary tract infection)   . Anxiety 06/03/2016  . SOB (shortness of breath) 06/03/2016  . Elevated troponin 06/03/2016  . CHF exacerbation ([place]) 06/03/2016  . Acute on chronic diastolic (congestive) heart failure ([place])   . Syncope 04/24/2016  . Pacemaker- [employer] impalnted 08/04/14 08/06/2014  . AV block, 3rd degree (HCC) 08/03/2014  . Acute CHF- secondary to bradycardia on admision (Nl LVF by echo) 08/03/2014  . CKD (chronic kidney disease) stage 4, GFR 15-29 ml/min (HCC) 08/03/2014  . Bradycardia 08/03/2014  . Hypertension 03/15/2011   PCP:  Patient, No Pcp Per Pharmacy:  No Pharmacies Listed    Social Determinants of Health (SDOH) Interventions    Readmission Risk Interventions No flowsheet data found.

## 2019-08-01 NOTE — Consult Note (Signed)
ORTHOPAEDIC CONSULTATION  REQUESTING PHYSICIAN: Axel Filler, *  PCP:  Patient, No Pcp Per  Chief Complaint: Left femoral neck fracture  HPI: Victoria Lloyd is a 85 y.o. female with a history of dementia, CHF, CKD 4, heart block, hypertension, and previous stroke who presented to the hospital several days ago with sepsis secondary to UTI and abdominal pain.  A CT scan was obtained showing a displaced left femoral neck fracture.  I was asked to see the patient by Dr. Doreatha Martin.  Patient has severe dementia, and history was obtained from the chart.  Past Medical History:  Diagnosis Date   Chronic diastolic (congestive) heart failure (HCC)    CKD (chronic kidney disease), stage IV (HCC)    Complete heart block ([place])    a. s/p MDT dual chamber PPM    Hypertension    Stroke ([place]) 02/01/2018   CT scan =>  Chronic right PCA distribution infarct with right occipital lobe   Past Surgical History:  Procedure Laterality Date   CATARACT EXTRACTION     PERMANENT PACEMAKER INSERTION N/A 08/04/2014   Procedure: PERMANENT PACEMAKER INSERTION;  Surgeon: Deboraha Sprang, MD;  Location: [facility] CATH LAB;  Service: Cardiovascular;  Laterality: N/A;   Social History   Socioeconomic History   Marital status: Widowed    Spouse name: Not on file   Number of children: Not on file   Years of education: Not on file   Highest education level: Not on file  Occupational History   Not on file  Social Needs   Financial resource strain: Not on file   Food insecurity    Worry: Not on file    Inability: Not on file   Transportation needs    Medical: Not on file    Non-medical: Not on file  Tobacco Use   Smoking status: Never Smoker   Smokeless tobacco: Never Used  Substance and Sexual Activity   Alcohol use: No   Drug use: No   Sexual activity: Not on file  Lifestyle   Physical activity    Days per week: Not on file    Minutes per session: Not on file   Stress: Not  on file  Relationships   Social connections    Talks on phone: Not on file    Gets together: Not on file    Attends religious service: Not on file    Active member of club or organization: Not on file    Attends meetings of clubs or organizations: Not on file    Relationship status: Not on file  Other Topics Concern   Not on file  Social History Narrative   Not on file   Family History  Problem Relation Age of Onset   Hypertension Mother    Heart attack Father    Allergies  Allergen Reactions   Codeine Other (See Comments)    Unknown; patient cannot recall the reaction   Sulfa Antibiotics Other (See Comments)    Unknown; patient cannot recall the reaction   Lactose Intolerance (Gi) Nausea And Vomiting   Milk-Related Compounds Nausea And Vomiting   Other Other (See Comments)    "Seafood"- unknown reaction, listed on MAR.   Prior to Admission medications   Medication Sig Start Date End Date Taking? Authorizing Provider  acetaminophen (TYLENOL) 325 MG tablet Take 1-2 tablets (325-650 mg total) by mouth every 4 (four) hours as needed for mild pain. Patient taking differently: Take 325 mg by mouth every 4 (  four) hours as needed for mild pain.  08/06/14  Yes Kilroy, Luke K, PA-C  ALPRAZolam [name]) 0.25 MG tablet Take 0.5 tablets (0.125 mg total) by mouth at bedtime. Patient taking differently: Take 0.25 mg by mouth at bedtime as needed (insomnia agitation).  02/03/18  Yes Sheikh, Omair Latif, DO  aspirin 81 MG tablet Take 81 mg by mouth at bedtime.    Yes [provider]  atorvastatin (LIPITOR) 10 MG tablet Take 10 mg by mouth at bedtime. 08/05/18  Yes [provider]  calcium-vitamin D (OSCAL WITH D) 500-200 MG-UNIT per tablet Take 1 tablet by mouth at bedtime.    Yes [provider]  cholecalciferol (VITAMIN D) 1000 units tablet Take 1,000 Units by mouth at bedtime.   Yes [provider]  cycloSPORINE (RESTASIS) 0.05 % ophthalmic  emulsion Place 1 drop into both eyes 2 (two) times daily as needed (dry eyes).    Yes [provider]  diclofenac sodium (VOLTAREN) 1 % GEL Apply 2 g topically 4 (four) times daily. 02/03/18  Yes Sheikh, Omair Latif, DO  Emollient (FLANDERS BUTTOCKS) OINT Apply 1 application topically See admin instructions. With each diaper change   Yes [provider]  furosemide (LASIX) 20 MG tablet Take 2 tablets (40 mg total) by mouth daily. 08/22/18  Yes Thurnell Lose, MD  Melatonin 3 MG TABS Take 3 mg by mouth at bedtime as needed (sleep).   Yes [provider]  metoprolol tartrate (LOPRESSOR) 25 MG tablet Take 1 tablet (25 mg total) by mouth 2 (two) times daily. 08/22/18 08/22/19 Yes Thurnell Lose, MD  Nutritional Supplements (NUTRITIONAL SHAKE PO) Take 1 each by mouth 2 (two) times daily between meals. Mighty Shake   Yes [provider]  ondansetron (ZOFRAN-ODT) 4 MG disintegrating tablet Take 1 tablet (4 mg total) by mouth every 8 (eight) hours as needed for nausea or vomiting. 04/29/16  Yes Hollice Gong, Mir Mohammed, MD  potassium chloride (K-DUR) 10 MEQ tablet Take 1 tablet (10 mEq total) by mouth daily. 08/22/18  Yes Thurnell Lose, MD  risperiDONE (RISPERDAL) 1 MG/ML oral solution Take 1.25 mg by mouth at bedtime. 08/15/18  Yes [provider]  senna (SENOKOT) 8.6 MG TABS tablet Take 2 tablets (17.2 mg total) by mouth daily. 04/29/16  Yes Hollice Gong, Mir Mohammed, MD  dextromethorphan-guaiFENesin [facility] DM) 30-600 MG 12hr tablet Take 1 tablet by mouth 2 (two) times daily. Patient not taking: Reported on 02/01/2018 06/06/16   Cristal Ford, DO  lidocaine (LIDODERM) 5 % Place 1 patch onto the skin daily. Patient not taking: Reported on 02/01/2018 06/06/16   Cristal Ford, DO  traMADol (ULTRAM) 50 MG tablet Take 1 tablet (50 mg total) by mouth every 6 (six) hours as needed for severe pain. Patient not taking: Reported on 08/19/2018 02/03/18   Kerney Elbe, DO   Ct Abdomen Pelvis W Contrast  Result Date: 07/30/2019 CLINICAL DATA:  85 year old female with generalized abdominal pain. EXAM: CT ABDOMEN AND PELVIS WITH CONTRAST TECHNIQUE: Multidetector CT imaging of the abdomen and pelvis was performed using the standard protocol following bolus administration of intravenous contrast. CONTRAST:  18mL OMNIPAQUE IOHEXOL 300 MG/ML  SOLN COMPARISON:  None. FINDINGS: Evaluation of this exam is limited due to motion artifact. Lower chest: Partially visualized small bilateral pleural effusions with associated bibasilar compressive atelectasis. Right hilar and mediastinal calcified lymph nodes/granuloma noted. There is mild cardiomegaly. Multi vessel coronary vascular calcification. Partially visualized pacemaker wires. No intra-abdominal free air or  free fluid. Hepatobiliary: Subcentimeter right hepatic hypodense focus is too small to characterize. The liver is otherwise unremarkable. Mild intrahepatic biliary ductal dilatation. The gallbladder is unremarkable. Pancreas: Unremarkable. No pancreatic ductal dilatation or surrounding inflammatory changes. Spleen: Normal in size without focal abnormality. Adrenals/Urinary Tract: The adrenal glands are unremarkable. There is a 4 cm right renal upper pole cyst. There is no hydronephrosis on either side. There is symmetric enhancement and excretion of contrast by both kidneys. There is a 3 mm calculus along the left posterior bladder wall which may represent a recently passed stone or a bladder calculus. The urinary bladder is otherwise unremarkable. Stomach/Bowel: Evaluation of the bowel is limited due to respiratory motion artifact. There is inflammatory changes and thickening of the gastric wall which may represent gastritis. Gastric ulcer is not entirely excluded but not evaluated on this CT. There is a 17 x 31 mm contrast attenuation anterior to the right renal vein (series 3, image 23) which is not well characterized but  may represent oral contrast within the duodenum a bleeding ulcer is not entirely excluded. There is extensive sigmoid diverticulosis without active inflammatory changes. There is moderate stool throughout the colon. There is no bowel obstruction. The appendix is not visualized with certainty. No inflammatory changes identified in the right lower quadrant. Vascular/Lymphatic: There is moderate aortoiliac atherosclerotic disease. Ectasia of the left common iliac artery measuring approximately 14 mm in diameter. Partially thrombosed 2.7 cm distal abdominal aortic ectasia. Reproductive: Hysterectomy. No pelvic mass. Other: Mild diffuse subcutaneous edema. No fluid collection. Musculoskeletal: Displaced fracture of the left femoral neck with mild proximal migration of the femoral shaft. There is advanced osteopenia. Old healed left pubic or fractures. Multilevel degenerative changes of the spine. Grade 1 L4-L5 anterolisthesis. IMPRESSION: 1. Inflammatory changes of the stomach may represent gastritis. Gastric ulcer is not entirely excluded. 2. A 17 x 31 mm contrast attenuation anterior to the right renal vein is not well characterized but may represent oral contrast within the duodenum. A bleeding ulcer is not excluded. Evaluation of the bowel is limited due to respiratory motion artifact. 3. Extensive sigmoid diverticulosis. No bowel obstruction. 4. Displaced fracture of the left femoral neck with mild proximal migration of the femoral shaft. 5. Aortic Atherosclerosis (ICD10-I70.0). 6. Small bilateral pleural effusions with associated bibasilar compressive atelectasis. Electronically Signed   By: Anner Crete M.D.   On: 07/30/2019 17:54   Dg Chest Port 1 View  Result Date: 08/01/2019 CLINICAL DATA:  Pre-op respiratory exam. EXAM: PORTABLE CHEST 1 VIEW COMPARISON:  07/28/2019 FINDINGS: Patient is rotated to the right. Heart size is stable. Dual lead transvenous pacemaker seen in appropriate position. Marked  elevation of the right hemidiaphragm is unchanged, and there is mild right basilar scarring or atelectasis. Left hilar lymph node calcification, consistent with old granulomatous disease. Left lung is well aerated and clear. No evidence of pneumothorax or pleural effusion. IMPRESSION: Stable elevation of right hemidiaphragm and mild right basilar scarring or atelectasis. No acute findings. Electronically Signed   By: Marlaine Hind M.D.   On: 08/01/2019 05:09   Dg Knee Left Port  Result Date: 07/31/2019 CLINICAL DATA:  PT in fetal position to the left with right leg on top of left. Legs fixed in that position. Pt had to be held for all imaging. Pt also uncooperative Best films attainable EXAM: PORTABLE LEFT KNEE - 1-2 VIEW COMPARISON:  None. FINDINGS: Status post LEFT knee arthroplasty. The hardware is intact. No acute fracture. Degenerative changes are identified at  the patellofemoral joint. No joint effusion. IMPRESSION: 1. No evidence for acute abnormality. 2. Hardware intact. Electronically Signed   By: Nolon Nations M.D.   On: 07/31/2019 10:22   Dg Hip Port Unilat With Pelvis 1v Left  Result Date: 07/31/2019 CLINICAL DATA:  PT in fetal position to the left with right leg on top of left. Legs fixed in that position. Pt had to be held for all imaging. Pt also uncooperative Best films attainable EXAM: DG HIP (WITH OR WITHOUT PELVIS) 1V PORT LEFT COMPARISON:  08/19/2018 and is 07/30/2019 CT FINDINGS: There is an displaced femoral neck fracture of the LEFT hip with foreshortening and varus angulation at the fracture site. Remote LEFT inferior pubic ramus fracture. CT residual exam. Contrast in the bladder following recent IMPRESSION: Acute LEFT femoral neck fracture. Electronically Signed   By: Nolon Nations M.D.   On: 07/31/2019 10:21    Positive ROS: All other systems have been reviewed and were otherwise negative with the exception of those mentioned in the HPI and as above.  Physical  Exam: General: Alert, no acute distress Cardiovascular: No pedal edema Respiratory: No cyanosis, no use of accessory musculature GI: No organomegaly, abdomen is soft and non-tender Skin: No lesions in the area of chief complaint Neurologic: Sensation intact distally Psychiatric: Patient currently nonverbal Lymphatic: No axillary or cervical lymphadenopathy  MUSCULOSKELETAL: Examination of the left hip reveals no skin wounds or lesions.  She is lying in the right lateral decubitus position with both hips and knees flexed terminally.  She has pain with attempted logrolling of the hip.  She refuses to participate in motor and sensory exam.  The foot is warm and well-perfused.  Assessment: Displaced left femoral neck fracture. Dementia.  Plan: I discussed the findings with the patient and her son, [name], who is a Stage manager, over the phone.  I recommend left hip hemiarthroplasty versus total hip arthroplasty.  We discussed the risk, benefits, and alternatives.  We discussed that surgery does have risk due to her advanced age and medical comorbidities.  However, the benefits outweigh the risks due to providing pain control and allowing her to mobilize more easily for care and transfers.  We discussed that without surgery, she would suffer terrible morbidity due to immobility.  He understands and wishes to proceed.  All questions were solicited and answered.  The risks, benefits, and alternatives were discussed with the patient. There are risks associated with the surgery including, but not limited to, problems with anesthesia (death), infection, instability (giving out of the joint), dislocation, differences in leg length/angulation/rotation, fracture of bones, loosening or failure of implants, hematoma (blood accumulation) which may require surgical drainage, blood clots, pulmonary embolism, nerve injury (foot drop and lateral thigh numbness), and blood vessel injury. The patient understands these  risks and elects to proceed.   Bertram Savin, MD Cell [phone]    08/01/2019 12:12 PM

## 2019-08-01 NOTE — Anesthesia Procedure Notes (Signed)
Spinal  Patient location during procedure: OR Start time: 08/01/2019 12:40 PM End time: 08/01/2019 12:50 PM Staffing Anesthesiologist: Murvin Natal, MD Performed: anesthesiologist  Preanesthetic Checklist Completed: patient identified, surgical consent, pre-op evaluation, timeout performed, IV checked, risks and benefits discussed and monitors and equipment checked Spinal Block Patient position: right lateral decubitus Prep: DuraPrep Patient monitoring: cardiac monitor, continuous pulse ox and blood pressure Approach: right paramedian Location: L4-5 Injection technique: single-shot Needle Needle type: Pencan  Needle gauge: 24 G Needle length: 9 cm Assessment Sensory level: T10 Additional Notes Functioning IV was confirmed and monitors were applied. Sterile prep and drape, including hand hygiene and sterile gloves were used. The patient was positioned and the spine was prepped. The skin was anesthetized with lidocaine.  Free flow of clear CSF was obtained prior to injecting local anesthetic into the CSF.  The spinal needle aspirated freely following injection.  The needle was carefully withdrawn.  The patient tolerated the procedure well.

## 2019-08-01 NOTE — Progress Notes (Signed)
SLP Cancellation Note  Patient Details Name: Victoria Lloyd MRN: [id number] DOB: Oct 31, 1922   Cancelled treatment:       Reason Eval/Treat Not Completed: Patient at procedure or test/unavailable; pt in surgery when ST attempted; will complete evaluation as able.   Elvina Sidle, M.S., [place] 08/01/2019, 1:42 PM

## 2019-08-01 NOTE — Progress Notes (Signed)
Notified son about md delay time for surgery this am per or nurse update to me on phone, son still states upset, will give report to nurse day shift to follow up surgery time.

## 2019-08-01 NOTE — Progress Notes (Signed)
Notified orthopedic surger on call for  Dr swinteck about pt has elevated temp and clarification order  Surgery time, md stated surgery time will be in the morning not 1am , will follow pre op order, new order noted

## 2019-08-01 NOTE — Progress Notes (Signed)
Son call to check on pt, update pt condition and time to go or, son sound upset that the surgery time have changed, discussed with him , verbalized understanding.

## 2019-08-01 NOTE — Op Note (Signed)
OPERATIVE REPORT  SURGEON: Rod Can, MD   ASSISTANT: April Green, RNFA  PREOPERATIVE DIAGNOSIS: Displaced Left femoral neck fracture.   POSTOPERATIVE DIAGNOSIS: Displaced Left femoral neck fracture.   PROCEDURE: Left hip hemiarthroplasty, anterior approach.   IMPLANTS: DePuy Tri Lock stem, size 2, hi offset, with a +0 mm spacer and a 46 mm monopolar head ball.  ANESTHESIA:  MAC and Spinal  ANTIBIOTICS: 2g ancef.  ESTIMATED BLOOD LOSS:-150 mL    DRAINS: None.  COMPLICATIONS: None   CONDITION: PACU - hemodynamically stable.   BRIEF CLINICAL NOTE: Victoria Lloyd is a 85 y.o. female with a displaced Left femoral neck fracture. The patient was admitted to the hospitalist service and underwent perioperative risk stratification and medical optimization. The risks, benefits, and alternatives to hemiarthroplasty were explained, and the patient elected to proceed.  PROCEDURE IN DETAIL: The patient was taken to the operating room and general anesthesia was induced on the hospital bed.  The patient was then positioned on the Hana table.  All bony prominences were well padded.  The hip was prepped and draped in the normal sterile surgical fashion.  A time-out was called verifying side and site of surgery. Antibiotics were given within 60 minutes of beginning the procedure.   The direct anterior approach to the hip was performed through the Hueter interval.  Lateral femoral circumflex vessels were treated with the Auqumantys. The anterior capsule was exposed and an inverted T capsulotomy was made.  Fracture hematoma was encountered and evacuated. The patient was found to have a comminuted Left subcapital femoral neck fracture.  I freshened the femoral neck cut with a saw.  I removed the femoral neck fragment.  A corkscrew was placed into the head and the head was removed.  This was passed to the back table and was measured.   Acetabular exposure was achieved.  I examined the  articular cartilage which was intact.  The labrum was intact. A 46 mm trial head was placed and found to have excellent fit.   I then gained femoral exposure taking care to protect the abductors and greater trochanter.  This was performed using standard external rotation, extension, and adduction.  The capsule was peeled off the inner aspect of the greater trochanter, taking care to preserve the short external rotators. A cookie cutter was used to enter the femoral canal, and then the femoral canal finder was used to confirm location.  I then sequentially broached up to a size 2.  Calcar planer was used on the femoral neck remnant.  I paced a hi neck and a 36+1.5 head ball. The hip was reduced.  Leg lengths were checked fluoroscopically.  The hip was dislocated and trial components were removed.  I placed the real stem followed by the real spacer and head ball.  A single reduction maneuver was performed and the hip was reduced.  Fluoroscopy was used to confirm component position and leg lengths.  At 90 degrees of external rotation and extension, the hip was stable to an anterior directed force.   The wound was copiously irrigated with Irrisept solution and normal saline using pule lavage.  Marcaine solution was injected into the periarticular soft tissue.  The wound was closed in layers using #1 Vicryl and V-Loc for the fascia, 2-0 Vicryl for the subcutaneous fat, 2-0 Monocryl for the deep dermal layer, 3-0 running Monocryl subcuticular stitch and glue for the skin.  Once the glue was fully dried, an Aquacell Ag dressing was applied.  The  patient was then awakened from anesthesia and transported to the recovery room in stable condition.  Sponge, needle, and instrument counts were correct at the end of the case x2.  The patient tolerated the procedure well and there were no known complications.

## 2019-08-01 NOTE — Anesthesia Postprocedure Evaluation (Signed)
Anesthesia Post Note  Patient: Victoria Lloyd  Procedure(s) Performed: ANTERIOR APPROACH HEMI HIP ARTHROPLASTY (Left Hip)     Patient location during evaluation: PACU Anesthesia Type: Spinal Level of consciousness: responds to stimulation Pain management: pain level controlled Vital Signs Assessment: post-procedure vital signs reviewed and stable Respiratory status: spontaneous breathing, respiratory function stable and patient connected to nasal cannula oxygen Cardiovascular status: blood pressure returned to baseline and stable Postop Assessment: no headache, no backache, no apparent nausea or vomiting and spinal receding Anesthetic complications: no    Last Vitals:  Vitals:   08/01/19 1712 08/01/19 1735  BP: (!) 121/56 (!) 122/54  Pulse:  65  Resp:  16  Temp:  36.9 C  SpO2:  100%    Last Pain:  Vitals:   08/01/19 1755  TempSrc:   PainSc: 0-No pain                 Ryan P Ellender

## 2019-08-02 ENCOUNTER — Encounter (HOSPITAL_COMMUNITY): Payer: Self-pay | Admitting: Orthopedic Surgery

## 2019-08-02 DIAGNOSIS — M79604 Pain in right leg: Secondary | ICD-10-CM

## 2019-08-02 DIAGNOSIS — X58XXXA Exposure to other specified factors, initial encounter: Secondary | ICD-10-CM

## 2019-08-02 DIAGNOSIS — N183 Chronic kidney disease, stage 3 (moderate): Secondary | ICD-10-CM

## 2019-08-02 DIAGNOSIS — M79605 Pain in left leg: Secondary | ICD-10-CM

## 2019-08-02 DIAGNOSIS — S72142A Displaced intertrochanteric fracture of left femur, initial encounter for closed fracture: Principal | ICD-10-CM

## 2019-08-02 DIAGNOSIS — F0390 Unspecified dementia without behavioral disturbance: Secondary | ICD-10-CM

## 2019-08-02 DIAGNOSIS — R509 Fever, unspecified: Secondary | ICD-10-CM

## 2019-08-02 DIAGNOSIS — I442 Atrioventricular block, complete: Secondary | ICD-10-CM

## 2019-08-02 DIAGNOSIS — R451 Restlessness and agitation: Secondary | ICD-10-CM

## 2019-08-02 DIAGNOSIS — R4182 Altered mental status, unspecified: Secondary | ICD-10-CM

## 2019-08-02 LAB — CBC
HCT: 33.6 % — ABNORMAL LOW (ref 36.0–46.0)
Hemoglobin: 10.5 g/dL — ABNORMAL LOW (ref 12.0–15.0)
MCH: 28.4 pg (ref 26.0–34.0)
MCHC: 31.3 g/dL (ref 30.0–36.0)
MCV: 90.8 fL (ref 80.0–100.0)
Platelets: 187 10*3/uL (ref 150–400)
RBC: 3.7 MIL/uL — ABNORMAL LOW (ref 3.87–5.11)
RDW: 15 % (ref 11.5–15.5)
WBC: 10.5 10*3/uL (ref 4.0–10.5)
nRBC: 0 % (ref 0.0–0.2)

## 2019-08-02 LAB — BASIC METABOLIC PANEL
Anion gap: 10 (ref 5–15)
BUN: 28 mg/dL — ABNORMAL HIGH (ref 8–23)
CO2: 21 mmol/L — ABNORMAL LOW (ref 22–32)
Calcium: 8.6 mg/dL — ABNORMAL LOW (ref 8.9–10.3)
Chloride: 117 mmol/L — ABNORMAL HIGH (ref 98–111)
Creatinine, Ser: 0.9 mg/dL (ref 0.44–1.00)
GFR calc Af Amer: >60 mL/min (ref >60)
GFR calc non Af Amer: 54 mL/min — ABNORMAL LOW (ref >60)
Glucose, Bld: 134 mg/dL — ABNORMAL HIGH (ref 70–99)
Potassium: 4.2 mmol/L (ref 3.5–5.1)
Sodium: 148 mmol/L — ABNORMAL HIGH (ref 135–145)

## 2019-08-02 LAB — CULTURE, BLOOD (ROUTINE X 2)
Culture: NO GROWTH
Culture: NO GROWTH
Special Requests: ADEQUATE

## 2019-08-02 MED ORDER — ENOXAPARIN SODIUM 30 MG/0.3ML ~~LOC~~ SOLN: 30.0000 mg | SUBCUTANEOUS | 0 refills | Status: DC

## 2019-08-02 MED ORDER — OXYCODONE HCL 5 MG PO TABS: 5.0000 mg | ORAL_TABLET | Freq: Four times a day (QID) | ORAL | 0 refills | Status: AC | PRN

## 2019-08-02 MED ORDER — RISPERIDONE 1 MG/ML PO SOLN
1.0000 mg | Freq: Every day | ORAL | Status: DC
  Administered 2019-08-02: 22:00:00 1 mg via ORAL
  Filled 2019-08-02: qty 1

## 2019-08-02 MED ORDER — INFLUENZA VAC A&B SA ADJ QUAD 0.5 ML IM PRSY
0.5000 mL | PREFILLED_SYRINGE | INTRAMUSCULAR | Status: AC
  Administered 2019-08-03: 0.5 mL via INTRAMUSCULAR
  Filled 2019-08-02: qty 0.5

## 2019-08-02 NOTE — Progress Notes (Addendum)
PT Cancellation Note  Patient Details Name: Victoria Lloyd MRN: [id number] DOB: [birth year]   Cancelled Treatment:    Reason Eval/Treat Not Completed: Other (comment). Patient with baseline dementia, agitated with any attempts at mobility. Will re-attempt at a later date.    Murray Durrell 08/02/2019, 11:19 AM

## 2019-08-02 NOTE — Progress Notes (Signed)
  Speech Language Pathology Treatment: Dysphagia  Patient Details Name: Victoria Lloyd MRN: [id number] DOB: [birth year] Today's Date: 08/02/2019 Time: [id number] SLP Time Calculation (min) (ACUTE ONLY): 10 min  Assessment / Plan / Recommendation Clinical Impression  Pt was cooperative this afternoon with SLP, eating and drinking but not wanting to self-feed. Note that diet had been advanced to regular solids after surgery. She has a wet vocal quality at baseline that clears and remains clear throughout PO intake. Oral preparation is prolonged with Min cues needed for sustained attention. Mod cues were given for clearance of oral residuals. No overt signs of aspiration were noted. Recommend adjusting diet back to Dys 2 solids and thin liquids. Will continue to follow briefly, as I suspect that this diet may be more appropriate longer-term given cognitively-based impairments.   HPI HPI: Victoria Lloyd is a 85 year old female who presented from a memory care facility with fever, AMS, and hypotension.  PMH includes: HTN, CKD, CHF, and pacemaker.  Per son report, pt speaks little at baseline, but she is currently non-verbal.         SLP Plan  Continue with current plan of care       Recommendations  Diet recommendations: Dysphagia 2 (fine chop);Thin liquid Liquids provided via: Cup;Straw Medication Administration: Crushed with puree Supervision: Patient able to self feed;Full supervision/cueing for compensatory strategies Compensations: Slow rate;Small sips/bites;Follow solids with liquid Postural Changes and/or Swallow Maneuvers: Seated upright 90 degrees                Oral Care Recommendations: Oral care BID Follow up Recommendations: Skilled Nursing facility SLP Visit Diagnosis: Dysphagia, unspecified (R13.10) Plan: Continue with current plan of care       GO                Venita Sheffield Nixon [place] 08/02/2019, 4:35 PM  Pollyann Glen, M.A. [place] Acute Environmental education officer  [phone] Office [phone]

## 2019-08-02 NOTE — Progress Notes (Signed)
  Date: 08/02/2019  Patient name: Victoria Lloyd  Medical record number: [id number]  Date of birth: September 09, 1922        I have seen and evaluated this patient and I have discussed the plan of care with the house staff. Please see Dr. Webb Silversmith note for complete details. I concur with her findings and plan.  I am concerned that she will not do well if she does not allow for physical therapy.  Her mobility will be severely limited.  Will need to discuss with family if she continues to decline therapy.     Sid Falcon, MD 08/02/2019, 4:05 PM

## 2019-08-02 NOTE — Progress Notes (Signed)
Subjective: Pt is awake and alert but agitated and fearful during rounds today. She reports bilateral leg pain but becomes very agitated when you ask to examine her. She says she doesn't know where she is.   Consults: Ortho  Objective:  Vital signs in last 24 hours: Vitals:   08/01/19 1735 08/01/19 2004 08/01/19 2344 08/02/19 0354  BP: (!) 122/54 127/60 132/82 (!) 119/57  Pulse: 65 69 65 60  Resp: 16 16 18 18   Temp: 98.4 F (36.9 C) 98.9 F (37.2 C) 98.8 F (37.1 C) 98.4 F (36.9 C)  TempSrc: Oral  Oral Axillary  SpO2: 100% 100% 100% 93%  Weight:      Height:       Physical Exam  Neurological: She is alert.  Says she doesn't know where she is  Skin: She is not diaphoretic.  Psychiatric:  Appears confused and anxious  Nursing note and vitals reviewed. Declined physical exam  Labs:  CMP Latest Ref Rng & Units 08/02/2019 08/01/2019 07/31/2019  Glucose 70 - 99 mg/dL 134(H) 142(H) 111(H)  BUN 8 - 23 mg/dL 28(H) 26(H) 26(H)  Creatinine 0.44 - 1.00 mg/dL 0.90 1.05(H) 1.05(H)  Sodium 135 - 145 mmol/L 148(H) 146(H) 146(H)  Potassium 3.5 - 5.1 mmol/L 4.2 4.2 3.9  Chloride 98 - 111 mmol/L 117(H) 116(H) 116(H)  CO2 22 - 32 mmol/L 21(L) 19(L) 18(L)  Calcium 8.9 - 10.3 mg/dL 8.6(L) 8.8(L) 9.0  Total Protein 6.5 - 8.1 g/dL - - -  Total Bilirubin 0.3 - 1.2 mg/dL - - -  Alkaline Phos 38 - 126 U/L - - -  AST 15 - 41 U/L - - -  ALT 0 - 44 U/L - - -   CBC Latest Ref Rng & Units 08/02/2019 08/01/2019 07/31/2019  WBC 4.0 - 10.5 K/uL 10.5 10.4 12.4(H)  Hemoglobin 12.0 - 15.0 g/dL 10.5(L) 12.6 12.4  Hematocrit 36.0 - 46.0 % 33.6(L) 40.1 39.0  Platelets 150 - 400 K/uL 187 234 293    Assessment/Plan:  Assessment: Ms. Alves is our 85 yo F w/ a hx of dementia, HTN, CKD III, HFpEF, 3rd degree heart block s/p pacemaker placement who presented from her nursing home with a fever of 100.5 and AMS treated for presumed urosepsis whose continued fevers prompted imaging of her abdomen demonstrating a  left femoral neck fx s/p Left hemi hip arthroplasty.   Plan:  Principal Problem:   Intertrochanteric fracture of left hip (HCC) -as part of her workup for fever/abdominal pain, a left femoral neck fx was incidentally found on CT  -son states she had a fall last month w/ out evidence of fx; no falls while in the hospital -s/p left hemi hip arthroplasty - patient reports bilateral leg pain today but fervently declined physical exam -oxy IR PRN for pain control  -PT/OT ordered but will unlikely be able to complete  Active Problems:   Dementia: -given patient appears more agitated and anxious, will restart home Risperdal at 1 mg    Fever/Altered mental status  -pt presented with fever of 100.5 at her nursing home, initial workup concerning for urosepsis for which she received broad spectrum antibiotics narrowed to ceftriaxone alone. Blood cx NGTD and urine cx showed insignificant growth. Patient has endorsed abdominal pain, suprapubic in location originally thought to be related to a UTI, so CT abdomen ordered for additional fever workup which showed no evidence of an intra-abdominal infection. Given no clear source, antibiotics dc'ed. Patient afebrile > 24 hours.  -WBC WNL  at 10.5 today  -continue to trend fever curve -daily CBC -if she fevers again may consider repeat blood cx    Chronic HFpEF: -stable, grossly euvolemic but pt declined formal physical exam; on home metop but lower dose of 12.5; pt has had low to normal bps  Dispo: Anticipated discharge pending clinical course.  Al Decant, MD 08/02/2019, 10:46 AM Pager: [phone]

## 2019-08-02 NOTE — Progress Notes (Signed)
    Subjective:  Patient reports pain as mild.  Denies N/V/CP/SOB.   Objective:   VITALS:   Vitals:   08/01/19 1735 08/01/19 2004 08/01/19 2344 08/02/19 0354  BP: (!) 122/54 127/60 132/82 (!) 119/57  Pulse: 65 69 65 60  Resp: 16 16 18 18   Temp: 98.4 F (36.9 C) 98.9 F (37.2 C) 98.8 F (37.1 C) 98.4 F (36.9 C)  TempSrc: Oral  Oral Axillary  SpO2: 100% 100% 100% 93%  Weight:      Height:        NAD, confused ABD soft Sensation intact distally Dorsiflexion/Plantar flexion intact Incision: dressing C/D/I Unable to assess sensory due to MS  Lab Results  Component Value Date   WBC 10.5 08/02/2019   HGB 10.5 (L) 08/02/2019   HCT 33.6 (L) 08/02/2019   MCV 90.8 08/02/2019   PLT 187 08/02/2019   BMET    Component Value Date/Time   NA 148 (H) 08/02/2019 0348   NA 141 04/27/2016   K 4.2 08/02/2019 0348   CL 117 (H) 08/02/2019 0348   CO2 21 (L) 08/02/2019 0348   GLUCOSE 134 (H) 08/02/2019 0348   BUN 28 (H) 08/02/2019 0348   BUN 20 04/27/2016   CREATININE 0.90 08/02/2019 0348   CALCIUM 8.6 (L) 08/02/2019 0348   GFRNONAA 54 (L) 08/02/2019 0348   GFRAA >60 08/02/2019 0348     Assessment/Plan: 1 Day Post-Op   Principal Problem:   Intertrochanteric fracture of left hip (HCC) Active Problems:   Fever   Altered mental status   Dementia ([place])   WBAT with walker DVT ppx: Lovenox, SCDs, TEDS PO pain control PT/OT Dispo: D/C planning   Hilton Cork Osha Errico 08/02/2019, 11:52 AM   Rod Can, MD Cell: [phone] [place] is now [facility]  [place] [address], [place], [place] [address] Phone: [phone] [email or web address] Facebook  [employer]

## 2019-08-03 DIAGNOSIS — R1013 Epigastric pain: Secondary | ICD-10-CM

## 2019-08-03 LAB — BASIC METABOLIC PANEL
Anion gap: 8 (ref 5–15)
BUN: 27 mg/dL — ABNORMAL HIGH (ref 8–23)
CO2: 22 mmol/L (ref 22–32)
Calcium: 8.7 mg/dL — ABNORMAL LOW (ref 8.9–10.3)
Chloride: 116 mmol/L — ABNORMAL HIGH (ref 98–111)
Creatinine, Ser: 0.92 mg/dL (ref 0.44–1.00)
GFR calc Af Amer: >60 mL/min (ref >60)
GFR calc non Af Amer: 53 mL/min — ABNORMAL LOW (ref >60)
Glucose, Bld: 149 mg/dL — ABNORMAL HIGH (ref 70–99)
Potassium: 3.5 mmol/L (ref 3.5–5.1)
Sodium: 146 mmol/L — ABNORMAL HIGH (ref 135–145)

## 2019-08-03 LAB — CBC
HCT: 34.6 % — ABNORMAL LOW (ref 36.0–46.0)
Hemoglobin: 11 g/dL — ABNORMAL LOW (ref 12.0–15.0)
MCH: 28.9 pg (ref 26.0–34.0)
MCHC: 31.8 g/dL (ref 30.0–36.0)
MCV: 90.8 fL (ref 80.0–100.0)
Platelets: 248 10*3/uL (ref 150–400)
RBC: 3.81 MIL/uL — ABNORMAL LOW (ref 3.87–5.11)
RDW: 15.1 % (ref 11.5–15.5)
WBC: 10.8 10*3/uL — ABNORMAL HIGH (ref 4.0–10.5)
nRBC: 0 % (ref 0.0–0.2)

## 2019-08-03 MED ORDER — RISPERIDONE 1 MG/ML PO SOLN
1.2500 mg | Freq: Every day | ORAL | Status: DC
  Administered 2019-08-03 – 2019-08-08 (×5): 1.3 mg via ORAL
  Filled 2019-08-03 (×8): qty 1.3

## 2019-08-03 MED ORDER — PANTOPRAZOLE SODIUM 20 MG PO TBEC
20.0000 mg | DELAYED_RELEASE_TABLET | Freq: Two times a day (BID) | ORAL | Status: DC
  Administered 2019-08-03 – 2019-08-09 (×13): 20 mg via ORAL
  Filled 2019-08-03 (×14): qty 1

## 2019-08-03 NOTE — Progress Notes (Signed)
Pt is able to bring a loaded spoon to her mouth, she is otherwise dependent in ADL and requires +2 total assist for all mobility. No further acute OT needs. Recommend SNF.   08/03/19 1310  OT Visit Information  Last OT Received On 08/03/19  Assistance Needed +2  PT/OT/SLP Co-Evaluation/Treatment Yes  Reason for Co-Treatment For patient/therapist safety  OT goals addressed during session ADL's and self-care  History of Present Illness 85yo female presenting to the hospital with sepsis due to UTI and abdominal pain. CT + for L femoral neck fracture. She received L THR on 08/01/19. PMH CHF, CKD stage 4, complete heart block s/p PPM, HTN, CVA, severe dementia  Precautions  Precautions Fall  Precaution Booklet Issued No  Restrictions  Weight Bearing Restrictions Yes  LLE Weight Bearing WBAT  Home Living  Family/patient expects to be discharged to: Unsure  Additional Comments Pt is a poor historian, no family at bedside for additional information.  Prior Function  Level of Independence Needs assistance  Comments patient in memory unit of facility  Communication  Communication Expressive difficulties (very little verbalizing)  Pain Assessment  Pain Assessment Faces  Faces Pain Scale 6  Pain Location left leg with movement  Pain Descriptors / Indicators Discomfort;Guarding  Pain Intervention(s) Repositioned  Cognition  Arousal/Alertness Awake/alert  Behavior During Therapy Flat affect  Overall Cognitive Status History of cognitive impairments - at baseline  General Comments severe dementia at baseline  Upper Extremity Assessment  Upper Extremity Assessment Generalized weakness (incoordination)  Lower Extremity Assessment  Lower Extremity Assessment Defer to PT evaluation  Cervical / Trunk Assessment  Cervical / Trunk Assessment Kyphotic  ADL  General ADL Comments pt can bringing loaded spoon to her mouth to eat, otherwise dependent  Vision- Assessment  Additional Comments appears  intact grossly  Bed Mobility  Bed Mobility Supine to Sit  Supine to sit Total assist;+2 for physical assistance  General bed mobility comments totalA to pivot to EOB, patient mostly agreeable but did intermittentlly call out due to pain  Transfers  Overall transfer level Needs assistance  Equipment used 2 person hand held assist  Transfers Sit to/from Stand;Squat Pivot Transfers  Sit to Stand Total assist;+2 physical assistance  Squat pivot transfers +2 physical assistance;Total assist  General transfer comment totalAx2 for pivot to chair, patient with no initiation or effort  Balance  Overall balance assessment Needs assistance  Sitting balance-Leahy Scale Poor  Sitting balance - Comments posterior lean, MinA for upright  Standing balance-Leahy Scale Zero  OT - End of Session  Equipment Utilized During Treatment Gait belt  Activity Tolerance Patient tolerated treatment well  Patient left in chair;with call bell/phone within reach;with chair alarm set  Nurse Communication Need for lift equipment;Mobility status  OT Assessment  OT Recommendation/Assessment Patient does not need any further OT services  OT Visit Diagnosis Pain;Muscle weakness (generalized) (M62.81);Cognitive communication deficit (R41.841);Other symptoms and signs involving cognitive function  AM-PAC OT "6 Clicks" Daily Activity Outcome Measure (Version 2)  Help from another person eating meals? 2  Help from another person taking care of personal grooming? 1  Help from another person toileting, which includes using toliet, bedpan, or urinal? 1  Help from another person bathing (including washing, rinsing, drying)? 1  Help from another person to put on and taking off regular upper body clothing? 1  Help from another person to put on and taking off regular lower body clothing? 1  6 Click Score 7  OT Recommendation  Follow Up  Recommendations SNF;Supervision/Assistance - 24 hour  OT Equipment None recommended by OT  OT  Time Calculation  OT Start Time (ACUTE ONLY) 1140  OT Stop Time (ACUTE ONLY) 1231  OT Time Calculation (min) 51 min  OT General Charges  $OT Visit 1 Visit  OT Evaluation  $OT Eval Moderate Complexity 1 Mod  OT Treatments  $Self Care/Home Management  8-22 mins  Written Expression  Dominant Hand Right  Nestor Lewandowsky, OTR/L Acute Rehabilitation Services Pager: [phone] Office: [phone]

## 2019-08-03 NOTE — Progress Notes (Signed)
  Date: 08/03/2019  Patient name: Victoria Lloyd  Medical record number: [id number]  Date of birth: Sep 08, 1922        I have seen and evaluated this patient and I have discussed the plan of care with the house staff. Please see Dr. Webb Silversmith note for complete details. I concur with her findings and plan.    Sid Falcon, MD 08/03/2019, 3:33 PM

## 2019-08-03 NOTE — TOC Initial Note (Signed)
Transition of Care (TOC) - Initial/Assessment Note (Progression Note)    Patient Details  Name: Victoria Lloyd MRN: [id number] Date of Birth: 1922/09/11  Transition of Care [facility]) CM/SW Contact:    Sable Feil, LCSW Phone Number: 08/03/2019, 7:11 PM  Clinical Narrative: On 9/8 Son expressed concern regarding his mother's broken hip as he reported that she does not walk, gets around in a wheelchair, and he does not understand what happened to cause the hip fracture. Mr. Shupe indicated that he wants his mother to return to the ALF as this is familiar surroundings for her and this d/c option discussed. Son was encouraged to call and speak with nursing director at the memory care unit.  On 9/9 Mr. Amirian informed CSW that he has talked with his mother regarding participating in rehab as she did not work with PT today. Son reported that he talked with Fulton Mole, Director at the [facility] regarding his mother returning and he will also talk with the rehab director [place] regarding how they can work with his mother. Son does not think that his mother will need a lot of rehab as she uses a wheelchair and was getting assistance with transfers prior to this admissions. Son was encouraged to talk with someone in rehab at the ALF and also in billings regarding payment for PT/OT working with his mom upon return from hospital.                  Expected Discharge Plan: Assisted Living([facility]) Barriers to Discharge: Continued Medical Work up   Patient Goals and CMS Choice Patient states their goals for this hospitalization and ongoing recovery are:: For patient to continue recuperation and work with PT/OT in a familiar environment CMS Medicare.gov Compare Post Acute Care list provided to:: Other (Comment Required)(Son provided with information in accessing SNF from [email or web address]) Choice offered to / list presented to : Patient(Son informed about [email or web address])  Expected Discharge  Plan and Services Expected Discharge Plan: Assisted Living([facility]) In-house Referral: Clinical Social Work Discharge Planning Services: Other - See comment(HH, Equipement, PT/OT if needed) Post Acute Care Choice: Durable Medical Equipment, Home Health(Equipment and [facility] services if neded at [facility]) Living arrangements for the past 2 months: Assisted Living Facility(Patient a resident at [facility])                                      Prior Living Arrangements/Services Living arrangements for the past 2 months: Assisted Living Facility(Patient a resident at [facility]) Lives with:: [facility]) Patient language and need for interpreter reviewed:: No Do you feel safe going back to the place where you live?: Yes(Son feels that due to his mother's dementia, a familiar environment will be better for his mother)      Need for Family Participation in Patient Care: Yes (Comment) Care giver support system in place?: Yes (comment)   Criminal Activity/Legal Involvement Pertinent to Current Situation/Hospitalization: No - Comment as needed  Activities of Daily Living Home Assistive Devices/Equipment: None ADL Screening (condition at time of admission) Patient's cognitive ability adequate to safely complete daily activities?: No Is the patient deaf or have difficulty hearing?: No Does the patient have difficulty seeing, even when wearing glasses/contacts?: No Does the patient have difficulty concentrating, remembering, or making decisions?: Yes Patient able to express need for assistance with ADLs?: No  Does the patient have difficulty dressing or bathing?: Yes Independently performs ADLs?: No Communication: Needs assistance Is this a change from baseline?: Pre-admission baseline Dressing (OT): Needs assistance Is this a change from baseline?: Pre-admission baseline Grooming: Needs assistance Is this a change  from baseline?: Pre-admission baseline Feeding: Needs assistance Is this a change from baseline?: Pre-admission baseline Bathing: Needs assistance Is this a change from baseline?: Pre-admission baseline Toileting: Needs assistance Is this a change from baseline?: Pre-admission baseline In/Out Bed: Needs assistance Is this a change from baseline?: Pre-admission baseline Walks in Home: Needs assistance Is this a change from baseline?: Pre-admission baseline Does the patient have difficulty walking or climbing stairs?: Yes(fractured hip) Weakness of Legs: Both Weakness of Arms/Hands: Both  Permission Sought/Granted Permission sought to share information with : Case Manager, Customer service manager, Family Supports Permission granted to share information with : No(Patient unab le to give consent)  Share Information with NAME: [name]  Permission granted to share info w AGENCY: SNFs/ALF  Permission granted to share info w Relationship: son  Permission granted to share info w Contact Information: [phone]  Emotional Assessment Appearance:: Other (Comment Required(Did not visit with patient, talked with son by phone) Attitude/Demeanor/Rapport: Unable to Assess Affect (typically observed): Unable to Assess Orientation: : Oriented to Self Alcohol / Substance Use: Other (comment)(Information not available) Psych Involvement: No (comment)  Admission diagnosis:  Hypokalemia [E87.6] Delirium [R41.0] Fever of unknown origin (FUO) [R50.9] Patient Active Problem List   Diagnosis Date Noted  . Intertrochanteric fracture of left hip ([place]) 07/31/2019  . Dementia ([place]) 07/31/2019  . Altered mental status 07/28/2019  . Acute encephalopathy 08/19/2018  . Fall at home, initial encounter 02/02/2018  . Fever of unknown origin (FUO) 02/01/2018  . UTI (lower urinary tract infection)   . Anxiety 06/03/2016  . SOB (shortness of breath) 06/03/2016  . Elevated troponin 06/03/2016  . CHF  exacerbation ([place]) 06/03/2016  . Acute on chronic diastolic (congestive) heart failure ([place])   . Syncope 04/24/2016  . Pacemaker- St Jude impalnted 08/04/14 08/06/2014  . AV block, 3rd degree (HCC) 08/03/2014  . Acute CHF- secondary to bradycardia on admision (Nl LVF by echo) 08/03/2014  . CKD (chronic kidney disease) stage 4, GFR 15-29 ml/min (HCC) 08/03/2014  . Bradycardia 08/03/2014  . Hypertension 03/15/2011   PCP:  Patient, No Pcp Per Pharmacy:  No Pharmacies Listed    Social Determinants of Health (SDOH) Interventions    Readmission Risk Interventions No flowsheet data found.

## 2019-08-03 NOTE — Progress Notes (Signed)
Pt was unable to void/ Bladder scan showed 300cc urine./ I&O cath performed and 300cc urine returned.

## 2019-08-03 NOTE — Care Management Important Message (Signed)
Important Message  Patient Details  Name: Victoria Lloyd MRN: [id number] Date of Birth: 08-15-1922   Medicare Important Message Given:  Yes     Memory Argue 08/03/2019, 4:12 PM

## 2019-08-03 NOTE — Progress Notes (Signed)
    Subjective:  Patient reports pain as mild.  Denies N/V/CP/SOB.   Objective:   VITALS:   Vitals:   08/02/19 2039 08/03/19 0428 08/03/19 0500 08/03/19 1519  BP: 110/61 96/71 (!) 109/42 (!) 106/56  Pulse: 78 80  71  Resp: 14 13 18 17   Temp: 99 F (37.2 C) 98.4 F (36.9 C)  98.4 F (36.9 C)  TempSrc: Oral Oral  Oral  SpO2: 98% 99% 100% 100%  Weight:      Height:        NAD, confused ABD soft Sensation intact distally Dorsiflexion/Plantar flexion intact Incision: dressing C/D/I Unable to assess sensory due to MS  Lab Results  Component Value Date   WBC 10.8 (H) 08/03/2019   HGB 11.0 (L) 08/03/2019   HCT 34.6 (L) 08/03/2019   MCV 90.8 08/03/2019   PLT 248 08/03/2019   BMET    Component Value Date/Time   NA 146 (H) 08/03/2019 0356   NA 141 04/27/2016   K 3.5 08/03/2019 0356   CL 116 (H) 08/03/2019 0356   CO2 22 08/03/2019 0356   GLUCOSE 149 (H) 08/03/2019 0356   BUN 27 (H) 08/03/2019 0356   BUN 20 04/27/2016   CREATININE 0.92 08/03/2019 0356   CALCIUM 8.7 (L) 08/03/2019 0356   GFRNONAA 53 (L) 08/03/2019 0356   GFRAA >60 08/03/2019 0356     Assessment/Plan: 2 Days Post-Op   Principal Problem:   Intertrochanteric fracture of left hip (HCC) Active Problems:   Fever of unknown origin (FUO)   Altered mental status   Dementia ([place])   WBAT with walker DVT ppx: Lovenox, SCDs, TEDS PO pain control PT/OT Dispo: D/C planning   Hilton Cork Chabeli Barsamian 08/03/2019, 3:41 PM   Rod Can, MD Cell: [phone] [facility]  [place] [address], [place], [place] [address] Phone: [phone] [email or web address] Facebook  [employer]

## 2019-08-03 NOTE — Progress Notes (Signed)
Responded to [facility] to provide support to patient. Patient sleeping. Stood over patient and offered prayer. Nurse will let patient know that Chaplain came when she awakes.  Chaplain will follow as needed.  Jaclynn Major, [place], [facility], Pager [phone]

## 2019-08-03 NOTE — Evaluation (Signed)
Physical Therapy Evaluation Patient Details Name: Victoria Lloyd MRN: [id number] DOB: [birth year] Today's Date: 08/03/2019   History of Present Illness  85yo female presenting to the hospital with sepsis due to UTI and abdominal pain. CT + for L femoral neck fracture. She received L THR on 08/01/19. PMH CHF, CKD stage 4, complete heart block s/p PPM, HTN, CVA, severe dementia  Clinical Impression   Patient received in bed, asleep but appearing to be willing to participate in PT session today. Mostly non-verbal except for occasional yes/no or saying "...wait, wait". She required totalAx2 for bed mobility, as well as MinA to maintain upright at EOB due to posterior lean. Also required totalAx2 for stand-pivot transfer to chair, as well as repositioning in the chair today. RN aware of mobility status and rehab recommendation for use of lift equipment for back to bed. She was left up in the chair with all needs met, and OT present and attending. She will continue to benefit from skilled PT services in the acute setting, however do recommend ST-SNF to further address mobility deficits moving forward.     Follow Up Recommendations SNF    Equipment Recommendations  None recommended by PT(defer to next venue)    Recommendations for Other Services       Precautions / Restrictions Precautions Precautions: Fall;ICD/Pacemaker;Anterior Hip Precaution Booklet Issued: No Restrictions Weight Bearing Restrictions: Yes LLE Weight Bearing: Weight bearing as tolerated      Mobility  Bed Mobility Overal bed mobility: Needs Assistance Bed Mobility: Supine to Sit     Supine to sit: Total assist;+2 for physical assistance     General bed mobility comments: totalA to pivot to EOB, patient mostly agreeable but did intermittentlly call out due to pain  Transfers Overall transfer level: Needs assistance Equipment used: 2 person hand held assist Transfers: Sit to/from [employer] Transfers Sit to  Stand: Total assist;+2 physical assistance Stand pivot transfers: Total assist;+2 physical assistance       General transfer comment: totalAx2 for pivot to chair, patient with no initiation or effort  Ambulation/Gait             General Gait Details: unable  Stairs            Wheelchair Mobility    Modified Rankin (Stroke Patients Only)       Balance Overall balance assessment: Needs assistance Sitting-balance support: Bilateral upper extremity supported;Feet supported Sitting balance-Leahy Scale: Poor Sitting balance - Comments: posterior lean, MinA for upright   Standing balance support: No upper extremity supported;During functional activity Standing balance-Leahy Scale: Zero Standing balance comment: fully reliant on external support                             Pertinent Vitals/Pain Pain Assessment: Faces Faces Pain Scale: Hurts even more Pain Location: left leg with movement Pain Descriptors / Indicators: Discomfort;Guarding Pain Intervention(s): Limited activity within patient's tolerance;Monitored during session;Repositioned    Home Living Family/patient expects to be discharged to:: Unsure                 Additional Comments: Pt is a poor historian, no family at bedside for additional information.    Prior Function Level of Independence: Needs assistance         Comments: patient in memory unit of facility     Hand Dominance   Dominant Hand: Right    Extremity/Trunk Assessment   Upper Extremity Assessment Upper Extremity Assessment:  Defer to OT evaluation    Lower Extremity Assessment Lower Extremity Assessment: Generalized weakness    Cervical / Trunk Assessment Cervical / Trunk Assessment: Kyphotic  Communication   Communication: No difficulties  Cognition Arousal/Alertness: Lethargic Behavior During Therapy: Flat affect Overall Cognitive Status: History of cognitive impairments - at baseline                                  General Comments: severe dementia at baseline      General Comments      Exercises     Assessment/Plan    PT Assessment Patient needs continued PT services  PT Problem List Decreased strength;Decreased mobility;Decreased safety awareness;Decreased coordination;Decreased knowledge of precautions;Decreased cognition;Decreased activity tolerance;Decreased balance;Decreased knowledge of use of DME;Pain       PT Treatment Interventions DME instruction;Therapeutic activities;Gait training;Therapeutic exercise;Patient/family education;Stair training;Balance training;Functional mobility training;Neuromuscular re-education    PT Goals (Current goals can be found in the Care Plan section)  Acute Rehab PT Goals PT Goal Formulation: Patient unable to participate in goal setting    Frequency Min 2X/week   Barriers to discharge        Co-evaluation PT/OT/SLP Co-Evaluation/Treatment: Yes Reason for Co-Treatment: For patient/therapist safety;To address functional/ADL transfers;Necessary to address cognition/behavior during functional activity PT goals addressed during session: Mobility/safety with mobility         AM-PAC PT "6 Clicks" Mobility  Outcome Measure Help needed turning from your back to your side while in a flat bed without using bedrails?: A Lot Help needed moving from lying on your back to sitting on the side of a flat bed without using bedrails?: Total Help needed moving to and from a bed to a chair (including a wheelchair)?: Total Help needed standing up from a chair using your arms (e.g., wheelchair or bedside chair)?: Total Help needed to walk in hospital room?: Total Help needed climbing 3-5 steps with a railing? : Total 6 Click Score: 7    End of Session Equipment Utilized During Treatment: Gait belt Activity Tolerance: Patient tolerated treatment well Patient left: in chair;with call bell/phone within reach Nurse Communication:  Mobility status;Need for lift equipment;Weight bearing status PT Visit Diagnosis: Unsteadiness on feet (R26.81);Muscle weakness (generalized) (M62.81);History of falling (Z91.81);Difficulty in walking, not elsewhere classified (R26.2);Pain Pain - Right/Left: Left Pain - part of body: Leg    Time: 1165-7903 PT Time Calculation (min) (ACUTE ONLY): 28 min   Charges:   PT Evaluation $PT Eval Moderate Complexity: 1 Mod          Deniece Ree PT, DPT, CBIS  Supplemental Physical Therapist [place]    Pager [phone] Acute Rehab Office [phone]

## 2019-08-03 NOTE — Progress Notes (Addendum)
Subjective: Patient reports pain in her abdomen. She points to the epigastric region, and endorses history of GERD. Denies hip pain. She agrees to work with therapist today. However she does not allow physical exam.  Consults: Ortho  Objective:  Vital signs in last 24 hours: Vitals:   08/02/19 1550 08/02/19 2039 08/03/19 0428 08/03/19 0500  BP: (!) 120/51 110/61 96/71 (!) 109/42  Pulse: 84 78 80   Resp: 16 14 13 18   Temp: 98.6 F (37 C) 99 F (37.2 C) 98.4 F (36.9 C)   TempSrc: Oral Oral Oral   SpO2: 94% 98% 99% 100%  Weight:      Height:       Physical Exam  Neurological: She is alert.  Skin: She is not diaphoretic.  Nursing note and vitals reviewed. Declined physical exam  Labs:  CMP Latest Ref Rng & Units 08/03/2019 08/02/2019 08/01/2019  Glucose 70 - 99 mg/dL 149(H) 134(H) 142(H)  BUN 8 - 23 mg/dL 27(H) 28(H) 26(H)  Creatinine 0.44 - 1.00 mg/dL 0.92 0.90 1.05(H)  Sodium 135 - 145 mmol/L 146(H) 148(H) 146(H)  Potassium 3.5 - 5.1 mmol/L 3.5 4.2 4.2  Chloride 98 - 111 mmol/L 116(H) 117(H) 116(H)  CO2 22 - 32 mmol/L 22 21(L) 19(L)  Calcium 8.9 - 10.3 mg/dL 8.7(L) 8.6(L) 8.8(L)  Total Protein 6.5 - 8.1 g/dL - - -  Total Bilirubin 0.3 - 1.2 mg/dL - - -  Alkaline Phos 38 - 126 U/L - - -  AST 15 - 41 U/L - - -  ALT 0 - 44 U/L - - -   CBC Latest Ref Rng & Units 08/03/2019 08/02/2019 08/01/2019  WBC 4.0 - 10.5 K/uL 10.8(H) 10.5 10.4  Hemoglobin 12.0 - 15.0 g/dL 11.0(L) 10.5(L) 12.6  Hematocrit 36.0 - 46.0 % 34.6(L) 33.6(L) 40.1  Platelets 150 - 400 K/uL 248 187 234    Assessment/Plan:  Assessment: Ms. Thrasher is our 85 yo F w/ a hx of dementia, HTN, CKD III, HFpEF, 3rd degree heart block s/p pacemaker placement who presented from her nursing home with a fever of 100.5 and AMS treated for presumed urosepsis whose continued fevers prompted imaging of her abdomen demonstrating a left femoral neck fx s/p Left hemi hip arthroplasty.   Plan:  Principal Problem:  Intertrochanteric fracture of left hip (HCC) -as part of her workup for fever/abdominal pain, a left femoral neck fx was incidentally found on CT. Son states she had a fall last month w/ out evidence of fx; no falls while in the hospital. Now s/p left hemi hip arthroplasty - patient denies hip pain today  -oxy IR PRN for pain control  -PT/OT ordered; patient declined yesterday; today patient acknowledges importance of therapy and nods in agreement when encouraged to work with therapists; poke with son about importance of PT and he noted pt does not walk at baseline just transfers from chair to wheelchair to bed. He spoke with her on the phone today and encourage her to do PT and she said OK.  -consult to social work to assist in setting up appropriate dc  Active Problems:   Abdominal pain:  -patient reports epigastric abdominal pain w/ sx of acid reflux  -starting 20 mg protonix BID    Dementia: -patient agitation somewhat improved on Risperdal 1.0 mg last night so will resume full home dose of 1.25 mg nightly   Fever/Altered mental status  -pt presented with fever of 100.5 at her nursing home, initial workup concerning for urosepsis  for which she received broad spectrum antibiotics narrowed to ceftriaxone alone. Blood cx NGTD and urine cx showed insignificant growth. Patient has endorsed abdominal pain, suprapubic in location originally thought to be related to a UTI, so CT abdomen ordered for additional fever workup which showed no evidence of an intra-abdominal infection. Given no clear source, antibiotics dc'ed. Patient afebrile. -WBC 10.8 today  -continue to trend fever curve -daily CBC -if she fevers again may consider repeat blood cx    Chronic HFpEF: -stable, grossly euvolemic but pt declined formal physical exam; on home metop but lower dose of 12.5; pt has had low to normal bps  Dispo: Anticipated discharge pending clinical course.  Al Decant, MD 08/03/2019, 8:34 AM Pager:  [phone]

## 2019-08-03 NOTE — Progress Notes (Signed)
Pt stood at bedside to sit on [facility]. She was able to void approximately 20 cc clear urine. Bladder scan post void showed only 107 ml/ Fluids encouraged.

## 2019-08-04 DIAGNOSIS — R339 Retention of urine, unspecified: Secondary | ICD-10-CM

## 2019-08-04 LAB — CBC
HCT: 34.5 % — ABNORMAL LOW (ref 36.0–46.0)
Hemoglobin: 11 g/dL — ABNORMAL LOW (ref 12.0–15.0)
MCH: 28.8 pg (ref 26.0–34.0)
MCHC: 31.9 g/dL (ref 30.0–36.0)
MCV: 90.3 fL (ref 80.0–100.0)
Platelets: 271 10*3/uL (ref 150–400)
RBC: 3.82 MIL/uL — ABNORMAL LOW (ref 3.87–5.11)
RDW: 15.3 % (ref 11.5–15.5)
WBC: 9 10*3/uL (ref 4.0–10.5)
nRBC: 0 % (ref 0.0–0.2)

## 2019-08-04 MED ORDER — METOPROLOL TARTRATE 25 MG PO TABS: 12.5000 mg | ORAL_TABLET | Freq: Two times a day (BID) | ORAL | 0 refills | Status: DC

## 2019-08-04 MED ORDER — PANTOPRAZOLE SODIUM 20 MG PO TBEC: 20.0000 mg | DELAYED_RELEASE_TABLET | Freq: Two times a day (BID) | ORAL | 0 refills | Status: AC

## 2019-08-04 MED ORDER — FUROSEMIDE 20 MG PO TABS: 40.0000 mg | ORAL_TABLET | Freq: Every day | ORAL | 0 refills | Status: DC

## 2019-08-04 NOTE — Progress Notes (Signed)
Unable to void NT- reports having to change her because of urine- performed a bladder scan- noted 44ml retained in bladder- obtained order for in/out cath 300 released- have attempted to get her to drink fluids - she was biting at the straw- stated she didn't want anything to drink

## 2019-08-04 NOTE — Progress Notes (Signed)
  Speech Language Pathology Treatment: Dysphagia  Patient Details Name: Victoria Lloyd MRN: [id number] DOB: 08-11-1922 Today's Date: 08/04/2019 Time: 4098-1191 SLP Time Calculation (min) (ACUTE ONLY): 13 min  Assessment / Plan / Recommendation Clinical Impression  Pt was encountered awake/alert and she was pleasantly confused and cooperative throughout this tx session.  Pt was observed with trials of thin liquid, regular solids, and softened solids (graham cracker softened in puree).  Pt independently took very small bites of regular solid given assistance and she continued to exhibit prolonged mastication with a munching mastication pattern and oral residue.  Oral residue was decreased during softened solid trial.  Pt cleared residue with cued liquid wash.  Pt was noted to be impulsive when consuming thin liquid, taking large serial sips from the straw; however, no clinical s/sx of aspiration were observed with any trials.  Pt reported that she enjoys her current diet and does not wish to change it at this time.  Suspect that pt is at or close to her baseline and that a Dysphagia 2 (fine chop) and thin liquid diet will continue to be more appropriate long term given cognitive-based impairments. No further skilled ST is warranted at this time.  Please re-consult if further needs arise.     HPI HPI: Toshika Parrow is a 85 year old female who presented from a memory care facility with fever, AMS, and hypotension.  PMH includes: HTN, CKD, CHF, and pacemaker.  Per son report, pt speaks little at baseline, but she is currently non-verbal.         SLP Plan  All goals met       Recommendations  Diet recommendations: Dysphagia 2 (fine chop);Thin liquid Liquids provided via: Cup;Straw Medication Administration: Crushed with puree Supervision: Full supervision/cueing for compensatory strategies Compensations: Slow rate;Small sips/bites;Follow solids with liquid Postural Changes and/or Swallow  Maneuvers: Seated upright 90 degrees                Oral Care Recommendations: Oral care BID Follow up Recommendations: Skilled Nursing facility SLP Visit Diagnosis: Dysphagia, unspecified (R13.10) Plan: All goals met       Bretta Bang, M.S., [place] Office: [phone]        [place] 08/04/2019, 3:51 PM

## 2019-08-04 NOTE — Progress Notes (Signed)
  Date: 08/04/2019  Patient name: Victoria Lloyd  Medical record number: [id number]  Date of birth: 1922-09-04        I have seen and evaluated this patient and I have discussed the plan of care with the house staff. Please see Dr. Webb Silversmith note for complete details. I concur with her findings and plan.  If patient requires chronic foley for urinary retention, will need to discuss with family regarding risk for UTI.  If SNF can offer bladder scans and I/O catheterization, this would be the preferred route.    Sid Falcon, MD 08/04/2019, 1:52 PM

## 2019-08-04 NOTE — Progress Notes (Signed)
Bladder scan yields urine >400cc. MD notified and order received to insert foley catheter. Writer had talked to patients son Victoria Lloyd and updated him on patients condition. He had questions about foley catheter and all questions were answered. Will cont to monitor.

## 2019-08-04 NOTE — TOC Progression Note (Signed)
Transition of Care [facility]) - Progression Note    Patient Details  Name: Victoria Lloyd MRN: [id number] Date of Birth: 13-May-1922  Transition of Care [facility]) CM/SW Contact  Sharlet Salina Mila Homer, LCSW Phone Number: 08/04/2019, 8:12 PM  Clinical Narrative:  Son initially requested that his mother return to [facility] due to her dementia, as he felt that she would do better in a familiar e environment. Patient has been having urinary retention and per MD would need a foley for urinary retention. Mr. Nacke contacted [facility] and asked CSW to talk with [place] ( who works in memory care regarding patient returning with a foley. CSW received a call from [place] and talked with her about the foley and was advised that she would need to check with the nurse, as they usually don't take foleys. After checking with the nurse, CSW rec'd call from [place] that patient will need to discharge to a SNF for rehab and foley care.     Expected Discharge Plan: Assisted Living([facility]) Barriers to Discharge: Continued Medical Work up  Expected Discharge Plan and Services Expected Discharge Plan: Assisted Living([facility]) In-house Referral: Clinical Social Work Discharge Planning Services: Other - See comment(HH, Equipement, PT/OT if needed) Post Acute Care Choice: Durable Medical Equipment, Home Health(Equipment and [facility] services if neded at [facility]) Living arrangements for the past 2 months: Assisted Living Facility(Patient a resident at [facility]) Expected Discharge Date: 08/04/19                                   Social Determinants of Health (SDOH) Interventions  No SDOH interventions needed or requested at this time.  Readmission Risk Interventions No flowsheet data found.

## 2019-08-04 NOTE — Progress Notes (Signed)
Subjective: Patient denies pain. She has some foamy saliva on her face and chest and she isn't aware if it is spit up or throw up. We cleaned her up and suctioned her mouth.   Consults: Ortho  Objective:  Vital signs in last 24 hours: Vitals:   08/03/19 1519 08/03/19 2001 08/04/19 0616 08/04/19 0811  BP: (!) 106/56 (!) 93/58 (!) 133/106 (!) 120/57  Pulse: 71 88 93 79  Resp: 17 16  16   Temp: 98.4 F (36.9 C) 97.9 F (36.6 C) (!) 97.5 F (36.4 C) 98.4 F (36.9 C)  TempSrc: Oral Oral Oral Oral  SpO2: 100% 100% 100% 100%  Weight:      Height:       Physical Exam  Constitutional: No distress.  Cardiovascular: Normal rate, regular rhythm and normal heart sounds.  No murmur heard. Pulmonary/Chest: Effort normal. No respiratory distress.  Abdominal: Soft. Bowel sounds are normal. She exhibits no distension.  Neurological: She is alert.  Skin: Skin is warm and dry. She is not diaphoretic. No erythema.  Nursing note and vitals reviewed.  Labs:  CMP Latest Ref Rng & Units 08/03/2019 08/02/2019 08/01/2019  Glucose 70 - 99 mg/dL 149(H) 134(H) 142(H)  BUN 8 - 23 mg/dL 27(H) 28(H) 26(H)  Creatinine 0.44 - 1.00 mg/dL 0.92 0.90 1.05(H)  Sodium 135 - 145 mmol/L 146(H) 148(H) 146(H)  Potassium 3.5 - 5.1 mmol/L 3.5 4.2 4.2  Chloride 98 - 111 mmol/L 116(H) 117(H) 116(H)  CO2 22 - 32 mmol/L 22 21(L) 19(L)  Calcium 8.9 - 10.3 mg/dL 8.7(L) 8.6(L) 8.8(L)  Total Protein 6.5 - 8.1 g/dL - - -  Total Bilirubin 0.3 - 1.2 mg/dL - - -  Alkaline Phos 38 - 126 U/L - - -  AST 15 - 41 U/L - - -  ALT 0 - 44 U/L - - -   CBC Latest Ref Rng & Units 08/04/2019 08/03/2019 08/02/2019  WBC 4.0 - 10.5 K/uL 9.0 10.8(H) 10.5  Hemoglobin 12.0 - 15.0 g/dL 11.0(L) 11.0(L) 10.5(L)  Hematocrit 36.0 - 46.0 % 34.5(L) 34.6(L) 33.6(L)  Platelets 150 - 400 K/uL 271 248 187    Assessment/Plan:  Assessment: Ms. Kegel is our 85 yo F w/ a hx of dementia, HTN, CKD III, HFpEF, 3rd degree heart block s/p pacemaker placement  who presented from her nursing home with a fever of 100.5 and AMS treated for presumed urosepsis whose continued fevers prompted imaging of her abdomen demonstrating a left femoral neck fx s/p Left hemi hip arthroplasty.   Plan:  Principal Problem:   Intertrochanteric fracture of left hip (HCC) -as part of her workup for fever/abdominal pain, a left femoral neck fx was incidentally found on CT. Son states she had a fall last month w/ out evidence of fx; no falls while in the hospital. Now s/p left hemi hip arthroplasty - patient denies hip pain today  -oxy IR PRN for pain control  -PT/OT recommend SNF -social work setting patient up for dc to SNF    Urinary retention: -post surgery she has had some trouble urinating on her on with in and out caths prn -will check to see if SNF can take patient w/ bladders scans or if she needs a chronic foley    Dementia: -on home risperdal 1.25 mg nightly   Fever/Altered mental status  -pt presented with fever of 100.5 at her nursing home, initial workup concerning for urosepsis for which she received broad spectrum antibiotics narrowed to ceftriaxone alone. Blood cx  NGTD and urine cx showed insignificant growth. Patient has endorsed abdominal pain, suprapubic in location originally thought to be related to a UTI, so CT abdomen ordered for additional fever workup which showed no evidence of an intra-abdominal infection. Given no clear source, antibiotics dc'ed. Patient afebrile. -WBC 9 today  -continue to trend fever curve -daily CBC -if she fevers again may consider repeat blood cx    Chronic HFpEF: -stable, grossly euvolemic but pt declined formal physical exam; on home metop but lower dose of 12.5; normal bps  Dispo: Anticipated discharge to SNF today  Al Decant, MD 08/04/2019, 10:22 AM Pager: [phone]

## 2019-08-04 NOTE — Discharge Instructions (Signed)
You were admitted for a fever and confusion. You were treated for a urinary tract infection with antibiotics. Due to concerns about your abdominal pain we got imaging of your abdomen which showed a hip fracture. You received surgery and therapy for that. You will be discharged back to the facility you came from with more therapy.     Dr. Rod Can Joint Replacement Specialist [facility] [address], [place], [place] [address] [phone]   TOTAL HIP REPLACEMENT POSTOPERATIVE DIRECTIONS    Hip Rehabilitation, Guidelines Following Surgery   WEIGHT BEARING Weight bearing as tolerated with assist device (walker, cane, etc) as directed, use it as long as suggested by your surgeon or therapist, typically at least 4-6 weeks.  The results of a hip operation are greatly improved after range of motion and muscle strengthening exercises. Follow all safety measures which are given to protect your hip. If any of these exercises cause increased pain or swelling in your joint, decrease the amount until you are comfortable again. Then slowly increase the exercises. Call your caregiver if you have problems or questions.   HOME CARE INSTRUCTIONS  Most of the following instructions are designed to prevent the dislocation of your new hip.  Remove items at home which could result in a fall. This includes throw rugs or furniture in walking pathways.  Continue medications as instructed at time of discharge.  You may have some home medications which will be placed on hold until you complete the course of blood thinner medication.  You may start showering once you are discharged home. Do not remove your dressing. Do not put on socks or shoes without following the instructions of your caregivers.   Sit on chairs with arms. Use the chair arms to help push yourself up when arising.  Arrange for the use of a toilet seat elevator so you are not sitting low.   Walk with walker as  instructed.  You may resume a sexual relationship in one month or when given the OK by your caregiver.  Use walker as long as suggested by your caregivers.  You may put full weight on your legs and walk as much as is comfortable. Avoid periods of inactivity such as sitting longer than an hour when not asleep. This helps prevent blood clots.  You may return to work once you are cleared by Engineer, production.  Do not drive a car for 6 weeks or until released by your surgeon.  Do not drive while taking narcotics.  Wear elastic stockings for two weeks following surgery during the day but you may remove then at night.  Make sure you keep all of your appointments after your operation with all of your doctors and caregivers. You should call the office at the above phone number and make an appointment for approximately two weeks after the date of your surgery. Please pick up a stool softener and laxative for home use as long as you are requiring pain medications.  ICE to the affected hip every three hours for 30 minutes at a time and then as needed for pain and swelling. Continue to use ice on the hip for pain and swelling from surgery. You may notice swelling that will progress down to the foot and ankle.  This is normal after surgery.  Elevate the leg when you are not up walking on it.   It is important for you to complete the blood thinner medication as prescribed by your doctor.  Continue to  use the breathing machine which will help keep your temperature down.  It is common for your temperature to cycle up and down following surgery, especially at night when you are not up moving around and exerting yourself.  The breathing machine keeps your lungs expanded and your temperature down.  RANGE OF MOTION AND STRENGTHENING EXERCISES  These exercises are designed to help you keep full movement of your hip joint. Follow your caregiver's or physical therapist's instructions. Perform all exercises about fifteen  times, three times per day or as directed. Exercise both hips, even if you have had only one joint replacement. These exercises can be done on a training (exercise) mat, on the floor, on a table or on a bed. Use whatever works the best and is most comfortable for you. Use music or television while you are exercising so that the exercises are a pleasant break in your day. This will make your life better with the exercises acting as a break in routine you can look forward to.  Lying on your back, slowly slide your foot toward your buttocks, raising your knee up off the floor. Then slowly slide your foot back down until your leg is straight again.  Lying on your back spread your legs as far apart as you can without causing discomfort.  Lying on your side, raise your upper leg and foot straight up from the floor as far as is comfortable. Slowly lower the leg and repeat.  Lying on your back, tighten up the muscle in the front of your thigh (quadriceps muscles). You can do this by keeping your leg straight and trying to raise your heel off the floor. This helps strengthen the largest muscle supporting your knee.  Lying on your back, tighten up the muscles of your buttocks both with the legs straight and with the knee bent at a comfortable angle while keeping your heel on the floor.   SKILLED REHAB INSTRUCTIONS: If the patient is transferred to a skilled rehab facility following release from the hospital, a list of the current medications will be sent to the facility for the patient to continue.  When discharged from the skilled rehab facility, please have the facility set up the patient's [place] prior to being released. Also, the skilled facility will be responsible for providing the patient with their medications at time of release from the facility to include their pain medication and their blood thinner medication. If the patient is still at the rehab facility at time of the two week  follow up appointment, the skilled rehab facility will also need to assist the patient in arranging follow up appointment in our office and any transportation needs.  MAKE SURE YOU:  Understand these instructions.  Will watch your condition.  Will get help right away if you are not doing well or get worse.  Pick up stool softner and laxative for home use following surgery while on pain medications. Do not remove your dressing. The dressing is waterproof--it is OK to take showers. Continue to use ice for pain and swelling after surgery. Do not use any lotions or creams on the incision until instructed by your surgeon. Total Hip Protocol.

## 2019-08-04 NOTE — Progress Notes (Signed)
Responding  to Spiritual Consult, checked Pt status with nurse who said Pt would probably not understand anything I said to her. At her bedside, after calling Pt name and getting no response, I offered prayer aloud.  Chaplain will follow up as needed.  De Burrs Chaplain Resident [phone]

## 2019-08-04 NOTE — Plan of Care (Signed)
  Problem: Education: Goal: Knowledge of General Education information will improve Description: Including pain rating scale, medication(s)/side effects and non-pharmacologic comfort measures Outcome: Progressing   Problem: Activity: Goal: Risk for activity intolerance will decrease Outcome: Progressing   Problem: Elimination: Goal: Will not experience complications related to urinary retention Outcome: Progressing   Problem: Safety: Goal: Ability to remain free from injury will improve Outcome: Progressing   Problem: Skin Integrity: Goal: Risk for impaired skin integrity will decrease Outcome: Progressing   

## 2019-08-04 NOTE — Discharge Summary (Addendum)
Name: Victoria Lloyd MRN: [id number] DOB: November 20, 1922 85 y.o. PCP: Patient, No Pcp Per  Date of Admission: 07/28/2019 10:15 AM Date of Discharge:  08/09/2019 Attending Physician: Sid Falcon, MD  Discharge Diagnosis:  1. Fever/AMS 2. Intertrochanteric fracture of left hip 3. Urinary retention  Discharge Medications: Allergies as of 08/05/2019      Reactions   Codeine Other (See Comments)   Unknown; patient cannot recall the reaction   Sulfa Antibiotics Other (See Comments)   Unknown; patient cannot recall the reaction   Lactose Intolerance (gi) Nausea And Vomiting   Milk-related Compounds Nausea And Vomiting   Other Other (See Comments)   "Seafood"- unknown reaction, listed on MAR.      Medication List    STOP taking these medications   traMADol 50 MG tablet Commonly known as: ULTRAM     TAKE these medications   acetaminophen 325 MG tablet Commonly known as: TYLENOL Take 1-2 tablets (325-650 mg total) by mouth every 4 (four) hours as needed for mild pain. What changed: how much to take   ALPRAZolam 0.25 MG tablet Commonly known as: XANAX Take 0.5 tablets (0.125 mg total) by mouth at bedtime. What changed:   how much to take  when to take this  reasons to take this   aspirin 81 MG tablet Take 81 mg by mouth at bedtime.   atorvastatin 10 MG tablet Commonly known as: LIPITOR Take 10 mg by mouth at bedtime.   calcium-vitamin D 500-200 MG-UNIT tablet Commonly known as: OSCAL WITH D Take 1 tablet by mouth at bedtime.   cholecalciferol 25 MCG (1000 UT) tablet Commonly known as: VITAMIN D Take 1,000 Units by mouth at bedtime.   cycloSPORINE 0.05 % ophthalmic emulsion Commonly known as: RESTASIS Place 1 drop into both eyes 2 (two) times daily as needed (dry eyes).   dextromethorphan-guaiFENesin 30-600 MG 12hr tablet Commonly known as: MUCINEX DM Take 1 tablet by mouth 2 (two) times daily.   diclofenac sodium 1 % Gel Commonly known as: VOLTAREN  Apply 2 g topically 4 (four) times daily.   enoxaparin 30 MG/0.3ML injection Commonly known as: LOVENOX Inject 0.3 mLs (30 mg total) into the skin daily.   Flanders Buttocks Oint Apply 1 application topically See admin instructions. With each diaper change   furosemide 20 MG tablet Commonly known as: Lasix Take 2 tablets (40 mg total) by mouth daily. Start taking on: August 11, 2019 What changed: These instructions start on August 11, 2019. If you are unsure what to do until then, ask your doctor or other care provider.   lidocaine 5 % Commonly known as: LIDODERM Place 1 patch onto the skin daily.   Melatonin 3 MG Tabs Take 3 mg by mouth at bedtime as needed (sleep).   metoprolol tartrate 25 MG tablet Commonly known as: LOPRESSOR Take 0.5 tablets (12.5 mg total) by mouth 2 (two) times daily. Titrate up as tolerated What changed:   how much to take  additional instructions   NUTRITIONAL SHAKE PO Take 1 each by mouth 2 (two) times daily between meals. Mighty Shake   ondansetron 4 MG disintegrating tablet Commonly known as: ZOFRAN-ODT Take 1 tablet (4 mg total) by mouth every 8 (eight) hours as needed for nausea or vomiting.   oxyCODONE 5 MG immediate release tablet Commonly known as: Oxy IR/ROXICODONE Take 1 tablet (5 mg total) by mouth every 6 (six) hours as needed for moderate pain or severe pain.   pantoprazole 20 MG tablet  Commonly known as: PROTONIX Take 1 tablet (20 mg total) by mouth 2 (two) times daily.   potassium chloride 10 MEQ tablet Commonly known as: K-DUR Take 1 tablet (10 mEq total) by mouth daily.   risperiDONE 1 MG/ML oral solution Commonly known as: RISPERDAL Take 1.25 mg by mouth at bedtime.   senna 8.6 MG Tabs tablet Commonly known as: SENOKOT Take 2 tablets (17.2 mg total) by mouth daily.       Disposition and follow-up:   Ms.Victoria Lloyd was discharged from [facility] in Stable condition.  At the hospital  follow up visit please address:   1.  Mobility s/p hip hemi arthroplasty; continued need for indwelling foley  2.  Labs / imaging needed at time of follow-up: none  3.  Pending labs/ test needing follow-up: none  Follow-up Appointments: Follow-up Information    Swinteck, Aaron Edelman, MD. Schedule an appointment as soon as possible for a visit in 2 weeks.   Specialty: Orthopedic Surgery Why: For wound re-check Contact information: [address] [place] [address] [phone]           Hospital Course by problem list:  1. Fever/AMS --pt presented with fever of 100.5 at her nursing home, initial workup concerning for urosepsis for which she received broad spectrum antibiotics narrowed to ceftriaxone alone. Blood cx NGTD and urine cx showed insignificant growth. Patient had endorsed abdominal pain, suprapubic in location originally thought to be related to a UTI, so CT abdomen ordered for additional fever workup which showed no evidence of an intra-abdominal infection. Given no clear source, antibiotics dc'ed. Patient afebrile without leukocytosis on discharge . 2. Intertrochanteric fracture of left hip -as part of her workup for fever/abdominal pain, a left femoral neck fx was incidentally found on CT. Son states she had a fall last month w/ out evidence of fx; no falls while in the hospital. Now s/p left hemi hip arthroplasty -patient evaluated by PT/OT who recommend SNF -patient w/ out pain on discharge  3. Urinary retention -post surgery she has had some trouble urinating on her on with in and out caths prn, likely secondary to her dementia and not understanding she needs to try to urinate -foley placed after nursing had discussion with son about pros and cons with plans for eventual removal while at SNF  Discharge Vitals:   BP (!) 115/51 (BP Location: Right Arm) Comment: nurse notified  Pulse 74   Temp 98.3 F (36.8 C) (Oral)   Resp 20   Ht 5\' 3"  (1.6 m)   Wt  51.5 kg   LMP  (LMP Unknown)   SpO2 100%   BMI 20.11 kg/m   Pertinent Labs, Studies, and Procedures:  CBC Latest Ref Rng & Units 08/04/2019 08/03/2019 08/02/2019  WBC 4.0 - 10.5 K/uL 9.0 10.8(H) 10.5  Hemoglobin 12.0 - 15.0 g/dL 11.0(L) 11.0(L) 10.5(L)  Hematocrit 36.0 - 46.0 % 34.5(L) 34.6(L) 33.6(L)  Platelets 150 - 400 K/uL 271 248 187   BMP Latest Ref Rng & Units 08/03/2019 08/02/2019 08/01/2019  Glucose 70 - 99 mg/dL 149(H) 134(H) 142(H)  BUN 8 - 23 mg/dL 27(H) 28(H) 26(H)  Creatinine 0.44 - 1.00 mg/dL 0.92 0.90 1.05(H)  Sodium 135 - 145 mmol/L 146(H) 148(H) 146(H)  Potassium 3.5 - 5.1 mmol/L 3.5 4.2 4.2  Chloride 98 - 111 mmol/L 116(H) 117(H) 116(H)  CO2 22 - 32 mmol/L 22 21(L) 19(L)  Calcium 8.9 - 10.3 mg/dL 8.7(L) 8.6(L) 8.8(L)   CT Abdomen  and Pelvis w/ Contrast:  1. Inflammatory changes of the stomach may represent gastritis. Gastric ulcer is not entirely excluded. 2. A 17 x 31 mm contrast attenuation anterior to the right renal vein is not well characterized but may represent oral contrast within the duodenum. A bleeding ulcer is not excluded. Evaluation of the bowel is limited due to respiratory motion artifact. 3. Extensive sigmoid diverticulosis. No bowel obstruction. 4. Displaced fracture of the left femoral neck with mild proximal migration of the femoral shaft. 5. Aortic Atherosclerosis (ICD10-I70.0). 6. Small bilateral pleural effusions with associated bibasilar compressive atelectasis.  DG HIP PORT UNILAT WITH PELVIS 1V LEFT: Acute LEFT femoral neck fracture  DG Knee Left Port: 1. No evidence for acute abnormality. 2. Hardware intact.  DG C-Arm 1-60 Min: Satisfactory postoperative appearance of a left hip prosthesis.  DG HIP OPERATIVE UNILAT W OR W/O PELVIS LEFT: Satisfactory postoperative appearance of a left hip prosthesis.  Pelvis Portable: Interval left hip hemiarthroplasty without evidence of immediate postoperative complication.  Discharge  Instructions: Discharge Instructions    Diet - low sodium heart healthy   Complete by: As directed    Increase activity slowly   Complete by: As directed        Discharge Instructions     You were admitted for a fever and confusion. You were treated for a urinary tract infection with antibiotics. Due to concerns about your abdominal pain we got imaging of your abdomen which showed a hip fracture. You received surgery and therapy for that. You will be discharged back to the facility you came from with more therapy.     Dr. Rod Can Joint Replacement Specialist [facility] [address], [place], [place] [address] [phone]   TOTAL HIP REPLACEMENT POSTOPERATIVE DIRECTIONS    Hip Rehabilitation, Guidelines Following Surgery   WEIGHT BEARING Weight bearing as tolerated with assist device (walker, cane, etc) as directed, use it as long as suggested by your surgeon or therapist, typically at least 4-6 weeks.  The results of a hip operation are greatly improved after range of motion and muscle strengthening exercises. Follow all safety measures which are given to protect your hip. If any of these exercises cause increased pain or swelling in your joint, decrease the amount until you are comfortable again. Then slowly increase the exercises. Call your caregiver if you have problems or questions.   HOME CARE INSTRUCTIONS  Most of the following instructions are designed to prevent the dislocation of your new hip.  Remove items at home which could result in a fall. This includes throw rugs or furniture in walking pathways.  Continue medications as instructed at time of discharge.  You may have some home medications which will be placed on hold until you complete the course of blood thinner medication.  You may start showering once you are discharged home. Do not remove your dressing. Do not put on socks or shoes without following the instructions of  your caregivers.   Sit on chairs with arms. Use the chair arms to help push yourself up when arising.  Arrange for the use of a toilet seat elevator so you are not sitting low.   Walk with walker as instructed.  You may resume a sexual relationship in one month or when given the OK by your caregiver.  Use walker as long as suggested by your caregivers.  You may put full weight on your legs and walk as much as is comfortable. Avoid periods of inactivity such  as sitting longer than an hour when not asleep. This helps prevent blood clots.  You may return to work once you are cleared by Engineer, production.  Do not drive a car for 6 weeks or until released by your surgeon.  Do not drive while taking narcotics.  Wear elastic stockings for two weeks following surgery during the day but you may remove then at night.  Make sure you keep all of your appointments after your operation with all of your doctors and caregivers. You should call the office at the above phone number and make an appointment for approximately two weeks after the date of your surgery. Please pick up a stool softener and laxative for home use as long as you are requiring pain medications.  ICE to the affected hip every three hours for 30 minutes at a time and then as needed for pain and swelling. Continue to use ice on the hip for pain and swelling from surgery. You may notice swelling that will progress down to the foot and ankle.  This is normal after surgery.  Elevate the leg when you are not up walking on it.   It is important for you to complete the blood thinner medication as prescribed by your doctor.  Continue to use the breathing machine which will help keep your temperature down.  It is common for your temperature to cycle up and down following surgery, especially at night when you are not up moving around and exerting yourself.  The breathing machine keeps your lungs expanded and your temperature down.  RANGE OF MOTION AND  STRENGTHENING EXERCISES  These exercises are designed to help you keep full movement of your hip joint. Follow your caregiver's or physical therapist's instructions. Perform all exercises about fifteen times, three times per day or as directed. Exercise both hips, even if you have had only one joint replacement. These exercises can be done on a training (exercise) mat, on the floor, on a table or on a bed. Use whatever works the best and is most comfortable for you. Use music or television while you are exercising so that the exercises are a pleasant break in your day. This will make your life better with the exercises acting as a break in routine you can look forward to.  Lying on your back, slowly slide your foot toward your buttocks, raising your knee up off the floor. Then slowly slide your foot back down until your leg is straight again.  Lying on your back spread your legs as far apart as you can without causing discomfort.  Lying on your side, raise your upper leg and foot straight up from the floor as far as is comfortable. Slowly lower the leg and repeat.  Lying on your back, tighten up the muscle in the front of your thigh (quadriceps muscles). You can do this by keeping your leg straight and trying to raise your heel off the floor. This helps strengthen the largest muscle supporting your knee.  Lying on your back, tighten up the muscles of your buttocks both with the legs straight and with the knee bent at a comfortable angle while keeping your heel on the floor.   SKILLED REHAB INSTRUCTIONS: If the patient is transferred to a skilled rehab facility following release from the hospital, a list of the current medications will be sent to the facility for the patient to continue.  When discharged from the skilled rehab facility, please have the facility set up the patient's Home  Health Physical Therapy prior to being released. Also, the skilled facility will be responsible for providing the patient  with their medications at time of release from the facility to include their pain medication and their blood thinner medication. If the patient is still at the rehab facility at time of the two week follow up appointment, the skilled rehab facility will also need to assist the patient in arranging follow up appointment in our office and any transportation needs.  MAKE SURE YOU:  Understand these instructions.  Will watch your condition.  Will get help right away if you are not doing well or get worse.  Pick up stool softner and laxative for home use following surgery while on pain medications. Do not remove your dressing. The dressing is waterproof--it is OK to take showers. Continue to use ice for pain and swelling after surgery. Do not use any lotions or creams on the incision until instructed by your surgeon. Total Hip Protocol.      Signed: Al Decant, MD 08/05/2019, 7:32 AM   Pager: [phone]

## 2019-08-05 MED ORDER — DOCUSATE SODIUM 50 MG/5ML PO LIQD
100.0000 mg | Freq: Two times a day (BID) | ORAL | Status: DC
  Administered 2019-08-05 – 2019-08-09 (×9): 100 mg via ORAL
  Filled 2019-08-05 (×9): qty 10

## 2019-08-05 NOTE — NC FL2 (Signed)
[place] MEDICAID FL2 LEVEL OF CARE SCREENING TOOL     IDENTIFICATION  Patient Name: Victoria Lloyd Birthdate: December 06, 1921 Sex: female Admission Date (Current Location): 07/28/2019  [facility] and [place] Number:  Herbalist and Address:  [facility], [place] [address] [place], [place] [address]      Provider Number: [id number]  Attending Physician Name and Address:  Sid Falcon, MD  Relative Name and Phone Number:  Jene Every, [phone]    Current Level of Care: Hospital Recommended Level of Care: [place] Prior Approval Number:    Date Approved/Denied: 04/25/16 PASRR Number: [id number]  Discharge Plan: SNF    Current Diagnoses: Patient Active Problem List   Diagnosis Date Noted  . Intertrochanteric fracture of left hip ([place]) 07/31/2019  . Dementia ([place]) 07/31/2019  . Altered mental status 07/28/2019  . Acute encephalopathy 08/19/2018  . Fall at home, initial encounter 02/02/2018  . Fever of unknown origin (FUO) 02/01/2018  . UTI (lower urinary tract infection)   . Anxiety 06/03/2016  . SOB (shortness of breath) 06/03/2016  . Elevated troponin 06/03/2016  . CHF exacerbation ([place]) 06/03/2016  . Acute on chronic diastolic (congestive) heart failure ([place])   . Syncope 04/24/2016  . Pacemaker- [employer] Jude impalnted 08/04/14 08/06/2014  . AV block, 3rd degree (HCC) 08/03/2014  . Acute CHF- secondary to bradycardia on admision (Nl LVF by echo) 08/03/2014  . CKD (chronic kidney disease) stage 4, GFR 15-29 ml/min (HCC) 08/03/2014  . Bradycardia 08/03/2014  . Hypertension 03/15/2011    Orientation RESPIRATION BLADDER Height & Weight     Self  Normal Incontinent, Indwelling catheter Weight: 113 lb 8.6 oz (51.5 kg) Height:  5\' 3"  (160 cm)  BEHAVIORAL SYMPTOMS/MOOD NEUROLOGICAL BOWEL NUTRITION STATUS      Continent Diet(Low sodium/heart healthy, thin liquids)  AMBULATORY STATUS COMMUNICATION OF NEEDS Skin    Extensive Assist Verbally Surgical wounds(Left hip closed incision (dressing in place) and left chest incision (dressing in place))                       Personal Care Assistance Level of Assistance  Bathing, Feeding, Dressing, Total care Bathing Assistance: Maximum assistance Feeding assistance: Limited assistance Dressing Assistance: Maximum assistance Total Care Assistance: Maximum assistance   Functional Limitations Info  Sight, Hearing, Speech Sight Info: Adequate Hearing Info: Adequate Speech Info: Adequate    SPECIAL CARE FACTORS FREQUENCY  PT (By licensed PT), OT (By licensed OT)     PT Frequency: 5x/wk OT Frequency: 5x/wk            Contractures Contractures Info: Not present    Additional Factors Info  Code Status, Allergies, Psychotropic Code Status Info: Full Code Allergies Info: Codeine, Sulfa Antibiotics, Lactose Intolerance (Gi), Milk-related Compounds, Other Psychotropic Info: risperdal 1.3mg /ml daily at bedtime         Current Medications (08/05/2019):  This is the current hospital active medication list Current Facility-Administered Medications  Medication Dose Route Frequency Provider Last Rate Last Dose  . aspirin EC tablet 81 mg  81 mg Oral QHS Rod Can, MD   81 mg at 08/04/19 2224  . cycloSPORINE (RESTASIS) 0.05 % ophthalmic emulsion 1 drop  1 drop Both Eyes BID PRN Swinteck, Aaron Edelman, MD      . docusate (COLACE) 50 MG/5ML liquid 100 mg  100 mg Oral BID Gilles Chiquito B, MD   100 mg at 08/05/19 1016  . enoxaparin (LOVENOX) injection 30  mg  30 mg Subcutaneous Q24H Rod Can, MD   30 mg at 08/05/19 0957  . Gerhardt's butt cream   Topical TID PRN Swinteck, Aaron Edelman, MD      . menthol-cetylpyridinium (CEPACOL) lozenge 3 mg  1 lozenge Oral PRN Swinteck, Aaron Edelman, MD       Or  . phenol (CHLORASEPTIC) mouth spray 1 spray  1 spray Mouth/Throat PRN Swinteck, Aaron Edelman, MD      . metoCLOPramide (REGLAN) tablet 5-10 mg  5-10 mg Oral Q8H PRN Swinteck,  Aaron Edelman, MD       Or  . metoCLOPramide (REGLAN) injection 5-10 mg  5-10 mg Intravenous Q8H PRN Swinteck, Aaron Edelman, MD      . metoprolol tartrate (LOPRESSOR) tablet 12.5 mg  12.5 mg Oral BID Rod Can, MD   12.5 mg at 08/05/19 1002  . mupirocin ointment (BACTROBAN) 2 % 1 application  1 application Nasal BID Rod Can, MD   1 application at [id number]  . ondansetron (ZOFRAN) tablet 4 mg  4 mg Oral Q6H PRN Swinteck, Aaron Edelman, MD       Or  . ondansetron (ZOFRAN) injection 4 mg  4 mg Intravenous Q6H PRN Swinteck, Aaron Edelman, MD      . oxyCODONE (Oxy IR/ROXICODONE) immediate release tablet 5 mg  5 mg Oral Q6H PRN Rod Can, MD   5 mg at 08/04/19 0644  . pantoprazole (PROTONIX) EC tablet 20 mg  20 mg Oral BID Al Decant, MD   20 mg at 08/05/19 0959  . risperiDONE (RISPERDAL) 1 MG/ML oral solution 1.3 mg  1.3 mg Oral QHS Al Decant, MD   1.3 mg at 08/04/19 2225  . senna-docusate (Senokot-S) tablet 1 tablet  1 tablet Oral QHS PRN Rod Can, MD         Discharge Medications: Please see discharge summary for a list of discharge medications.  Relevant Imaging Results:  Relevant Lab Results:   Additional Information SSN: [id number]  [place], LCSWA

## 2019-08-05 NOTE — Plan of Care (Signed)
  Problem: Urinary Elimination: Goal: Signs and symptoms of infection will decrease Outcome: Progressing   Problem: Clinical Measurements: Goal: Diagnostic test results will improve Outcome: Progressing Goal: Signs and symptoms of infection will decrease Outcome: Progressing   Problem: Education: Goal: Knowledge of General Education information will improve Description: Including pain rating scale, medication(s)/side effects and non-pharmacologic comfort measures Outcome: Progressing   Problem: Health Behavior/Discharge Planning: Goal: Ability to manage health-related needs will improve Outcome: Progressing   Problem: Clinical Measurements: Goal: Ability to maintain clinical measurements within normal limits will improve Outcome: Progressing Goal: Will remain free from infection Outcome: Progressing   Problem: Activity: Goal: Risk for activity intolerance will decrease Outcome: Progressing   Problem: Nutrition: Goal: Adequate nutrition will be maintained Outcome: Progressing   Problem: Elimination: Goal: Will not experience complications related to bowel motility Outcome: Progressing Goal: Will not experience complications related to urinary retention Outcome: Progressing   Problem: Pain Managment: Goal: General experience of comfort will improve Outcome: Progressing   Problem: Safety: Goal: Ability to remain free from injury will improve Outcome: Progressing   Problem: Skin Integrity: Goal: Risk for impaired skin integrity will decrease Outcome: Progressing

## 2019-08-05 NOTE — Progress Notes (Signed)
  Date: 08/05/2019  Patient name: Victoria Lloyd  Medical record number: [id number]  Date of birth: 1922-04-03        I have seen and evaluated this patient and I have discussed the plan of care with the house staff. Please see Dr. Webb Silversmith note for complete details. I concur with her findings and plan for discharge today.   Sid Falcon, MD 08/05/2019, 9:48 AM

## 2019-08-05 NOTE — Progress Notes (Signed)
   Subjective: Patient asleep in bed this morning on rounds but awoke to questioning. She denies pain in abdomen and legs.   Consults: Ortho  Objective:  Vital signs in last 24 hours: Vitals:   08/04/19 0811 08/04/19 1300 08/04/19 2201 08/05/19 0522  BP: (!) 120/57 (!) 130/57 (!) 112/31 (!) 115/51  Pulse: 79 63 85 74  Resp: 16 16 20    Temp: 98.4 F (36.9 C) 98.5 F (36.9 C) 97.9 F (36.6 C) 98.3 F (36.8 C)  TempSrc: Oral Oral Axillary Oral  SpO2: 100% 100% 98% 100%  Weight:      Height:       Physical Exam  Constitutional: No distress.  Cardiovascular: Normal rate, regular rhythm and normal heart sounds.  No murmur heard. Pulmonary/Chest: Effort normal. No respiratory distress.  Abdominal: Soft. Bowel sounds are normal. She exhibits no distension.  Neurological: She is alert.  Follows commands  Skin: Skin is warm and dry. She is not diaphoretic. No erythema.  Nursing note and vitals reviewed.  Assessment/Plan:  Assessment: Ms. Stockhausen is our 85 yo F w/ a hx of dementia, HTN, CKD III, HFpEF, 3rd degree heart block s/p pacemaker placement who presented from her nursing home with a fever of 100.5 and AMS treated for presumed urosepsis whose continued fevers prompted imaging of her abdomen demonstrating a left femoral neck fx s/p Left hemi hip arthroplasty.   Plan:  Principal Problem:   Intertrochanteric fracture of left hip (HCC) -as part of her workup for fever/abdominal pain, a left femoral neck fx was incidentally found on CT. Son states she had a fall last month w/ out evidence of fx; no falls while in the hospital. Now s/p left hemi hip arthroplasty - patient denies hip pain today  -oxy IR PRN for pain control  -PT/OT recommend SNF -social work setting patient up for dc to SNF    Urinary retention: -post surgery she has had some trouble urinating on her on with in and out caths prn -foley placed after discussion with son     Dementia: -on home risperdal 1.25 mg  nightly   Fever/Altered mental status (resolved) -pt presented with fever of 100.5 at her nursing home, initial workup concerning for urosepsis for which she received broad spectrum antibiotics narrowed to ceftriaxone alone. Blood cx NGTD and urine cx showed insignificant growth. Patient had endorsed abdominal pain, suprapubic in location originally thought to be related to a UTI, so CT abdomen ordered for additional fever workup which showed no evidence of an intra-abdominal infection. Given no clear source, antibiotics dc'ed. Patient afebrile.    Chronic HFpEF: -stable, euvolemic; on home metop but lower dose of 12.5; normal bps  Dispo: Anticipated discharge to SNF today  Al Decant, MD 08/05/2019, 6:02 AM Pager: [phone]

## 2019-08-05 NOTE — Progress Notes (Signed)
Physical Therapy Treatment Patient Details Name: Victoria Lloyd MRN: [id number] DOB: 1922-05-08 Today's Date: 08/05/2019    History of Present Illness 85yo female presenting to the hospital with sepsis due to UTI and abdominal pain. CT + for L femoral neck fracture. She received L THR on 08/01/19. PMH CHF, CKD stage 4, complete heart block s/p PPM, HTN, CVA, severe dementia    PT Comments    Pt remains limited due to dementia.  Foley still in place and she continues to require +2 total assistance for all mobility.  She did weight bear on B LEs briefly.  Pt resting in chair comfortably.  Continue to recommend SNF placement at this time.     Follow Up Recommendations  SNF     Equipment Recommendations  None recommended by PT(defer to next venue)    Recommendations for Other Services       Precautions / Restrictions Precautions Precautions: Fall Precaution Booklet Issued: No Restrictions Weight Bearing Restrictions: Yes LLE Weight Bearing: Weight bearing as tolerated    Mobility  Bed Mobility Overal bed mobility: Needs Assistance Bed Mobility: Supine to Sit     Supine to sit: Total assist;+2 for physical assistance     General bed mobility comments: totalA to pivot to EOB, patient mostly agreeable but did intermittentlly call out due to pain  Transfers Overall transfer level: Needs assistance Equipment used: Ambulation equipment used(sara stedy) Transfers: Sit to/from Stand Sit to Stand: Total assist;+2 physical assistance         General transfer comment: Pt able to hold to sara stedy cross bar but required +2 total assistance to move into standing to place stedy plates.  Pt presents with flexed posture and hips and upper trunk.  Ambulation/Gait                 Stairs             Wheelchair Mobility    Modified Rankin (Stroke Patients Only)       Balance Overall balance assessment: Needs assistance   Sitting balance-Leahy Scale: Poor        Standing balance-Leahy Scale: Zero                              Cognition Arousal/Alertness: Awake/alert Behavior During Therapy: Flat affect Overall Cognitive Status: History of cognitive impairments - at baseline                                 General Comments: severe dementia at baseline      Exercises      General Comments        Pertinent Vitals/Pain Pain Assessment: Faces Pain Score: 6  Pain Location: left leg with movement Pain Descriptors / Indicators: Discomfort;Guarding Pain Intervention(s): Monitored during session;Repositioned    Home Living                      Prior Function            PT Goals (current goals can now be found in the care plan section) Acute Rehab PT Goals PT Goal Formulation: Patient unable to participate in goal setting Progress towards PT goals: Progressing toward goals    Frequency    Min 2X/week      PT Plan Current plan remains appropriate    Co-evaluation  AM-PAC PT "6 Clicks" Mobility   Outcome Measure  Help needed turning from your back to your side while in a flat bed without using bedrails?: Total Help needed moving from lying on your back to sitting on the side of a flat bed without using bedrails?: Total Help needed moving to and from a bed to a chair (including a wheelchair)?: Total Help needed standing up from a chair using your arms (e.g., wheelchair or bedside chair)?: Total Help needed to walk in hospital room?: Total Help needed climbing 3-5 steps with a railing? : Total 6 Click Score: 6    End of Session Equipment Utilized During Treatment: Gait belt Activity Tolerance: Patient tolerated treatment well Patient left: in chair;with call bell/phone within reach Nurse Communication: Mobility status;Need for lift equipment;Weight bearing status PT Visit Diagnosis: Unsteadiness on feet (R26.81);Muscle weakness (generalized) (M62.81);History of  falling (Z91.81);Difficulty in walking, not elsewhere classified (R26.2);Pain Pain - Right/Left: Left     Time: [id number] PT Time Calculation (min) (ACUTE ONLY): 20 min  Charges:  $Therapeutic Activity: 8-22 mins                     Governor Rooks, PTA Acute Rehabilitation Services Pager [phone] Office [phone]     Herndon Grill Eli Hose 08/05/2019, 3:44 PM

## 2019-08-05 NOTE — TOC Progression Note (Signed)
Transition of Care [facility]) - Progression Note    Patient Details  Name: Victoria Lloyd MRN: [id number] Date of Birth: March 17, 1922  [facility]) CM/SW [place], [place] Phone Number: 08/05/2019, 12:06 PM  Clinical Narrative:     CSW completed the patient's Fl2 and faxed the patient out to SNF's in [facility]. CSW will contact the patient's son once bed offers come.   The patient will need insurance authorization. Patient is medically ready for discharge. Plan for discharge once the patient has a SNF bed and insurance authorization.   CSW will continue to follow and assist with discharge.   Expected Discharge Plan: Skilled Nursing Facility Barriers to Discharge: SNF Pending bed offer, Insurance Authorization  Expected Discharge Plan and Services Expected Discharge Plan: [place] In-house Referral: Clinical Social Work Discharge Planning Services: Other - See comment(HH, Equipement, PT/OT if needed) Post Acute Care Choice: [place] arrangements for the past 2 months: [place] Expected Discharge Date: 08/05/19                                     Social Determinants of Health (SDOH) Interventions    Readmission Risk Interventions No flowsheet data found.

## 2019-08-06 LAB — BASIC METABOLIC PANEL
Anion gap: 8 (ref 5–15)
BUN: 14 mg/dL (ref 8–23)
CO2: 21 mmol/L — ABNORMAL LOW (ref 22–32)
Calcium: 8.4 mg/dL — ABNORMAL LOW (ref 8.9–10.3)
Chloride: 120 mmol/L — ABNORMAL HIGH (ref 98–111)
Creatinine, Ser: 0.79 mg/dL (ref 0.44–1.00)
GFR calc Af Amer: >60 mL/min (ref >60)
GFR calc non Af Amer: >60 mL/min (ref >60)
Glucose, Bld: 164 mg/dL — ABNORMAL HIGH (ref 70–99)
Potassium: 3.5 mmol/L (ref 3.5–5.1)
Sodium: 149 mmol/L — ABNORMAL HIGH (ref 135–145)

## 2019-08-06 NOTE — Plan of Care (Signed)
  Problem: Pain Managment: Goal: General experience of comfort will improve Outcome: Progressing   

## 2019-08-06 NOTE — Progress Notes (Signed)
   Subjective: Patient awake watching tv in bed on rounds this morning. She says she feels well and denies pain.  Consults: Ortho  Objective:  Vital signs in last 24 hours: Vitals:   08/05/19 0804 08/05/19 1000 08/05/19 2007 08/06/19 0401  BP: (!) 117/47 110/60 110/60 (!) 114/94  Pulse: 75 94 86 93  Resp: 16  18 18   Temp: 98.1 F (36.7 C)  98.2 F (36.8 C) 98.7 F (37.1 C)  TempSrc:    Axillary  SpO2: 97%  97% 99%  Weight:      Height:       Physical Exam  Constitutional: No distress.  Cardiovascular: Normal rate, regular rhythm and normal heart sounds.  No murmur heard. Pulmonary/Chest:  Declined respiratory exam  Abdominal: Soft. Bowel sounds are normal. She exhibits no distension.  Neurological: She is alert.  Follows commands  Skin: Skin is warm and dry. She is not diaphoretic. No erythema.  Nursing note and vitals reviewed.  Assessment/Plan:  Assessment: Ms. Gauthier is our 85 yo F w/ a hx of dementia, HTN, CKD III, HFpEF, 3rd degree heart block s/p pacemaker placement who presented from her nursing home with a fever of 100.5 and AMS treated for presumed urosepsis whose continued fevers prompted imaging of her abdomen demonstrating a left femoral neck fx s/p Left hemi hip arthroplasty awaiting SNF placement.   Plan:  Principal Problem:   Intertrochanteric fracture of left hip (HCC) -as part of her workup for fever/abdominal pain, a left femoral neck fx was incidentally found on CT. Son states she had a fall last month w/ out evidence of fx; no falls while in the hospital. Now s/p left hemi hip arthroplasty - patient denies hip pain today  -oxy IR PRN for pain control  -PT/OT recommend SNF -patiently medically ready for discharge and awaiting SNF placement    Urinary retention: -post surgery she has had some trouble urinating on her on; in and out caths prn -foley placed temporarily, now reattempting bladder scans w/ in and out caths as needed    Dementia: -on  home risperdal 1.25 mg nightly    Chronic HFpEF: -stable, euvolemic; on home metop but lower dose of 12.5; normal bps  Dispo: Medically ready for discharge and awaiting SNF placement   Al Decant, MD 08/06/2019, 12:00 PM Pager: [phone]

## 2019-08-06 NOTE — Plan of Care (Signed)
  Problem: Urinary Elimination: Goal: Signs and symptoms of infection will decrease Outcome: Progressing   

## 2019-08-06 NOTE — Progress Notes (Signed)
Foley d/ced per orders

## 2019-08-06 NOTE — Progress Notes (Signed)
  Date: 08/06/2019  Patient name: Victoria Lloyd  Medical record number: [id number]  Date of birth: Mar 27, 1922        I have seen and evaluated this patient and I have discussed the plan of care with the house staff. Please see Dr. Webb Silversmith note for complete details. I concur with her findings and plan.   Sid Falcon, MD 08/06/2019, 12:07 PM

## 2019-08-07 NOTE — Progress Notes (Signed)
   Subjective: This patient reports that she was having some pain today.  She denied any other issues.  Objective:  Vital signs in last 24 hours: Vitals:   08/06/19 0401 08/06/19 1413 08/06/19 1930 08/07/19 0358  BP: (!) 114/94 (!) 116/47 134/77 121/69  Pulse: 93 77 79 76  Resp: 18 16 17 16   Temp: 98.7 F (37.1 C) 98.5 F (36.9 C) 98.4 F (36.9 C) 98.2 F (36.8 C)  TempSrc: Axillary Oral Oral Oral  SpO2: 99% 99% 98% 96%  Weight:      Height:        General: Elderly female, distress, laying in bed Cardiac: RRR, systolic murmur Pulmonary: CTABL, normal work of breathing Abdomen: Soft, non-tender, non-distended Extremity: no LE edema  Assessment/Plan:  Principal Problem:   Intertrochanteric fracture of left hip (HCC) Active Problems:   Fever of unknown origin (FUO)   Altered mental status   Dementia (HCC)  This is a 85 year old female with history of dementia, hypertension, CKD stage III, HFpEF, 3rd degree heart block status post pacemaker who initially presented with a fever and altered mental status, noted to have a left femoral neck fracture on imaging.  Status post left hip arthroplasty on 08/01/19.  Awaiting SNF placement.  Intertrochanteric fracture of left hip: -Status post left hip arthroplasty.  PT/OT recommends SNF on discharge.  Patient is reporting some pain today. Has OxyIR for pain control. -PT/OT recommended SNF, appreciate social work assistance with this -OxyIR for pain control  Urinary retention: -Has been having trouble urinating following her procedure, attempting bladder scans with in and out caths.  HFpEF: Stable, euvolemic on exam.  Continue metoprolol  Dispo: Patient is medically ready for discharge, awaiting SNF/ALF placement.   Asencion Noble, MD 08/07/2019, 6:35 AM Pager: [phone]

## 2019-08-07 NOTE — Progress Notes (Signed)
  Date: 08/07/2019  Patient name: Victoria Lloyd  Medical record number: [id number]  Date of birth: Jun 21, 1922   This patient's plan of care was discussed with the house staff. Please see Dr. Dorothyann Peng note for complete details. I concur with her findings.   Sid Falcon, MD 08/07/2019, 7:30 PM

## 2019-08-07 NOTE — Plan of Care (Signed)
  Problem: Urinary Elimination: Goal: Signs and symptoms of infection will decrease Outcome: Progressing   Problem: Activity: Goal: Risk for activity intolerance will decrease Outcome: Progressing   Problem: Pain Managment: Goal: General experience of comfort will improve Outcome: Progressing   Problem: Safety: Goal: Ability to remain free from injury will improve Outcome: Progressing   Problem: Skin Integrity: Goal: Risk for impaired skin integrity will decrease Outcome: Progressing

## 2019-08-07 NOTE — Progress Notes (Signed)
No bladder scanner available to complete every 6 hours bladder scan order.   Nursing staff have searched our unit and neighboring units with no success of locating bladder scanner.  Nursing will continue to monitor.

## 2019-08-07 NOTE — Plan of Care (Signed)
  Problem: Safety: Goal: Ability to remain free from injury will improve Outcome: Progressing   

## 2019-08-08 DIAGNOSIS — Z960 Presence of urogenital implants: Secondary | ICD-10-CM

## 2019-08-08 LAB — CREATININE, SERUM
Creatinine, Ser: 0.8 mg/dL (ref 0.44–1.00)
GFR calc Af Amer: >60 mL/min (ref >60)
GFR calc non Af Amer: >60 mL/min (ref >60)

## 2019-08-08 LAB — SARS CORONAVIRUS 2 (TAT 6-24 HRS): SARS Coronavirus 2: NEGATIVE

## 2019-08-08 NOTE — Progress Notes (Addendum)
Physical Therapy Treatment Patient Details Name: Victoria Lloyd MRN: [id number] DOB: 1922-04-18 Today's Date: 08/08/2019    History of Present Illness 85yo female presenting to the hospital with sepsis due to UTI and abdominal pain. CT + for L femoral neck fracture. She received L THR on 08/01/19. PMH CHF, CKD stage 4, complete heart block s/p PPM, HTN, CVA, severe dementia    PT Comments    Patient seen for mobility progression. Pt requires total A +2 for all mobility and Maximove hoyer lift required for safe transfer recliner to bed. Continue to progress as tolerated.      Follow Up Recommendations  SNF     Equipment Recommendations  None recommended by PT(defer to next venue)    Recommendations for Other Services  Palliative Care consult     Precautions / Restrictions Precautions Precautions: Fall Restrictions Weight Bearing Restrictions: Yes LLE Weight Bearing: Weight bearing as tolerated    Mobility  Bed Mobility Overal bed mobility: Needs Assistance             General bed mobility comments: total A +2 for positioning in bed  Transfers Overall transfer level: Needs assistance               General transfer comment: attempted transfer recliner to EOB X 3 however unable as pt resistive to mobility and attempting to bite therapist's hand; use of maximove hoyer lift for return to bed  Ambulation/Gait                 Stairs             Wheelchair Mobility    Modified Rankin (Stroke Patients Only)       Balance Overall balance assessment: Needs assistance Sitting-balance support: Bilateral upper extremity supported;Feet supported Sitting balance-Leahy Scale: Poor       Standing balance-Leahy Scale: Zero                              Cognition Arousal/Alertness: Awake/alert Behavior During Therapy: Flat affect Overall Cognitive Status: History of cognitive impairments - at baseline                                  General Comments: severe dementia at baseline      Exercises      General Comments        Pertinent Vitals/Pain Pain Assessment: Faces Faces Pain Scale: Hurts little more Pain Location: left leg with movement Pain Descriptors / Indicators: (pt cussing with L LE movement) Pain Intervention(s): Limited activity within patient's tolerance;Monitored during session;Repositioned    Home Living                      Prior Function            PT Goals (current goals can now be found in the care plan section) Progress towards PT goals: Not progressing toward goals - comment    Frequency    Min 2X/week      PT Plan Current plan remains appropriate    Co-evaluation              AM-PAC PT "6 Clicks" Mobility   Outcome Measure  Help needed turning from your back to your side while in a flat bed without using bedrails?: Total Help needed moving from lying on your back to sitting on  the side of a flat bed without using bedrails?: Total Help needed moving to and from a bed to a chair (including a wheelchair)?: Total Help needed standing up from a chair using your arms (e.g., wheelchair or bedside chair)?: Total Help needed to walk in hospital room?: Total Help needed climbing 3-5 steps with a railing? : Total 6 Click Score: 6    End of Session Equipment Utilized During Treatment: Gait belt Activity Tolerance: Patient tolerated treatment well Patient left: with call bell/phone within reach;in bed;with bed alarm set;with SCD's reapplied Nurse Communication: Mobility status;Need for lift equipment PT Visit Diagnosis: Unsteadiness on feet (R26.81);Muscle weakness (generalized) (M62.81);History of falling (Z91.81);Difficulty in walking, not elsewhere classified (R26.2);Pain Pain - Right/Left: Left Pain - part of body: Leg     Time: [id number] PT Time Calculation (min) (ACUTE ONLY): 25 min  Charges:  $Therapeutic Activity: 23-37 mins                      Earney Navy, PTA Acute Rehabilitation Services Pager: [phone] Office: [phone]     Darliss Cheney 08/08/2019, 3:58 PM

## 2019-08-08 NOTE — Progress Notes (Signed)
   Subjective: Victoria Lloyd was asleep on rounds today but woke up to let us know she wasn't in any pain.   Objective:  Vital signs in last 24 hours: Vitals:   08/07/19 1544 08/07/19 2005 08/08/19 0418 08/08/19 0909  BP: 95/60 (!) 112/57 (!) 102/52 (!) 115/46  Pulse: 74 67 97 90  Resp: 16 16 18 17   Temp:  99.1 F (37.3 C) 98.7 F (37.1 C) 98.1 F (36.7 C)  TempSrc: Oral Axillary Oral Oral  SpO2: 99% 99% 97% 98%  Weight:      Height:        Physical Exam  Constitutional: She is oriented to person, place, and time. No distress.  Pulmonary/Chest: Effort normal. No respiratory distress.  Neurological: She is oriented to person, place, and time.  Skin: She is not diaphoretic.  Nursing note and vitals reviewed. Declined physical exam  Assessment/Plan:  Principal Problem:   Intertrochanteric fracture of left hip (HCC) Active Problems:   Fever of unknown origin (FUO)   Altered mental status   Dementia ([place])  This is a 85 year old female with history of dementia, hypertension, CKD stage III, HFpEF, 3rd degree heart block status post pacemaker who initially presented with a fever and altered mental status, noted to have a left femoral neck fracture on imaging.  Status post left hip arthroplasty on 08/01/19.  Awaiting SNF placement.  Intertrochanteric fracture of left hip: -Status post left hip arthroplasty.  PT/OT recommends SNF on discharge.  Patient denies pain today. Has OxyIR for pain control. -PT/OT recommended SNF, appreciate social work assistance with this -OxyIR for pain control  Urinary retention: -Has been having trouble urinating following her procedure, foley in place  HFpEF: Stable, euvolemic on exam.  Continue metoprolol  Dispo: Patient is medically ready for discharge, awaiting SNF/ALF placement.   Al Decant, MD 08/08/2019, 11:33 AM Pager: [phone]

## 2019-08-08 NOTE — TOC Progression Note (Addendum)
Transition of Care [facility]) - Progression Note    Patient Details  Name: Victoria Lloyd MRN: [id number] Date of Birth: [birth year]  Transition of Care [facility]) CM/SW [place], [place] Phone Number: 08/08/2019, 12:08 PM  Clinical Narrative:     Update: CSW spoke with the patient's son and he is agreeable to looking over available bed offers and will make a decision. CSW stated that he needed to make a decision no later than Tuesday morning so that insurance authorization could be started. CSW will continue to follow.   __________________________________  CSW contacted Claiborne Billings with [facility]. They are not able to extend a bed offer due to not having any beds available.   CSW reached out to Elyse Hsu at [place] [facility]. Both are not able to extend bed offers to the patient at this time. [facility] did extend an offer this morning but resented the bed offer due to not having a definite answer about her foley. The patient's memory care is not taking her back with a foley, she has to be able to void on her own. [facility] is concerned that they might not be able to send her back to her memory care and do not want to get stuck with her.   CSW reached out to the patient's son. He is becoming frustrated due to his mother not being able to get into SNF's. CSW stated that she would continue to follow and assist with discharge planning.     Expected Discharge Plan: Skilled Nursing Facility Barriers to Discharge: SNF Pending bed offer, Insurance Authorization  Expected Discharge Plan and Services Expected Discharge Plan: [place] In-house Referral: Clinical Social Work Discharge Planning Services: Other - See comment(HH, Equipement, PT/OT if needed) Post Acute Care Choice: [place] arrangements for the past 2 months: [place] Expected Discharge Date: 08/08/19                                     Social  Determinants of Health (SDOH) Interventions    Readmission Risk Interventions No flowsheet data found.

## 2019-08-08 NOTE — Progress Notes (Signed)
  Date: 08/08/2019  Patient name: Victoria Lloyd  Medical record number: [id number]  Date of birth: 07/24/1922        I have seen and evaluated this patient and I have discussed the plan of care with the house staff. Please see Dr. Webb Silversmith note for complete details. I concur with her findings and plan.  Family has not made SNF choice yet.  Please see CM note.  She is medically ready for discharge.    Sid Falcon, MD 08/08/2019, 4:56 PM

## 2019-08-09 DIAGNOSIS — Z91013 Allergy to seafood: Secondary | ICD-10-CM

## 2019-08-09 DIAGNOSIS — Z91011 Allergy to milk products: Secondary | ICD-10-CM

## 2019-08-09 DIAGNOSIS — Z885 Allergy status to narcotic agent status: Secondary | ICD-10-CM

## 2019-08-09 DIAGNOSIS — Z881 Allergy status to other antibiotic agents status: Secondary | ICD-10-CM

## 2019-08-09 NOTE — Plan of Care (Signed)
  Problem: Urinary Elimination: Goal: Signs and symptoms of infection will decrease Outcome: Progressing   Problem: Activity: Goal: Risk for activity intolerance will decrease Outcome: Progressing   Problem: Nutrition: Goal: Adequate nutrition will be maintained Outcome: Progressing   Problem: Pain Managment: Goal: General experience of comfort will improve Outcome: Progressing   Problem: Safety: Goal: Ability to remain free from injury will improve Outcome: Progressing   Problem: Skin Integrity: Goal: Risk for impaired skin integrity will decrease Outcome: Progressing

## 2019-08-09 NOTE — Progress Notes (Signed)
   Subjective: Ms. Ilg is asleep in bed during rounds today but woke up when we came by. She did not nod yes or no or respond verbally when asked if she had any pain. She nodded yes when asked if she wanted [place] to let her sleep.   Objective:  Vital signs in last 24 hours: Vitals:   08/08/19 0909 08/08/19 1636 08/08/19 2026 08/09/19 0338  BP: (!) 115/46 129/68 (!) 111/52 (!) 104/52  Pulse: 90 77 84 72  Resp: 17 16 16 16   Temp: 98.1 F (36.7 C) 98.7 F (37.1 C) 98.2 F (36.8 C) 98.4 F (36.9 C)  TempSrc: Oral Oral Oral Axillary  SpO2: 98% 99% 100% 96%  Weight:      Height:        Physical Exam  Constitutional: No distress.  HENT:  Head: Normocephalic and atraumatic.  Cardiovascular:  Declined cardiovascular exam  Pulmonary/Chest: Effort normal and breath sounds normal. No respiratory distress.  Abdominal: Soft. Bowel sounds are normal. She exhibits no distension.  Musculoskeletal:     Comments: Non tender to palpation of left hip   Neurological: She is alert.  Skin: She is not diaphoretic.  Nursing note and vitals reviewed. Declined cardiovascular exam  Assessment/Plan:  Principal Problem:   Intertrochanteric fracture of left hip (HCC) Active Problems:   Fever of unknown origin (FUO)   Altered mental status   Dementia ([place])  This is a 85 year old female with history of dementia, hypertension, CKD stage III, HFpEF, 3rd degree heart block status post pacemaker who initially presented with a fever and altered mental status, noted to have a left femoral neck fracture on imaging.  Status post left hip arthroplasty on 08/01/19.  Awaiting SNF placement.  Intertrochanteric fracture of left hip: -Status post left hip arthroplasty.  PT/OT recommends SNF on discharge.  Patient non tender to palpation today. Has OxyIR PRN for pain control. -Appreciate social work assistance w/ discharge to SNF; pt at high risk of further deconditioning and physical decline, including possible  immobility of operatively fixed left hip without regular therapy while remaining in the hospital when in need of SNF placement  Urinary retention: -Foley in place due to patient lack of awareness of urinary retention, need to urinate and inability to urinate on her own secondary to her dementia, likely will be a chronic foley   HFpEF: Stable, euvolemic on exam.  Continue metoprolol  Dementia: -on home Risperdal; EKG today to recheck qtc  Dispo: Patient is medically ready for discharge and at risk of decline while remaining in the hospital, awaiting SNF placement   Al Decant, MD 08/09/2019, 6:09 AM Pager: [phone]

## 2019-08-09 NOTE — TOC Transition Note (Signed)
Transition of Care [facility]) - CM/SW Discharge Note   Patient Details  Name: Victoria Lloyd MRN: [id number] Date of Birth: 07-07-1922  Transition of Care [facility]) CM/SW Contact:  Weston Anna, LCSW Phone Number: 08/09/2019, 2:26 PM   Clinical Narrative:     Patient set to discharge to [facility] today. CSW spoke with patients son, Victoria Lloyd, via phone to notify of discharge. Patient will need transportation via [facility]- scheduled for 4PM. Please call report to [phone]  Final next level of care: Skilled Nursing Facility Barriers to Discharge: No Barriers Identified   Patient Goals and CMS Choice Patient states their goals for this hospitalization and ongoing recovery are:: For patient to continue recuperation and work with PT/OT in a familiar environment CMS Medicare.gov Compare Post Acute Care list provided to:: Patient Represenative (must comment) Choice offered to / list presented to : Adult Children  Discharge Placement PASRR number recieved: 08/09/19            Patient chooses bed at: [facility] Patient to be transferred to facility by: [facility] Name of family member notified: Victoria Lloyd Patient and family notified of of transfer: 08/09/19  Discharge Plan and Services In-house Referral: Clinical Social Work Discharge Planning Services: Other - See comment(HH, Equipement, PT/OT if needed) Post Acute Care Choice: [facility]          DME Arranged: N/A         HH Arranged: NA          Social Determinants of Health (SDOH) Interventions     Readmission Risk Interventions No flowsheet data found.

## 2019-08-09 NOTE — Progress Notes (Signed)
  Date: 08/09/2019  Patient name: Victoria Lloyd  Medical record number: [id number]  Date of birth: Apr 22, 1922        I have seen and evaluated this patient and I have discussed the plan of care with the house staff. Please see Dr. Webb Silversmith note for complete details. I concur with her findings and plan.  Plan for discharge today.   Sid Falcon, MD 08/09/2019, 4:39 PM

## 2019-08-09 NOTE — Progress Notes (Signed)
Patient discharging to [facility] health care. Report called in to [facility] LPN, awaiting transportation.

## 2019-08-22 ENCOUNTER — Encounter: Payer: Self-pay | Admitting: Student

## 2019-09-03 ENCOUNTER — Emergency Department (HOSPITAL_COMMUNITY): Payer: Medicare Other

## 2019-09-03 ENCOUNTER — Inpatient Hospital Stay (HOSPITAL_COMMUNITY)
Admission: EM | Admit: 2019-09-03 | Discharge: 2019-09-07 | DRG: 640 | Disposition: A | Discharge status: Home Health | Payer: Medicare Other | Source: Skilled Nursing Facility | Attending: Internal Medicine | Admitting: Internal Medicine

## 2019-09-03 DIAGNOSIS — E872 Acidosis: Secondary | ICD-10-CM | POA: Diagnosis present

## 2019-09-03 DIAGNOSIS — L89891 Pressure ulcer of other site, stage 1: Secondary | ICD-10-CM | POA: Diagnosis present

## 2019-09-03 DIAGNOSIS — L89899 Pressure ulcer of other site, unspecified stage: Secondary | ICD-10-CM | POA: Diagnosis not present

## 2019-09-03 DIAGNOSIS — I130 Hypertensive heart and chronic kidney disease with heart failure and stage 1 through stage 4 chronic kidney disease, or unspecified chronic kidney disease: Secondary | ICD-10-CM | POA: Diagnosis present

## 2019-09-03 DIAGNOSIS — L89526 Pressure-induced deep tissue damage of left ankle: Secondary | ICD-10-CM | POA: Diagnosis present

## 2019-09-03 DIAGNOSIS — E440 Moderate protein-calorie malnutrition: Secondary | ICD-10-CM | POA: Diagnosis present

## 2019-09-03 DIAGNOSIS — Z885 Allergy status to narcotic agent status: Secondary | ICD-10-CM

## 2019-09-03 DIAGNOSIS — Z881 Allergy status to other antibiotic agents status: Secondary | ICD-10-CM | POA: Diagnosis not present

## 2019-09-03 DIAGNOSIS — L89216 Pressure-induced deep tissue damage of right hip: Secondary | ICD-10-CM | POA: Diagnosis present

## 2019-09-03 DIAGNOSIS — Z950 Presence of cardiac pacemaker: Secondary | ICD-10-CM

## 2019-09-03 DIAGNOSIS — Z87440 Personal history of urinary (tract) infections: Secondary | ICD-10-CM

## 2019-09-03 DIAGNOSIS — N390 Urinary tract infection, site not specified: Secondary | ICD-10-CM | POA: Diagnosis present

## 2019-09-03 DIAGNOSIS — R64 Cachexia: Secondary | ICD-10-CM | POA: Diagnosis present

## 2019-09-03 DIAGNOSIS — R1310 Dysphagia, unspecified: Secondary | ICD-10-CM | POA: Diagnosis not present

## 2019-09-03 DIAGNOSIS — L89616 Pressure-induced deep tissue damage of right heel: Secondary | ICD-10-CM | POA: Diagnosis present

## 2019-09-03 DIAGNOSIS — L89896 Pressure-induced deep tissue damage of other site: Secondary | ICD-10-CM | POA: Diagnosis present

## 2019-09-03 DIAGNOSIS — I959 Hypotension, unspecified: Secondary | ICD-10-CM | POA: Diagnosis not present

## 2019-09-03 DIAGNOSIS — L89126 Pressure-induced deep tissue damage of left upper back: Secondary | ICD-10-CM | POA: Diagnosis present

## 2019-09-03 DIAGNOSIS — L89894 Pressure ulcer of other site, stage 4: Secondary | ICD-10-CM | POA: Diagnosis present

## 2019-09-03 DIAGNOSIS — F0390 Unspecified dementia without behavioral disturbance: Secondary | ICD-10-CM | POA: Diagnosis not present

## 2019-09-03 DIAGNOSIS — Z7189 Other specified counseling: Secondary | ICD-10-CM

## 2019-09-03 DIAGNOSIS — L89892 Pressure ulcer of other site, stage 2: Secondary | ICD-10-CM | POA: Diagnosis present

## 2019-09-03 DIAGNOSIS — L89151 Pressure ulcer of sacral region, stage 1: Secondary | ICD-10-CM | POA: Diagnosis present

## 2019-09-03 DIAGNOSIS — Z96642 Presence of left artificial hip joint: Secondary | ICD-10-CM | POA: Diagnosis present

## 2019-09-03 DIAGNOSIS — F0391 Unspecified dementia with behavioral disturbance: Secondary | ICD-10-CM | POA: Diagnosis present

## 2019-09-03 DIAGNOSIS — L89211 Pressure ulcer of right hip, stage 1: Secondary | ICD-10-CM | POA: Diagnosis present

## 2019-09-03 DIAGNOSIS — I5032 Chronic diastolic (congestive) heart failure: Secondary | ICD-10-CM | POA: Diagnosis present

## 2019-09-03 DIAGNOSIS — E860 Dehydration: Secondary | ICD-10-CM | POA: Diagnosis present

## 2019-09-03 DIAGNOSIS — F05 Delirium due to known physiological condition: Secondary | ICD-10-CM | POA: Diagnosis present

## 2019-09-03 DIAGNOSIS — R627 Adult failure to thrive: Secondary | ICD-10-CM | POA: Diagnosis present

## 2019-09-03 DIAGNOSIS — R339 Retention of urine, unspecified: Secondary | ICD-10-CM | POA: Diagnosis present

## 2019-09-03 DIAGNOSIS — L89629 Pressure ulcer of left heel, unspecified stage: Secondary | ICD-10-CM

## 2019-09-03 DIAGNOSIS — I442 Atrioventricular block, complete: Secondary | ICD-10-CM | POA: Diagnosis present

## 2019-09-03 DIAGNOSIS — L89619 Pressure ulcer of right heel, unspecified stage: Secondary | ICD-10-CM | POA: Diagnosis not present

## 2019-09-03 DIAGNOSIS — F419 Anxiety disorder, unspecified: Secondary | ICD-10-CM | POA: Diagnosis present

## 2019-09-03 DIAGNOSIS — Z9181 History of falling: Secondary | ICD-10-CM

## 2019-09-03 DIAGNOSIS — Z681 Body mass index (BMI) 19 or less, adult: Secondary | ICD-10-CM

## 2019-09-03 DIAGNOSIS — Z7401 Bed confinement status: Secondary | ICD-10-CM | POA: Diagnosis not present

## 2019-09-03 DIAGNOSIS — Z960 Presence of urogenital implants: Secondary | ICD-10-CM | POA: Diagnosis present

## 2019-09-03 DIAGNOSIS — Z882 Allergy status to sulfonamides status: Secondary | ICD-10-CM

## 2019-09-03 DIAGNOSIS — Z8249 Family history of ischemic heart disease and other diseases of the circulatory system: Secondary | ICD-10-CM

## 2019-09-03 DIAGNOSIS — L89221 Pressure ulcer of left hip, stage 1: Secondary | ICD-10-CM | POA: Diagnosis present

## 2019-09-03 DIAGNOSIS — L89226 Pressure-induced deep tissue damage of left hip: Secondary | ICD-10-CM | POA: Diagnosis present

## 2019-09-03 DIAGNOSIS — E870 Hyperosmolality and hypernatremia: Principal | ICD-10-CM | POA: Diagnosis present

## 2019-09-03 DIAGNOSIS — L03115 Cellulitis of right lower limb: Secondary | ICD-10-CM | POA: Diagnosis present

## 2019-09-03 DIAGNOSIS — Z8781 Personal history of (healed) traumatic fracture: Secondary | ICD-10-CM

## 2019-09-03 DIAGNOSIS — R4182 Altered mental status, unspecified: Secondary | ICD-10-CM | POA: Diagnosis present

## 2019-09-03 DIAGNOSIS — Z20828 Contact with and (suspected) exposure to other viral communicable diseases: Secondary | ICD-10-CM | POA: Diagnosis present

## 2019-09-03 DIAGNOSIS — Z7982 Long term (current) use of aspirin: Secondary | ICD-10-CM

## 2019-09-03 DIAGNOSIS — N184 Chronic kidney disease, stage 4 (severe): Secondary | ICD-10-CM | POA: Diagnosis present

## 2019-09-03 DIAGNOSIS — L8990 Pressure ulcer of unspecified site, unspecified stage: Secondary | ICD-10-CM | POA: Diagnosis present

## 2019-09-03 DIAGNOSIS — I110 Hypertensive heart disease with heart failure: Secondary | ICD-10-CM | POA: Diagnosis not present

## 2019-09-03 DIAGNOSIS — Z91011 Allergy to milk products: Secondary | ICD-10-CM | POA: Diagnosis not present

## 2019-09-03 DIAGNOSIS — L89516 Pressure-induced deep tissue damage of right ankle: Secondary | ICD-10-CM | POA: Diagnosis present

## 2019-09-03 DIAGNOSIS — Z79899 Other long term (current) drug therapy: Secondary | ICD-10-CM

## 2019-09-03 DIAGNOSIS — Z8673 Personal history of transient ischemic attack (TIA), and cerebral infarction without residual deficits: Secondary | ICD-10-CM

## 2019-09-03 DIAGNOSIS — Z79890 Hormone replacement therapy (postmenopausal): Secondary | ICD-10-CM

## 2019-09-03 DIAGNOSIS — Z791 Long term (current) use of non-steroidal anti-inflammatories (NSAID): Secondary | ICD-10-CM

## 2019-09-03 LAB — COMPREHENSIVE METABOLIC PANEL
ALT: 37 U/L (ref 0–44)
ALT: 32 U/L (ref 0–44)
AST: 45 U/L — ABNORMAL HIGH (ref 15–41)
AST: 41 U/L (ref 15–41)
Albumin: 2 g/dL — ABNORMAL LOW (ref 3.5–5.0)
Albumin: 1.8 g/dL — ABNORMAL LOW (ref 3.5–5.0)
Alkaline Phosphatase: 93 U/L (ref 38–126)
Alkaline Phosphatase: 79 U/L (ref 38–126)
Anion gap: 11 (ref 5–15)
Anion gap: 9 (ref 5–15)
BUN: 30 mg/dL — ABNORMAL HIGH (ref 8–23)
BUN: 28 mg/dL — ABNORMAL HIGH (ref 8–23)
CO2: 22 mmol/L (ref 22–32)
CO2: 22 mmol/L (ref 22–32)
Calcium: 8.7 mg/dL — ABNORMAL LOW (ref 8.9–10.3)
Calcium: 8.1 mg/dL — ABNORMAL LOW (ref 8.9–10.3)
Chloride: 128 mmol/L — ABNORMAL HIGH (ref 98–111)
Chloride: 130 mmol/L — ABNORMAL HIGH (ref 98–111)
Creatinine, Ser: 1.06 mg/dL — ABNORMAL HIGH (ref 0.44–1.00)
Creatinine, Ser: 0.89 mg/dL (ref 0.44–1.00)
GFR calc Af Amer: 52 mL/min — ABNORMAL LOW (ref >60)
GFR calc Af Amer: >60 mL/min (ref >60)
GFR calc non Af Amer: 45 mL/min — ABNORMAL LOW (ref >60)
GFR calc non Af Amer: 55 mL/min — ABNORMAL LOW (ref >60)
Glucose, Bld: 116 mg/dL — ABNORMAL HIGH (ref 70–99)
Glucose, Bld: 110 mg/dL — ABNORMAL HIGH (ref 70–99)
Potassium: 3.6 mmol/L (ref 3.5–5.1)
Potassium: 3.7 mmol/L (ref 3.5–5.1)
Sodium: 161 mmol/L (ref 135–145)
Sodium: 161 mmol/L (ref 135–145)
Total Bilirubin: 1.2 mg/dL (ref 0.3–1.2)
Total Bilirubin: 0.8 mg/dL (ref 0.3–1.2)
Total Protein: 6.3 g/dL — ABNORMAL LOW (ref 6.5–8.1)
Total Protein: 5.3 g/dL — ABNORMAL LOW (ref 6.5–8.1)

## 2019-09-03 LAB — CBC WITH DIFFERENTIAL/PLATELET
Abs Immature Granulocytes: 0.06 10*3/uL (ref 0.00–0.07)
Basophils Absolute: 0 10*3/uL (ref 0.0–0.1)
Basophils Relative: 0 %
Eosinophils Absolute: 0 10*3/uL (ref 0.0–0.5)
Eosinophils Relative: 0 %
HCT: 45.1 % (ref 36.0–46.0)
Hemoglobin: 13.6 g/dL (ref 12.0–15.0)
Immature Granulocytes: 1 %
Lymphocytes Relative: 8 %
Lymphs Abs: 0.9 10*3/uL (ref 0.7–4.0)
MCH: 29 pg (ref 26.0–34.0)
MCHC: 30.2 g/dL (ref 30.0–36.0)
MCV: 96.2 fL (ref 80.0–100.0)
Monocytes Absolute: 0.6 10*3/uL (ref 0.1–1.0)
Monocytes Relative: 6 %
Neutro Abs: 9.1 10*3/uL — ABNORMAL HIGH (ref 1.7–7.7)
Neutrophils Relative %: 85 %
Platelets: 183 10*3/uL (ref 150–400)
RBC: 4.69 MIL/uL (ref 3.87–5.11)
RDW: 17.5 % — ABNORMAL HIGH (ref 11.5–15.5)
WBC: 10.6 10*3/uL — ABNORMAL HIGH (ref 4.0–10.5)
nRBC: 0.2 % (ref 0.0–0.2)

## 2019-09-03 LAB — URINALYSIS, ROUTINE W REFLEX MICROSCOPIC
Bilirubin Urine: NEGATIVE
Glucose, UA: NEGATIVE mg/dL
Ketones, ur: 5 mg/dL — AB
Nitrite: NEGATIVE
Protein, ur: 30 mg/dL — AB
Specific Gravity, Urine: 1.023 (ref 1.005–1.030)
WBC, UA: >50 WBC/hpf — ABNORMAL HIGH (ref 0–5)
pH: 5 (ref 5.0–8.0)

## 2019-09-03 LAB — LACTIC ACID, PLASMA
Lactic Acid, Venous: 2.4 mmol/L (ref 0.5–1.9)
Lactic Acid, Venous: 2.2 mmol/L (ref 0.5–1.9)

## 2019-09-03 LAB — SARS CORONAVIRUS 2 BY RT PCR (HOSPITAL ORDER, PERFORMED IN ~~LOC~~ HOSPITAL LAB): SARS Coronavirus 2: NEGATIVE

## 2019-09-03 LAB — CBG MONITORING, ED: Glucose-Capillary: 87 mg/dL (ref 70–99)

## 2019-09-03 LAB — PHOSPHORUS: Phosphorus: 2.7 mg/dL (ref 2.5–4.6)

## 2019-09-03 LAB — MAGNESIUM: Magnesium: 2.1 mg/dL (ref 1.7–2.4)

## 2019-09-03 MED ORDER — SODIUM CHLORIDE 0.9 % IV SOLN
1.0000 g | Freq: Once | INTRAVENOUS | Status: AC
  Administered 2019-09-03: 15:00:00 1 g via INTRAVENOUS
  Filled 2019-09-03: qty 10

## 2019-09-03 MED ORDER — ONDANSETRON 4 MG PO TBDP: 4.0000 mg | ORAL_TABLET | Freq: Three times a day (TID) | ORAL | Status: DC | PRN

## 2019-09-03 MED ORDER — SODIUM CHLORIDE 0.9 % IV BOLUS
1000.0000 mL | Freq: Once | INTRAVENOUS | Status: AC
  Administered 2019-09-03: 14:00:00 1000 mL via INTRAVENOUS

## 2019-09-03 MED ORDER — RISPERIDONE 1 MG PO TABS
1.0000 mg | ORAL_TABLET | Freq: Every day | ORAL | Status: DC
  Administered 2019-09-04 – 2019-09-06 (×3): 1 mg via ORAL
  Filled 2019-09-03 (×3): qty 1

## 2019-09-03 MED ORDER — SODIUM CHLORIDE 0.9 % IV SOLN
1.0000 g | INTRAVENOUS | Status: DC
  Administered 2019-09-04 – 2019-09-05 (×2): 1 g via INTRAVENOUS
  Filled 2019-09-03 (×2): qty 10

## 2019-09-03 MED ORDER — SENNA 8.6 MG PO TABS
2.0000 | ORAL_TABLET | Freq: Every day | ORAL | Status: DC
  Administered 2019-09-04: 11:00:00 17.2 mg via ORAL
  Filled 2019-09-03 (×2): qty 2

## 2019-09-03 MED ORDER — RISPERIDONE 1 MG/ML PO SOLN: 1.2500 mg | Freq: Every day | ORAL | Status: DC

## 2019-09-03 MED ORDER — PANTOPRAZOLE SODIUM 20 MG PO TBEC
20.0000 mg | DELAYED_RELEASE_TABLET | Freq: Two times a day (BID) | ORAL | Status: DC
  Administered 2019-09-04 – 2019-09-07 (×5): 20 mg via ORAL
  Filled 2019-09-03 (×7): qty 1

## 2019-09-03 MED ORDER — VANCOMYCIN HCL IN DEXTROSE 1-5 GM/200ML-% IV SOLN
1000.0000 mg | INTRAVENOUS | Status: DC
  Administered 2019-09-03: 23:00:00 1000 mg via INTRAVENOUS
  Filled 2019-09-03: qty 200

## 2019-09-03 MED ORDER — POTASSIUM CHLORIDE ER 10 MEQ PO TBCR
10.0000 meq | EXTENDED_RELEASE_TABLET | Freq: Every day | ORAL | Status: DC
  Administered 2019-09-04 – 2019-09-07 (×3): 10 meq via ORAL
  Filled 2019-09-03 (×9): qty 1

## 2019-09-03 MED ORDER — SENNOSIDES-DOCUSATE SODIUM 8.6-50 MG PO TABS: 1.0000 | ORAL_TABLET | Freq: Every evening | ORAL | Status: DC | PRN

## 2019-09-03 MED ORDER — LACTATED RINGERS IV BOLUS
1000.0000 mL | Freq: Once | INTRAVENOUS | Status: AC
  Administered 2019-09-03: 18:00:00 1000 mL via INTRAVENOUS

## 2019-09-03 MED ORDER — DEXTROSE 5 % IV SOLN
INTRAVENOUS | Status: DC
  Administered 2019-09-03 – 2019-09-04 (×2): via INTRAVENOUS

## 2019-09-03 MED ORDER — TAMSULOSIN HCL 0.4 MG PO CAPS
0.4000 mg | ORAL_CAPSULE | Freq: Every day | ORAL | Status: DC
  Administered 2019-09-04 – 2019-09-07 (×3): 0.4 mg via ORAL
  Filled 2019-09-03 (×4): qty 1

## 2019-09-03 MED ORDER — ACETAMINOPHEN 650 MG RE SUPP: 650.0000 mg | Freq: Four times a day (QID) | RECTAL | Status: DC | PRN

## 2019-09-03 MED ORDER — ACETAMINOPHEN 325 MG PO TABS: 650.0000 mg | ORAL_TABLET | Freq: Four times a day (QID) | ORAL | Status: DC | PRN

## 2019-09-03 MED ORDER — ASPIRIN 81 MG PO CHEW
81.0000 mg | CHEWABLE_TABLET | Freq: Every day | ORAL | Status: DC
  Administered 2019-09-04 – 2019-09-06 (×3): 81 mg via ORAL
  Filled 2019-09-03 (×4): qty 1

## 2019-09-03 NOTE — Progress Notes (Signed)
Pharmacy Antibiotic Note  Victoria Lloyd is a 85 y.o. female admitted on 09/03/2019 with failure to thrive.  Pharmacy has been consulted for vancomycin dosing for cellulitis.  She is also on Rocephin.  SCr 1.06, CrCL 26 ml/min, afebrile, WBC 10.6, LA down to 2.2.  Plan: Vanc 1gm IV Q48H for AUC 522 using SCr 1.06 CTX 1gm IV Q24H per MD Monitor renal fxn, clinical progress, vanc AUC as indicated  Weight: 113 lb 8.6 oz (51.5 kg)  Temp (24hrs), Avg:98.1 F (36.7 C), Min:98.1 F (36.7 C), Max:98.1 F (36.7 C)  Recent Labs  Lab 09/03/19 1325 09/03/19 1458  WBC 10.6*  --   CREATININE 1.06*  --   LATICACIDVEN 2.4* 2.2*    Estimated Creatinine Clearance: 25.8 mL/min (A) (by C-G formula based on SCr of 1.06 mg/dL (H)).    Allergies  Allergen Reactions  . Codeine Other (See Comments)    Unknown; patient cannot recall the reaction  . Sulfa Antibiotics Other (See Comments)    Unknown; patient cannot recall the reaction  . Lactose Intolerance (Gi) Nausea And Vomiting  . Milk-Related Compounds Nausea And Vomiting  . Other Other (See Comments)    "Seafood"- unknown reaction, listed on MAR.   Vanc 10/10 >> CTX 10/10 >>  10/10 covid - negative 10/10 BCx -   Derl Abalos D. Mina Marble, PharmD, BCPS, [place] 09/03/2019, 6:53 PM

## 2019-09-03 NOTE — H&P (Addendum)
NAME:  Victoria Lloyd, MRN:  [id number], DOB:  January 18, 1922, LOS: 0 ADMISSION DATE:  09/03/2019,Primary: Patient, No Pcp Per  CHIEF COMPLAINT:  Failure to thrive, hypernatremia  Medical Service: Internal Medicine Teaching Service         Attending Physician: Dr. Velna Ochs, MD    First Contact: Dr. Darrick Meigs Pager: [phone]  Second Contact: Dr. Sharon Seller Pager: [phone]       After Hours (After 5p/  First Contact Pager: [phone]  weekends / holidays): Second Contact Pager: [phone]    History of present illness   85 yo female who is presenting today for altered mental status. History obtained from chart review. Hip fracture occured over labor day. Has been been refusing all care since that time. Has been bed bound since that time as well and has, overall, developed a failure to thrive. Today, it is noted that she developed dyspnea.  Upon arrival to ED, pt found to be hypernatremic--161 and have UTI. Per son's report, patient has had declining mentation since the surgery. She really isn't eating much.    Past Medical History  She,  has a past medical history of Chronic diastolic (congestive) heart failure ([place]), CKD (chronic kidney disease), stage IV ([place]), Complete heart block ([place]), Hypertension, and Stroke ([place]) (02/01/2018).   Home Medications     Prior to Admission medications   Medication Sig Start Date End Date Taking? Authorizing Provider  acetaminophen (TYLENOL) 325 MG tablet Take 1-2 tablets (325-650 mg total) by mouth every 4 (four) hours as needed for mild pain. Patient taking differently: Take 325 mg by mouth every 4 (four) hours as needed for mild pain.  08/06/14   Erlene Quan, PA-C  ALPRAZolam [name]) 0.25 MG tablet Take 0.5 tablets (0.125 mg total) by mouth at bedtime. Patient taking differently: Take 0.25 mg by mouth at bedtime as needed (insomnia agitation).  02/03/18   Raiford Noble Latif, DO  aspirin 81 MG tablet Take 81 mg by mouth at bedtime.     [provider]  atorvastatin (LIPITOR) 10 MG tablet Take 10 mg by mouth at bedtime. 08/05/18   [provider]  calcium-vitamin D (OSCAL WITH D) 500-200 MG-UNIT per tablet Take 1 tablet by mouth at bedtime.     [provider]  cholecalciferol (VITAMIN D) 1000 units tablet Take 1,000 Units by mouth at bedtime.    [provider]  cycloSPORINE (RESTASIS) 0.05 % ophthalmic emulsion Place 1 drop into both eyes 2 (two) times daily as needed (dry eyes).     [provider]  dextromethorphan-guaiFENesin (MUCINEX DM) 30-600 MG 12hr tablet Take 1 tablet by mouth 2 (two) times daily. Patient not taking: Reported on 02/01/2018 06/06/16   Cristal Ford, DO  diclofenac sodium (VOLTAREN) 1 % GEL Apply 2 g topically 4 (four) times daily. 02/03/18   Sheikh, Omair Latif, DO  Emollient (FLANDERS BUTTOCKS) OINT Apply 1 application topically See admin instructions. With each diaper change    [provider]  enoxaparin (LOVENOX) 30 MG/0.3ML injection Inject 0.3 mLs (30 mg total) into the skin daily. 08/03/19 09/02/19  Swinteck, Aaron Edelman, MD  furosemide (LASIX) 20 MG tablet Take 2 tablets (40 mg total) by mouth daily. 08/11/19   Al Decant, MD  lidocaine (LIDODERM) 5 % Place 1 patch onto the skin daily. Patient not taking: Reported on 02/01/2018 06/06/16   Cristal Ford, DO  Melatonin 3 MG TABS Take 3 mg by mouth at bedtime as needed (sleep).    [provider]  metoprolol tartrate (LOPRESSOR) 25 MG tablet Take 0.5 tablets (12.5 mg total) by mouth 2 (two) times daily. Titrate up as tolerated 08/04/19   Al Decant, MD  Nutritional Supplements (NUTRITIONAL SHAKE PO) Take 1 each by mouth 2 (two) times daily between meals. Mighty Shake    [provider]  ondansetron (ZOFRAN-ODT) 4 MG disintegrating tablet Take 1 tablet (4 mg total) by mouth every 8 (eight) hours as needed for nausea or vomiting. 04/29/16   Hollice Gong, Mir Earlie Server, MD  oxyCODONE (OXY  IR/ROXICODONE) 5 MG immediate release tablet Take 1 tablet (5 mg total) by mouth every 6 (six) hours as needed for moderate pain or severe pain. 08/02/19   Swinteck, Aaron Edelman, MD  pantoprazole (PROTONIX) 20 MG tablet Take 1 tablet (20 mg total) by mouth 2 (two) times daily. 08/04/19   Al Decant, MD  potassium chloride (K-DUR) 10 MEQ tablet Take 1 tablet (10 mEq total) by mouth daily. 08/22/18   Thurnell Lose, MD  risperiDONE (RISPERDAL) 1 MG/ML oral solution Take 1.25 mg by mouth at bedtime. 08/15/18   [provider]  senna (SENOKOT) 8.6 MG TABS tablet Take 2 tablets (17.2 mg total) by mouth daily. 04/29/16   Tomma Rakers, MD    Allergies    Allergies as of 09/03/2019 - Review Complete 09/03/2019  Allergen Reaction Noted  . Codeine Other (See Comments) 03/15/2011  . Sulfa antibiotics Other (See Comments) 03/15/2011  . Lactose intolerance (gi) Nausea And Vomiting 06/03/2016  . Milk-related compounds Nausea And Vomiting 06/03/2016  . Other Other (See Comments) 08/19/2018    Social History   reports that she has never smoked. She has never used smokeless tobacco. She reports that she does not drink alcohol or use drugs.   Family History   Her family history includes Heart attack in her father; Hypertension in her mother.  ROS  Unable to be obtained due to encephalopathic state  Objective   Blood pressure (!) 113/53, pulse 78, temperature 98.1 F (36.7 C), temperature source Oral, resp. rate 14, weight 51.5 kg, SpO2 100 %.    No intake or output data in the 24 hours ending 09/03/19 1850 Filed Weights   09/03/19 1800  Weight: 51.5 kg    Examination: GENERAL: catechetic  HEENT: head atraumatic. No conjunctival injection. Nares patent.  CARDIAC: heart RRR. No peripheral edema.  PULMONARY: acyanotic. Lung sounds clear to auscultation. ABDOMEN: would not allow abdominal exam NEURO: a/ox0. Does not respond verbally. Does turn head toward voice. Became more  agitated with attempting further exam so will hold off for now. SKIN: several well dressed skin wounds located on right medial knee, right lower leg, right heel, left lower leg.    Consults:  Hospice  Significant Diagnostic Tests:   CXR: personally reviewed my interpretation is remarkable for hemidiaphragm. No infiltrate or effusion appreciated.  Labs   CBC: Recent Labs  Lab 09/03/19 1325  WBC 10.6*  NEUTROABS 9.1*  HGB 13.6  HCT 45.1  MCV 96.2  PLT [id number]    Basic Metabolic Panel: Recent Labs  Lab 09/03/19 1325  NA 161*  K 3.6  CL 128*  CO2 22  GLUCOSE 116*  BUN 30*  CREATININE 1.06*  CALCIUM 8.7*   GFR: Estimated Creatinine Clearance: 25.8 mL/min (A) (by C-G formula based on SCr of 1.06 mg/dL (H)). Recent Labs  Lab 09/03/19 1325 09/03/19 1458  WBC 10.6*  --   LATICACIDVEN 2.4* 2.2*    Liver Function Tests: Recent Labs  Lab 09/03/19 1325  AST 45*  ALT 37  ALKPHOS 93  BILITOT 1.2  PROT 6.3*  ALBUMIN 2.0*   Micro  10/10 blood cultures>> 10/10 urine cultures>>  ABX  Rocephin 10/10>> Vancomycin 10/10>>  Summary  85 yo female with PMH significant for HFpEF, 3rd degree heart block s/p pacemaker(2015), HTN, CKD4 and left displaced femoral neck fx s/p fixation (sept 2020) who is presenting for failure to thrive and was found with profound hypernatremia of 161 and UTI on admission.  Assessment & Plan:  Active Problems:   Altered mental status   Pressure injury of skin  Hypernatremia 161 on admission. Likely in setting of failure to thrive and poor po intake. 1.3L free water deficit.  Plan Tele monitor D5 IVF Sodium checks q6h Consider free water po once cleared by ST  Failure to thrive PT/OT.  Encourage nutrition if cleared by ST Will likely need parentarel nutrition if remaining full code and refusing po  Pressure ulcers 2/2 failure to thrive. Present on right knee, right lower leg, right heel, right medial foot, left foot.  Plan Wound  care consult. Appreciate their recs on dressing changes Turn q2h Vancomycin for cellulitis  Urinary retention. New since hip fx over labor day. Has had foley in since that time and has repeatedly failed voiding trials. Now has UTI as found on UA. Plan Rocephin for UTI Remove foley. Allow voiding trial. Bladder scan q4h. Straight cath as needed for retention. flomax for retention Repeat cbc in am  Dementia. Continue Risperdal. Diastolic heart failure. Does not appear to be in exacerbation at this time. Continue daily weights. Strict I/o HTN: hold antihypertensives.   Best practice:  CODE STATUS: FULL  Diet: NPO. Needs to be clears by ST prior to diet Pain management: tylenol IV fluids: D5 IVF DVT for prophylaxis:  Social considerations/Family communication: Code status is going to need to be readdressed in terms of appropriateness of this. Son states that he is a Stage manager out of [place]. Discussed comfort vs DNR vs full code. States that he "feels like we need to try to bring her back" if she arrests. Will try to re-address this with him tomorrow regarding the medical appropriateness of being a full code. Dispo: Admit patient to Inpatient with expected length of stay greater than 2 midnights. Plan is for d/c with hospice on monday   Keiarra Charon, MD [place] PGY-1 Pager # [phone] 09/03/19 6:50 PM

## 2019-09-03 NOTE — ED Notes (Signed)
Unable to obtain 2nd set of blood cultures, abx started

## 2019-09-03 NOTE — Progress Notes (Signed)
[facility] Update  Pt is eligible for hospice per Dr. Court Joy, hospice medical director. [facility] to continue to follow pt while in hospital and will admit pt to hospice services upon discharge.

## 2019-09-03 NOTE — ED Provider Notes (Signed)
[facility] EMERGENCY DEPARTMENT Provider Note   CSN: [id number] Arrival date & time: 09/03/19  1233     History   Chief Complaint Chief Complaint  Patient presents with  . Failure To Thrive    HPI Victoria Lloyd is a 85 y.o. female.     85 yo F with a chief complaint of failure to thrive.  Patient recently had a hip fracture repaired.  Has been in rehab and then went back to her typical nursing home.  When she arrived there they noticed that she was not as active as normal and had multiple new decubitus wounds.  Not eating and drinking well was sent here for evaluation.  No obvious infectious symptoms per the nursing home.  Patient is nonverbal.  Level 5 caveat.  The history is provided by the patient, the nursing home and the EMS personnel.  Illness Severity:  Moderate Onset quality:  Gradual Duration:  1 week Timing:  Constant Progression:  Worsening Chronicity:  New   Past Medical History:  Diagnosis Date  . Chronic diastolic (congestive) heart failure ([place])   . CKD (chronic kidney disease), stage IV ([place])   . Complete heart block (HCC)    a. s/p MDT dual chamber PPM   . Hypertension   . Stroke ([place]) 02/01/2018   CT scan =>  Chronic right PCA distribution infarct with right occipital lobe    Patient Active Problem List   Diagnosis Date Noted  . Left displaced femoral neck fracture ([place]) 07/31/2019  . Dementia ([place]) 07/31/2019  . Altered mental status 07/28/2019  . Acute encephalopathy 08/19/2018  . Fall at home, initial encounter 02/02/2018  . Fever of unknown origin (FUO) 02/01/2018  . UTI (lower urinary tract infection)   . Anxiety 06/03/2016  . SOB (shortness of breath) 06/03/2016  . Elevated troponin 06/03/2016  . CHF exacerbation ([place]) 06/03/2016  . Acute on chronic diastolic (congestive) heart failure ([place])   . Syncope 04/24/2016  . Pacemaker- St Jude impalnted 08/04/14 08/06/2014  . AV block, 3rd degree (HCC) 08/03/2014  .  Acute CHF- secondary to bradycardia on admision (Nl LVF by echo) 08/03/2014  . CKD (chronic kidney disease) stage 4, GFR 15-29 ml/min (HCC) 08/03/2014  . Bradycardia 08/03/2014  . Hypertension 03/15/2011    Past Surgical History:  Procedure Laterality Date  . ANTERIOR APPROACH HEMI HIP ARTHROPLASTY Left 08/01/2019   Procedure: ANTERIOR APPROACH HEMI HIP ARTHROPLASTY;  Surgeon: Rod Can, MD;  Location: [facility];  Service: Orthopedics;  Laterality: Left;  . CATARACT EXTRACTION    . PERMANENT PACEMAKER INSERTION N/A 08/04/2014   Procedure: PERMANENT PACEMAKER INSERTION;  Surgeon: Deboraha Sprang, MD;  Location: [facility] CATH LAB;  Service: Cardiovascular;  Laterality: N/A;     OB History   No obstetric history on file.      Home Medications    Prior to Admission medications   Medication Sig Start Date End Date Taking? Authorizing Provider  acetaminophen (TYLENOL) 325 MG tablet Take 1-2 tablets (325-650 mg total) by mouth every 4 (four) hours as needed for mild pain. Patient taking differently: Take 325 mg by mouth every 4 (four) hours as needed for mild pain.  08/06/14   Erlene Quan, PA-C  ALPRAZolam [name]) 0.25 MG tablet Take 0.5 tablets (0.125 mg total) by mouth at bedtime. Patient taking differently: Take 0.25 mg by mouth at bedtime as needed (insomnia agitation).  02/03/18   Raiford Noble Latif, DO  aspirin 81 MG tablet Take 81  mg by mouth at bedtime.     [provider]  atorvastatin (LIPITOR) 10 MG tablet Take 10 mg by mouth at bedtime. 08/05/18   [provider]  calcium-vitamin D (OSCAL WITH D) 500-200 MG-UNIT per tablet Take 1 tablet by mouth at bedtime.     [provider]  cholecalciferol (VITAMIN D) 1000 units tablet Take 1,000 Units by mouth at bedtime.    [provider]  cycloSPORINE (RESTASIS) 0.05 % ophthalmic emulsion Place 1 drop into both eyes 2 (two) times daily as needed (dry eyes).     [provider]   dextromethorphan-guaiFENesin (MUCINEX DM) 30-600 MG 12hr tablet Take 1 tablet by mouth 2 (two) times daily. Patient not taking: Reported on 02/01/2018 06/06/16   Cristal Ford, DO  diclofenac sodium (VOLTAREN) 1 % GEL Apply 2 g topically 4 (four) times daily. 02/03/18   Sheikh, Omair Latif, DO  Emollient (FLANDERS BUTTOCKS) OINT Apply 1 application topically See admin instructions. With each diaper change    [provider]  enoxaparin (LOVENOX) 30 MG/0.3ML injection Inject 0.3 mLs (30 mg total) into the skin daily. 08/03/19 09/02/19  Swinteck, Aaron Edelman, MD  furosemide (LASIX) 20 MG tablet Take 2 tablets (40 mg total) by mouth daily. 08/11/19   Al Decant, MD  lidocaine (LIDODERM) 5 % Place 1 patch onto the skin daily. Patient not taking: Reported on 02/01/2018 06/06/16   Cristal Ford, DO  Melatonin 3 MG TABS Take 3 mg by mouth at bedtime as needed (sleep).    [provider]  metoprolol tartrate (LOPRESSOR) 25 MG tablet Take 0.5 tablets (12.5 mg total) by mouth 2 (two) times daily. Titrate up as tolerated 08/04/19   Al Decant, MD  Nutritional Supplements (NUTRITIONAL SHAKE PO) Take 1 each by mouth 2 (two) times daily between meals. Mighty Shake    [provider]  ondansetron (ZOFRAN-ODT) 4 MG disintegrating tablet Take 1 tablet (4 mg total) by mouth every 8 (eight) hours as needed for nausea or vomiting. 04/29/16   Hollice Gong, Mir Earlie Server, MD  oxyCODONE (OXY IR/ROXICODONE) 5 MG immediate release tablet Take 1 tablet (5 mg total) by mouth every 6 (six) hours as needed for moderate pain or severe pain. 08/02/19   Swinteck, Aaron Edelman, MD  pantoprazole (PROTONIX) 20 MG tablet Take 1 tablet (20 mg total) by mouth 2 (two) times daily. 08/04/19   Al Decant, MD  potassium chloride (K-DUR) 10 MEQ tablet Take 1 tablet (10 mEq total) by mouth daily. 08/22/18   Thurnell Lose, MD  risperiDONE (RISPERDAL) 1 MG/ML oral solution Take 1.25 mg by mouth at bedtime. 08/15/18   [provider]  senna (SENOKOT) 8.6 MG TABS tablet Take 2 tablets (17.2 mg total) by mouth daily. 04/29/16   Tomma Rakers, MD    Family History Family History  Problem Relation Age of Onset  . Hypertension Mother   . Heart attack Father     Social History Social History   Tobacco Use  . Smoking status: Never Smoker  . Smokeless tobacco: Never Used  Substance Use Topics  . Alcohol use: No  . Drug use: No     Allergies   Codeine, Sulfa antibiotics, Lactose intolerance (gi), Milk-related compounds, and Other   Review of Systems Review of Systems  Unable to perform ROS: Patient nonverbal     Physical Exam Updated Vital Signs BP (!) 113/52   Pulse 81   Temp 98.1 F (36.7 C) (Oral)   Resp (!) 26  LMP  (LMP Unknown)   SpO2 100%   Physical Exam Vitals signs and nursing note reviewed.  Constitutional:      General: She is not in acute distress.    Appearance: She is well-developed. She is not diaphoretic.  HENT:     Head: Normocephalic and atraumatic.  Eyes:     Pupils: Pupils are equal, round, and reactive to light.  Neck:     Musculoskeletal: Normal range of motion and neck supple.  Cardiovascular:     Rate and Rhythm: Normal rate and regular rhythm.     Heart sounds: No murmur. No friction rub. No gallop.   Pulmonary:     Effort: Pulmonary effort is normal.     Breath sounds: No wheezing or rales.  Abdominal:     General: There is no distension.     Palpations: Abdomen is soft.     Tenderness: There is no abdominal tenderness.  Musculoskeletal:        General: Tenderness present.     Comments: Multiple ulcerations noted to the pressure points between the legs with a would lay together.  Some serous drainage but no appreciable purulent drainage.  No surrounding erythema.  Sacral area without ulceration.  Stage I ulceration to bilateral greater trochanters.  Skin:    General: Skin is warm and dry.  Neurological:     Mental Status: She is alert  and oriented to person, place, and time.  Psychiatric:        Behavior: Behavior normal.      ED Treatments / Results  Labs (all labs ordered are listed, but only abnormal results are displayed) Labs Reviewed  CBC WITH DIFFERENTIAL/PLATELET - Abnormal; Notable for the following components:      Result Value   WBC 10.6 (*)    RDW 17.5 (*)    Neutro Abs 9.1 (*)    All other components within normal limits  COMPREHENSIVE METABOLIC PANEL - Abnormal; Notable for the following components:   Sodium 161 (*)    Chloride 128 (*)    Glucose, Bld 116 (*)    BUN 30 (*)    Creatinine, Ser 1.06 (*)    Calcium 8.7 (*)    Total Protein 6.3 (*)    Albumin 2.0 (*)    AST 45 (*)    GFR calc non Af Amer 45 (*)    GFR calc Af Amer 52 (*)    All other components within normal limits  URINALYSIS, ROUTINE W REFLEX MICROSCOPIC - Abnormal; Notable for the following components:   Color, Urine AMBER (*)    APPearance CLOUDY (*)    Hgb urine dipstick MODERATE (*)    Ketones, ur 5 (*)    Protein, ur 30 (*)    Leukocytes,Ua LARGE (*)    WBC, UA >50 (*)    Bacteria, UA MANY (*)    All other components within normal limits  LACTIC ACID, PLASMA - Abnormal; Notable for the following components:   Lactic Acid, Venous 2.4 (*)    All other components within normal limits  SARS CORONAVIRUS 2 BY RT PCR (HOSPITAL ORDER, [facility])  URINE CULTURE  CULTURE, BLOOD (ROUTINE X 2)  CULTURE, BLOOD (ROUTINE X 2)  LACTIC ACID, PLASMA  CBG MONITORING, ED    EKG None  Radiology Dg Chest Port 1 View  Result Date: 09/03/2019 CLINICAL DATA:  Weakness. EXAM: PORTABLE CHEST 1 VIEW COMPARISON:  August 01, 2019. FINDINGS: Stable cardiomediastinal silhouette. Left-sided  pacemaker is unchanged. No pneumothorax or pleural effusion is noted. Stable elevated right hemidiaphragm is noted. No acute pulmonary disease is noted. Bony thorax is unremarkable. IMPRESSION: No acute cardiopulmonary  abnormality seen. Electronically Signed   By: Marijo Conception M.D.   On: 09/03/2019 13:21    Procedures Procedures (including critical care time)  Medications Ordered in ED Medications  cefTRIAXone (ROCEPHIN) 1 g in sodium chloride 0.9 % 100 mL IVPB (1 g Intravenous New Bag/Given 09/03/19 1508)  sodium chloride 0.9 % bolus 1,000 mL (1,000 mLs Intravenous New Bag/Given 09/03/19 1333)     Initial Impression / Assessment and Plan / ED Course  I have reviewed the triage vital signs and the nursing notes.  Pertinent labs & imaging results that were available during my care of the patient were reviewed by me and considered in my medical decision making (see chart for details).        85 yo F with a chief complaint of failure to thrive.  Patient was just seen in the hospital for her femur fracture.  Has been in rehab and then now back to her normal facility.  Less active than normal.  Decreased oral intake.  Sent here for evaluation.   Patient's lab work is consistent with failure to thrive.  Sodium is 161.  Patient also appears to have a urinary tract infection based on UA.  Will start on Rocephin.  Will discuss with the hospitalist.  Plan would be for gentle IV fluids and discharged to hospice on Monday.  CRITICAL CARE Performed by: Cecilio Asper   Total critical care time: 35 minutes  Critical care time was exclusive of separately billable procedures and treating other patients.  Critical care was necessary to treat or prevent imminent or life-threatening deterioration.  Critical care was time spent personally by me on the following activities: development of treatment plan with patient and/or surrogate as well as nursing, discussions with consultants, evaluation of patient's response to treatment, examination of patient, obtaining history from patient or surrogate, ordering and performing treatments and interventions, ordering and review of laboratory studies, ordering and  review of radiographic studies, pulse oximetry and re-evaluation of patient's condition.   The patients results and plan were reviewed and discussed.   Any x-rays performed were independently reviewed by myself.   Differential diagnosis were considered with the presenting HPI.  Medications  cefTRIAXone (ROCEPHIN) 1 g in sodium chloride 0.9 % 100 mL IVPB (1 g Intravenous New Bag/Given 09/03/19 1508)  sodium chloride 0.9 % bolus 1,000 mL (1,000 mLs Intravenous New Bag/Given 09/03/19 1333)    Vitals:   09/03/19 1330 09/03/19 1345 09/03/19 1400 09/03/19 1415  BP: 105/89 (!) 120/54 (!) 115/54 (!) 113/52  Pulse: 82 82 81 81  Resp: (!) 23  (!) 26   Temp:      TempSrc:      SpO2: 98% 100% 100% 100%    Final diagnoses:  Dehydration  Hypernatremia  Failure to thrive in adult  Lower urinary tract infectious disease    Admission/ observation were discussed with the admitting physician, patient and/or family and they are comfortable with the plan.    Final Clinical Impressions(s) / ED Diagnoses   Final diagnoses:  Dehydration  Hypernatremia  Failure to thrive in adult  Lower urinary tract infectious disease    ED Discharge Orders    None       Deno Etienne, DO 09/03/19 1511

## 2019-09-03 NOTE — ED Triage Notes (Signed)
Facility also reports poor fluid output, has foley upon arrival

## 2019-09-03 NOTE — ED Notes (Signed)
Attempted to call report

## 2019-09-03 NOTE — Progress Notes (Signed)
[facility] Documentation  ACC received referral via fax from Dr. Fredderick Phenix with [facility] on 09/03/2019 for hospice care. Writer called and spoke with pt's son who stated that pt had hip replaced on labor day and has been declining ever since. Pt would self propel around facility prior to breaking hip and is ow bed bound. Pt has minimal appetite. Incontinent of bowel and has foley placed due to urinary retention, per son.   Pt just returned back to [facility] this past week from [facility] where she received therapy. She was discharged back to [facility] with a foley that [facility] was to maintain. RN from [facility] went to provide foley care today and called son stating that pt needed to go to the hospital due to left wrist swelling and increased accessory muscle use with respirations. Son, Dr. Dorothea Glassman, stated that he did not know what to do so he ok'd transport to the hospital.   Son's goal for pt at this point is comfort and to keep pt out of the hospital if possible. Son does not want pt to discharge from hospital without hospice care in place and understands that [facility] would no longer be involved if pt elects Hospice. Son would like admission visit ASAP.  [facility] to schedule admission visit for pt and will continue to follow pt while in the hospital.   Freddie Breech, RN  [facility] Liaison [phone]

## 2019-09-03 NOTE — ED Triage Notes (Signed)
Pt presents from [facility] with EMS for failure to thrive. Returned wed from [facility] and has since not been eating well, drinking well, talking or generally behaving as she had previously. Home health requested call to EMS for tachypnea, 100% RA.  H/o hip surgery resulting in needing rehab/.   EMS exam - swelling to L hand (facility states present wed on arrival), 128 CBG, 57F, 119/57BP  Family on way.

## 2019-09-03 NOTE — ED Notes (Addendum)
Pt son reports that L wrist swelling was previously determined to be arthritis related at a previous visit. Adds that her urine has been darker lately, was found to have urinary retention per son. Son would also like staff to know that pt was much more responsive and spoke more.  Pt was to be assessed by Hospice today, but this has been rescheduled. When considering discharge, would like [place] to know that Hospice will be available on Monday for care.   Son - [phone] (Dr. Dorothea Glassman)

## 2019-09-04 DIAGNOSIS — Z91011 Allergy to milk products: Secondary | ICD-10-CM

## 2019-09-04 DIAGNOSIS — Z881 Allergy status to other antibiotic agents status: Secondary | ICD-10-CM

## 2019-09-04 DIAGNOSIS — N184 Chronic kidney disease, stage 4 (severe): Secondary | ICD-10-CM

## 2019-09-04 DIAGNOSIS — R627 Adult failure to thrive: Secondary | ICD-10-CM

## 2019-09-04 DIAGNOSIS — I442 Atrioventricular block, complete: Secondary | ICD-10-CM

## 2019-09-04 DIAGNOSIS — L89619 Pressure ulcer of right heel, unspecified stage: Secondary | ICD-10-CM

## 2019-09-04 DIAGNOSIS — Z8673 Personal history of transient ischemic attack (TIA), and cerebral infarction without residual deficits: Secondary | ICD-10-CM

## 2019-09-04 DIAGNOSIS — I130 Hypertensive heart and chronic kidney disease with heart failure and stage 1 through stage 4 chronic kidney disease, or unspecified chronic kidney disease: Secondary | ICD-10-CM

## 2019-09-04 DIAGNOSIS — Z950 Presence of cardiac pacemaker: Secondary | ICD-10-CM

## 2019-09-04 DIAGNOSIS — Z960 Presence of urogenital implants: Secondary | ICD-10-CM

## 2019-09-04 DIAGNOSIS — Z885 Allergy status to narcotic agent status: Secondary | ICD-10-CM

## 2019-09-04 DIAGNOSIS — L89899 Pressure ulcer of other site, unspecified stage: Secondary | ICD-10-CM

## 2019-09-04 DIAGNOSIS — R339 Retention of urine, unspecified: Secondary | ICD-10-CM

## 2019-09-04 DIAGNOSIS — R1310 Dysphagia, unspecified: Secondary | ICD-10-CM

## 2019-09-04 DIAGNOSIS — E870 Hyperosmolality and hypernatremia: Principal | ICD-10-CM

## 2019-09-04 DIAGNOSIS — Z8781 Personal history of (healed) traumatic fracture: Secondary | ICD-10-CM

## 2019-09-04 DIAGNOSIS — I5032 Chronic diastolic (congestive) heart failure: Secondary | ICD-10-CM

## 2019-09-04 DIAGNOSIS — F0390 Unspecified dementia without behavioral disturbance: Secondary | ICD-10-CM

## 2019-09-04 DIAGNOSIS — Z7401 Bed confinement status: Secondary | ICD-10-CM

## 2019-09-04 DIAGNOSIS — N390 Urinary tract infection, site not specified: Secondary | ICD-10-CM

## 2019-09-04 LAB — COMPREHENSIVE METABOLIC PANEL
ALT: 25 U/L (ref 0–44)
AST: 34 U/L (ref 15–41)
Albumin: 1.3 g/dL — ABNORMAL LOW (ref 3.5–5.0)
Alkaline Phosphatase: 63 U/L (ref 38–126)
Anion gap: 10 (ref 5–15)
BUN: 26 mg/dL — ABNORMAL HIGH (ref 8–23)
CO2: 18 mmol/L — ABNORMAL LOW (ref 22–32)
Calcium: 7.5 mg/dL — ABNORMAL LOW (ref 8.9–10.3)
Chloride: 129 mmol/L — ABNORMAL HIGH (ref 98–111)
Creatinine, Ser: 0.69 mg/dL (ref 0.44–1.00)
GFR calc Af Amer: >60 mL/min (ref >60)
GFR calc non Af Amer: >60 mL/min (ref >60)
Glucose, Bld: 192 mg/dL — ABNORMAL HIGH (ref 70–99)
Potassium: 4.3 mmol/L (ref 3.5–5.1)
Sodium: 157 mmol/L — ABNORMAL HIGH (ref 135–145)
Total Bilirubin: 0.8 mg/dL (ref 0.3–1.2)
Total Protein: 3.8 g/dL — ABNORMAL LOW (ref 6.5–8.1)

## 2019-09-04 LAB — URINALYSIS, ROUTINE W REFLEX MICROSCOPIC
Bilirubin Urine: NEGATIVE
Glucose, UA: NEGATIVE mg/dL
Ketones, ur: NEGATIVE mg/dL
Nitrite: NEGATIVE
Protein, ur: 30 mg/dL — AB
Specific Gravity, Urine: 1.024 (ref 1.005–1.030)
WBC, UA: >50 WBC/hpf — ABNORMAL HIGH (ref 0–5)
pH: 6 (ref 5.0–8.0)

## 2019-09-04 LAB — CBC WITH DIFFERENTIAL/PLATELET
Abs Immature Granulocytes: 0.06 10*3/uL (ref 0.00–0.07)
Basophils Absolute: 0 10*3/uL (ref 0.0–0.1)
Basophils Relative: 0 %
Eosinophils Absolute: 0.1 10*3/uL (ref 0.0–0.5)
Eosinophils Relative: 1 %
HCT: 39.6 % (ref 36.0–46.0)
Hemoglobin: 11.4 g/dL — ABNORMAL LOW (ref 12.0–15.0)
Immature Granulocytes: 1 %
Lymphocytes Relative: 11 %
Lymphs Abs: 1 10*3/uL (ref 0.7–4.0)
MCH: 27.9 pg (ref 26.0–34.0)
MCHC: 28.8 g/dL — ABNORMAL LOW (ref 30.0–36.0)
MCV: 97.1 fL (ref 80.0–100.0)
Monocytes Absolute: 0.6 10*3/uL (ref 0.1–1.0)
Monocytes Relative: 7 %
Neutro Abs: 7.4 10*3/uL (ref 1.7–7.7)
Neutrophils Relative %: 80 %
Platelets: 154 10*3/uL (ref 150–400)
RBC: 4.08 MIL/uL (ref 3.87–5.11)
RDW: 17.2 % — ABNORMAL HIGH (ref 11.5–15.5)
WBC: 9.2 10*3/uL (ref 4.0–10.5)
nRBC: 0.3 % — ABNORMAL HIGH (ref 0.0–0.2)

## 2019-09-04 LAB — BASIC METABOLIC PANEL
Anion gap: 10 (ref 5–15)
Anion gap: 11 (ref 5–15)
Anion gap: 13 (ref 5–15)
BUN: 22 mg/dL (ref 8–23)
BUN: 24 mg/dL — ABNORMAL HIGH (ref 8–23)
BUN: 28 mg/dL — ABNORMAL HIGH (ref 8–23)
CO2: 15 mmol/L — ABNORMAL LOW (ref 22–32)
CO2: 17 mmol/L — ABNORMAL LOW (ref 22–32)
CO2: 21 mmol/L — ABNORMAL LOW (ref 22–32)
Calcium: 7.6 mg/dL — ABNORMAL LOW (ref 8.9–10.3)
Calcium: 8 mg/dL — ABNORMAL LOW (ref 8.9–10.3)
Calcium: 8.1 mg/dL — ABNORMAL LOW (ref 8.9–10.3)
Chloride: 122 mmol/L — ABNORMAL HIGH (ref 98–111)
Chloride: 124 mmol/L — ABNORMAL HIGH (ref 98–111)
Chloride: 127 mmol/L — ABNORMAL HIGH (ref 98–111)
Creatinine, Ser: 0.75 mg/dL (ref 0.44–1.00)
Creatinine, Ser: 0.75 mg/dL (ref 0.44–1.00)
Creatinine, Ser: 0.84 mg/dL (ref 0.44–1.00)
GFR calc Af Amer: >60 mL/min (ref >60)
GFR calc Af Amer: >60 mL/min (ref >60)
GFR calc Af Amer: >60 mL/min (ref >60)
GFR calc non Af Amer: 59 mL/min — ABNORMAL LOW (ref >60)
GFR calc non Af Amer: >60 mL/min (ref >60)
GFR calc non Af Amer: >60 mL/min (ref >60)
Glucose, Bld: 187 mg/dL — ABNORMAL HIGH (ref 70–99)
Glucose, Bld: 194 mg/dL — ABNORMAL HIGH (ref 70–99)
Glucose, Bld: 237 mg/dL — ABNORMAL HIGH (ref 70–99)
Potassium: 3.4 mmol/L — ABNORMAL LOW (ref 3.5–5.1)
Potassium: 3.8 mmol/L (ref 3.5–5.1)
Potassium: 4 mmol/L (ref 3.5–5.1)
Sodium: 150 mmol/L — ABNORMAL HIGH (ref 135–145)
Sodium: 152 mmol/L — ABNORMAL HIGH (ref 135–145)
Sodium: 158 mmol/L — ABNORMAL HIGH (ref 135–145)

## 2019-09-04 LAB — LACTIC ACID, PLASMA: Lactic Acid, Venous: 4.3 mmol/L (ref 0.5–1.9)

## 2019-09-04 LAB — URINE CULTURE

## 2019-09-04 LAB — GLUCOSE, CAPILLARY
Glucose-Capillary: 189 mg/dL — ABNORMAL HIGH (ref 70–99)
Glucose-Capillary: 203 mg/dL — ABNORMAL HIGH (ref 70–99)
Glucose-Capillary: 178 mg/dL — ABNORMAL HIGH (ref 70–99)

## 2019-09-04 LAB — MRSA PCR SCREENING: MRSA by PCR: NEGATIVE

## 2019-09-04 MED ORDER — SODIUM CHLORIDE 0.45 % IV SOLN: INTRAVENOUS | Status: DC

## 2019-09-04 MED ORDER — SODIUM CHLORIDE 0.9 % IV BOLUS
1000.0000 mL | Freq: Once | INTRAVENOUS | Status: AC
  Administered 2019-09-04: 21:00:00 1000 mL via INTRAVENOUS

## 2019-09-04 MED ORDER — DEXTROSE 5 % AND 0.45 % NACL IV BOLUS: 1000.0000 mL | Freq: Once | INTRAVENOUS | Status: DC

## 2019-09-04 MED ORDER — INSULIN ASPART 100 UNIT/ML ~~LOC~~ SOLN: 0.0000 [IU] | Freq: Every day | SUBCUTANEOUS | Status: DC

## 2019-09-04 MED ORDER — ENOXAPARIN SODIUM 40 MG/0.4ML ~~LOC~~ SOLN
40.0000 mg | Freq: Every day | SUBCUTANEOUS | Status: DC
  Administered 2019-09-04 – 2019-09-06 (×3): 40 mg via SUBCUTANEOUS
  Filled 2019-09-04 (×3): qty 0.4

## 2019-09-04 MED ORDER — VANCOMYCIN HCL 10 G IV SOLR
1250.0000 mg | INTRAVENOUS | Status: DC
  Administered 2019-09-06: 1250 mg via INTRAVENOUS
  Filled 2019-09-04: qty 1250

## 2019-09-04 MED ORDER — DEXTROSE 5 % IV SOLN
INTRAVENOUS | Status: DC
  Administered 2019-09-04 – 2019-09-05 (×2): via INTRAVENOUS

## 2019-09-04 MED ORDER — INSULIN ASPART 100 UNIT/ML ~~LOC~~ SOLN
0.0000 [IU] | Freq: Three times a day (TID) | SUBCUTANEOUS | Status: DC
  Administered 2019-09-05 (×2): 1 [IU] via SUBCUTANEOUS
  Administered 2019-09-05 – 2019-09-06 (×3): 2 [IU] via SUBCUTANEOUS

## 2019-09-04 NOTE — Evaluation (Signed)
Physical Therapy Evaluation Patient Details Name: IDALEE SCHRAY MRN: [id number] DOB: 1922/07/02 Today's Date: 09/04/2019   History of Present Illness  Pt is a 85 yo female s/p hyponatremia, UTI and failure to thrive.  Pt recent L hip fx and fixation 07/2019. PMHx: CKD, HTN, CVA.    Clinical Impression  Pt has been bed bound for about a month per chart. She has contractures in both knees and pain with passive movement of right leg.  Pt has skin breakdown on legs - WOULD BENEFIT FROM PRAFOs/feet and heel protectors for reducing pressure on feet/ankles.  Pt did not participate in PT eval.  At this point - pt would be total care and need lift equipment for OOB.  Pt will need SNF level care but doesn't have any skilled PT needs at this time.  PT to sign off at this time and nursing can provide PROM and OOB with lift as needed.    Follow Up Recommendations SNF;Supervision/Assistance - 24 hour(pt needs SNF care but has no PT skilled needs)    Equipment Recommendations  None recommended by PT    Recommendations for Other Services       Precautions / Restrictions Precautions Precautions: Fall Precaution Comments: skin breakdown - high priority Restrictions Weight Bearing Restrictions: No      Mobility  Bed Mobility Overal bed mobility: Needs Assistance Bed Mobility: Rolling Rolling: Total assist;+2 for physical assistance         General bed mobility comments: Pt rolling side to side, but pt with contractures in BLEs and stiff.  pt not particiipating with activity - even resisting it  Transfers                 General transfer comment: pt to get OOB with lift only  Ambulation/Gait             General Gait Details: unable  Stairs            Wheelchair Mobility    Modified Rankin (Stroke Patients Only)       Balance Overall balance assessment: (didnt sit pt up due to leg contractures)                                            Pertinent Vitals/Pain Pain Assessment: Faces Faces Pain Scale: Hurts whole lot Pain Location: right leg with PROM Pain Descriptors / Indicators: Grimacing;Guarding Pain Intervention(s): Limited activity within patient's tolerance;Monitored during session;Repositioned    Home Living Family/patient expects to be discharged to:: Skilled nursing facility     Type of Home: [place]           Additional Comments: Pt is a poor historian, no family at bedside for additional information.    Prior Function Level of Independence: Needs assistance         Comments: pt has been bed bound since her  hip surgery around labor day     Hand Dominance   Dominant Hand: Right    Extremity/Trunk Assessment   Upper Extremity Assessment Upper Extremity Assessment: Defer to OT evaluation RUE Deficits / Details: 2-/5 MM grade and able to stretch through full elbow extension RUE Coordination: decreased fine motor;decreased gross motor LUE Deficits / Details: 2-/5 MM grade and able to stretch through full elbow extension; L wrist swelling - AAROM WFLs LUE Coordination: decreased fine motor;decreased gross motor  Lower Extremity Assessment Lower Extremity Assessment: RLE deficits/detail;LLE deficits/detail RLE Deficits / Details: tested in supine - knee extension to -30 at best with PROM LLE Deficits / Details: tested in supine - knee extension to -70 at best with PROM       Communication   Communication: Expressive difficulties  Cognition Arousal/Alertness: Awake/alert Behavior During Therapy: Agitated Overall Cognitive Status: Difficult to assess                                 General Comments: Pt yelling profanity when in pain with movements in right LE      General Comments General comments (skin integrity, edema, etc.): L wrist swelling, but movement noted and does not appear sore. Elevated upon leaving.    Exercises Other  Exercises Other Exercises: PROM to both legs as pt tolerates - pt with pain when moving right leg   Assessment/Plan    PT Assessment Patent does not need any further PT services  PT Problem List         PT Treatment Interventions      PT Goals (Current goals can be found in the Care Plan section)  Acute Rehab PT Goals Patient Stated Goal: none stated PT Goal Formulation: Patient unable to participate in goal setting Potential to Achieve Goals: Poor    Frequency     Barriers to discharge        Co-evaluation PT/OT/SLP Co-Evaluation/Treatment: Yes Reason for Co-Treatment: For patient/therapist safety;Necessary to address cognition/behavior during functional activity;Complexity of the patient's impairments (multi-system involvement) PT goals addressed during session: Mobility/safety with mobility OT goals addressed during session: ADL's and self-care       AM-PAC PT "6 Clicks" Mobility  Outcome Measure Help needed turning from your back to your side while in a flat bed without using bedrails?: Total Help needed moving from lying on your back to sitting on the side of a flat bed without using bedrails?: Total Help needed moving to and from a bed to a chair (including a wheelchair)?: Total Help needed standing up from a chair using your arms (e.g., wheelchair or bedside chair)?: Total Help needed to walk in hospital room?: Total Help needed climbing 3-5 steps with a railing? : Total 6 Click Score: 6    End of Session   Activity Tolerance: Patient limited by pain;Treatment limited secondary to medical complications (Comment) Patient left: in bed;with call bell/phone within reach;with bed alarm set Nurse Communication: Mobility status;Need for lift equipment PT Visit Diagnosis: Adult, failure to thrive (R62.7)    Time: 0855-0920 PT Time Calculation (min) (ACUTE ONLY): 25 min   Charges:   PT Evaluation $PT Eval Low Complexity: 1 Low         09/04/2019   Rande Lawman, PT   Loyal Buba 09/04/2019, 12:58 PM

## 2019-09-04 NOTE — Progress Notes (Signed)
  Date: 09/04/2019  Patient name: Victoria Lloyd  Medical record number: [id number]  Date of birth: 11/01/1922        I have seen and evaluated this patient and I have discussed the plan of care with the house staff. Please see their note for complete details. I concur with their findings.   Please seem my attested H&P from earlier today. Sodium continues to improve, but noted to have worsening metabolic acidosis this afternoon. BP is low. Lactic acid elevated at 4.3. She is already on broad spectrum antibiotics for possible UTI and cellulitis. I have ordered 1L NS bolus. Repeat if needed to maintain MAP > 65. Repeat LA with morning labs.   Velna Ochs, MD 09/04/2019, 7:53 PM

## 2019-09-04 NOTE — Evaluation (Signed)
Occupational Therapy Evaluation Patient Details Name: Victoria Lloyd MRN: [id number] DOB: 1922/08/25 Today's Date: 09/04/2019    History of Present Illness Pt is a 85 yo female s/p hyponatremia, UTI and failure to thrive.  Pt recent L hip fx and fixation 07/2019. PMHx: CKD, HTN, CVA.   Clinical Impression   Pt PTA: recently from SNF s/p fall and hip sx, now pt FTT. Pt currently limited by limited eye opening; pt following 75% of commands and pt yelling profanity when in pain with movements in BUEs/BLEs. Nearly [place] for all ADL. Pt with BLE contractures with reduced knee extension. Pt's BUEs 2-/5 MM grade and able to stretch through full elbow extension. Pt's L wrist with swelling noted, AAROM is WFLs. Pt unable to perform progressive mobility as pt only able to roll side to side due to pain and intense facial grimmacing. Pt does not require continued OT skilled services in this setting- all care can be accomplished in SNF setting. OT signing off.    Follow Up Recommendations  SNF;Supervision/Assistance - 24 hour    Equipment Recommendations  None recommended by OT    Recommendations for Other Services       Precautions / Restrictions Precautions Precautions: Fall Precaution Comments: skin breakdown - high priority Restrictions Weight Bearing Restrictions: No      Mobility Bed Mobility Overal bed mobility: Needs Assistance Bed Mobility: Rolling Rolling: Total assist;+2 for physical assistance         General bed mobility comments: Pt rolling side to side, but pt with contractures in BLEs too stiff and no therapeutic value to sitting up if pt is in too pain  Transfers                 General transfer comment: deferred- staff would need maximove    Balance                                           ADL either performed or assessed with clinical judgement   ADL Overall ADL's : Needs assistance/impaired Eating/Feeding: Total assistance    Grooming: Total assistance   Upper Body Bathing: Maximal assistance;Total assistance   Lower Body Bathing: Total assistance   Upper Body Dressing : Maximal assistance;Total assistance   Lower Body Dressing: Total assistance   Toilet Transfer: Total assistance;+2 for physical assistance;+2 for safety/equipment Toilet Transfer Details (indicate cue type and reason): using lift Toileting- Clothing Manipulation and Hygiene: Total assistance;+2 for physical assistance;+2 for safety/equipment       Functional mobility during ADLs: Total assistance;+2 for physical assistance;+2 for safety/equipment General ADL Comments: Nearly totalA for all ADL. pt limited by contractures in BLEs.     Vision   Vision Assessment?: Vision impaired- to be further tested in functional context;Yes Eye Alignment: Within Functional Limits Ocular Range of Motion: Within Functional Limits Alignment/Gaze Preference: Within Defined Limits Tracking/Visual Pursuits: Able to track stimulus in all quads without difficulty Additional Comments: Pt able to scan therapist in room with eyes and to turn head.     Perception     Praxis      Pertinent Vitals/Pain Pain Assessment: Faces Faces Pain Scale: Hurts whole lot Pain Location: right leg with PROM Pain Descriptors / Indicators: Grimacing;Guarding Pain Intervention(s): Limited activity within patient's tolerance;Monitored during session;Repositioned     Hand Dominance Right   Extremity/Trunk Assessment Upper Extremity Assessment Upper Extremity Assessment: Defer to  OT evaluation RUE Deficits / Details: 2-/5 MM grade and able to stretch through full elbow extension RUE Coordination: decreased fine motor;decreased gross motor LUE Deficits / Details: 2-/5 MM grade and able to stretch through full elbow extension; L wrist swelling - AAROM WFLs LUE Coordination: decreased fine motor;decreased gross motor   Lower Extremity Assessment Lower Extremity  Assessment: RLE deficits/detail;LLE deficits/detail RLE Deficits / Details: tested in supine - knee extension to -30 at best with PROM LLE Deficits / Details: tested in supine - knee extension to -70 at best with PROM       Communication Communication Communication: Expressive difficulties   Cognition Arousal/Alertness: Awake/alert Behavior During Therapy: Agitated Overall Cognitive Status: Difficult to assess                                 General Comments: Pt yelling profanity when in pain with movements in right LE   General Comments  L wrist swelling, but movement noted and does not appear sore. Elevated upon leaving.    Exercises Exercises: Other exercises Other Exercises Other Exercises: BUE AAROM shoulder through digits   Shoulder Instructions      Home Living Family/patient expects to be discharged to:: Skilled nursing facility     Type of Home: [place]                           Additional Comments: Pt is a poor historian, no family at bedside for additional information.      Prior Functioning/Environment Level of Independence: Needs assistance        Comments: pt has been bed bound since her  hip surgery around labor day        OT Problem List: Decreased strength;Decreased activity tolerance;Impaired balance (sitting and/or standing);Decreased cognition;Decreased safety awareness;Pain      OT Treatment/Interventions:      OT Goals(Current goals can be found in the care plan section) Acute Rehab OT Goals Patient Stated Goal: none stated OT Goal Formulation: With patient Time For Goal Achievement: 09/18/19 Potential to Achieve Goals: Good  OT Frequency:     Barriers to D/C:            Co-evaluation   Reason for Co-Treatment: For patient/therapist safety;Necessary to address cognition/behavior during functional activity;Complexity of the patient's impairments (multi-system involvement) PT goals addressed  during session: Mobility/safety with mobility OT goals addressed during session: ADL's and self-care      AM-PAC OT "6 Clicks" Daily Activity     Outcome Measure Help from another person eating meals?: Total Help from another person taking care of personal grooming?: A Lot Help from another person toileting, which includes using toliet, bedpan, or urinal?: Total Help from another person bathing (including washing, rinsing, drying)?: Total Help from another person to put on and taking off regular upper body clothing?: Total Help from another person to put on and taking off regular lower body clothing?: Total 6 Click Score: 7   End of Session Nurse Communication: Mobility status  Activity Tolerance: Patient limited by pain Patient left: in bed;with call bell/phone within reach;with bed alarm set  OT Visit Diagnosis: Unsteadiness on feet (R26.81);Muscle weakness (generalized) (M62.81);Adult, failure to thrive (R62.7)                [id number] OT Time Calculation (min): 33 min Charges:  OT General Charges $OT Visit: 1 Visit OT Evaluation $  OT Eval Moderate Complexity: 1 Mod  Darryl Nestle) Marsa Aris OTR/L Acute Rehabilitation Services Pager: [phone] Office: [place] 09/04/2019, 12:55 PM

## 2019-09-04 NOTE — Progress Notes (Signed)
Upon admission, multiple bruises pressure injuries were noted, also skin tear and blister. Surgical wound on left hip intact, no drainage and closed wound, dressing reinforced. Wound and foam dressings were applied. Wound care consult ordered. Repositioning the pt every 2 hours and monitoring sites.   Robin Searing 09/04/19

## 2019-09-04 NOTE — Progress Notes (Signed)
Pharmacy Antibiotic Note  Victoria Lloyd is a 85 y.o. female admitted on 09/03/2019 with failure to thrive.  Pharmacy has been consulted for vancomycin dosing for cellulitis.  She is also on Rocephin.  WBC down to 9.2, afebrile, renal function improving (Scr 0.69 today, CrCl 33.1 ml/min). Will adjust vancomycin dose based on improving renal function.  Plan: Increase vancomycin to 1250 mg IV Q48 hrs for AUC 545 using Scr 0.8 CTX 1gm IV Q24H per MD Monitor renal fxn, clinical progress, vanc AUC as indicated  Height: 5\' 3"  (160 cm) Weight: 110 lb (49.9 kg) IBW/kg (Calculated) : 52.4  Temp (24hrs), Avg:98.1 F (36.7 C), Min:97.7 F (36.5 C), Max:98.4 F (36.9 C)  Recent Labs  Lab 09/03/19 1325 09/03/19 1458 09/03/19 1801 09/04/19 0019 09/04/19 0755  WBC 10.6*  --   --  9.2  --   CREATININE 1.06*  --  0.89 0.84 0.69  LATICACIDVEN 2.4* 2.2*  --   --   --     Estimated Creatinine Clearance: 33.1 mL/min (by C-G formula based on SCr of 0.69 mg/dL).    Allergies  Allergen Reactions  . Codeine Other (See Comments)    Unknown; patient cannot recall the reaction  . Sulfa Antibiotics Other (See Comments)    Unknown; patient cannot recall the reaction  . Lactose Intolerance (Gi) Nausea And Vomiting  . Milk-Related Compounds Nausea And Vomiting  . Other Other (See Comments)    "Seafood"- unknown reaction, listed on MAR.   Vanc 10/10 >> CTX 10/10 >>  10/10 covid - negative 10/10 BCx - sent  Richardine Service, PharmD PGY1 Pharmacy Resident Phone: [phone] 09/04/2019  11:17 AM  Please check [email or web address] for unit-specific pharmacy phone numbers.

## 2019-09-04 NOTE — Evaluation (Signed)
Clinical/Bedside Swallow Evaluation Patient Details  Name: Victoria Lloyd MRN: [id number] Date of Birth: 1922/07/30  Today's Date: 09/04/2019 Time: SLP Start Time (ACUTE ONLY): 0815 SLP Stop Time (ACUTE ONLY): 0840 SLP Time Calculation (min) (ACUTE ONLY): 25 min  Past Medical History:  Past Medical History:  Diagnosis Date  . Chronic diastolic (congestive) heart failure ([place])   . CKD (chronic kidney disease), stage IV ([place])   . Complete heart block ([facility])    a. s/p MDT dual chamber PPM   . Hypertension   . Stroke ([place]) 02/01/2018   CT scan =>  Chronic right PCA distribution infarct with right occipital lobe   Past Surgical History:  Past Surgical History:  Procedure Laterality Date  . ANTERIOR APPROACH HEMI HIP ARTHROPLASTY Left 08/01/2019   Procedure: ANTERIOR APPROACH HEMI HIP ARTHROPLASTY;  Surgeon: Rod Can, MD;  Location: [facility];  Service: Orthopedics;  Laterality: Left;  . CATARACT EXTRACTION    . PERMANENT PACEMAKER INSERTION N/A 08/04/2014   Procedure: PERMANENT PACEMAKER INSERTION;  Surgeon: Deboraha Sprang, MD;  Location: [facility] CATH LAB;  Service: Cardiovascular;  Laterality: N/A;   HPI:  85 yo female who is presenting today for altered mental status. History is obtained from chart review.  She had a hip fracture that occured over labor day.  She has been been refusing all care since that time. She has been bed bound since that time as well and has, overall, developed a failure to thrive. Today, it is noted that she developed dyspnea.  Upon arrival to ED, pt found to be hypernatremic--161 and have UTI. Per son's report, patient has had declining mentation since the surgery. She really isn't eating much.   Most recent chest xray was showing no acute cardiopulmonary abnormality.   Assessment / Plan / Recommendation Clinical Impression  Clinical swallowing evaluation was completed using thin liquids via spoon, cup and straw, pureed material, dual textured solids and dry  solids.  Of note, the patient is known to [place] service from previous admission with clinical swallowing evaluation completed 07/29/2019 with recommendation for a dysphagia 2 diet with thin liquids.  Cranial nerve exam was attempted and unable to be fully completed due to the patient's difficulty following commands. Lingual, facial, labial and jaw range of motion appeared to be adequate.  Strength was unable to be assessed.  Clinically the patient presented similar to exam in early Semptember and appears to have a cognitive based oral dysphagia characterized by mild oral holding of boluses.  In addition, she did appear to be slow orally with mild diffuse oral residue seen given dry solids.  Less residue was seen given dual textured solids and offered liquid wash appeared to assist with clearance of the oral cavity.  Swallow trigger was appreciated to palpation and overt s/s of aspiration were not seen.  Recommend begin a dysphagia 3 diet with thin liquids.  The patient will need full assistance for all intake.  ST will follow up for therapeutic diet tolerance during acute admission.   SLP Visit Diagnosis: Dysphagia, unspecified (R13.10)    Aspiration Risk  No limitations    Diet Recommendation   Dysphagia 3 with thin liquids  Medication Administration: Whole meds with liquid    Other  Recommendations Oral Care Recommendations: Oral care BID   Follow up Recommendations Other (comment)(TBD)      Frequency and Duration min 2x/week  2 weeks       Prognosis Prognosis for Safe Diet Advancement: Fair Barriers to Reach Goals:  Cognitive deficits      Swallow Study   General Date of Onset: 09/03/19 HPI: 85 yo female who is presenting today for altered mental status. History is obtained from chart review.  She had a hip fracture that occured over labor day.  She has been been refusing all care since that time. She has been bed bound since that time as well and has, overall, developed a failure to  thrive. Today, it is noted that she developed dyspnea.  Upon arrival to ED, pt found to be hypernatremic--161 and have UTI. Per son's report, patient has had declining mentation since the surgery. She really isn't eating much.   Most recent chest xray was showing no acute cardiopulmonary abnormality. Type of Study: Bedside Swallow Evaluation Previous Swallow Assessment: 07/29/2019 with recommendation for a dysphagia 2 diet with thin liquids Diet Prior to this Study: NPO Temperature Spikes Noted: No Respiratory Status: Room air History of Recent Intubation: No Behavior/Cognition: Alert;Cooperative;Confused;Requires cueing;Doesn't follow directions Oral Cavity Assessment: Dry Oral Care Completed by SLP: Yes Oral Cavity - Dentition: Adequate natural dentition Vision: Functional for self-feeding Self-Feeding Abilities: Total assist Patient Positioning: Upright in bed Baseline Vocal Quality: Not observed Volitional Cough: Cognitively unable to elicit Volitional Swallow: Unable to elicit    Oral/Motor/Sensory Function Overall Oral Motor/Sensory Function: Other (comment)(could not fully assess)   Ice Chips Ice chips: Impaired Presentation: Spoon Oral Phase Impairments: Poor awareness of bolus Oral Phase Functional Implications: Oral residue Pharyngeal Phase Impairments: Unable to trigger swallow   Thin Liquid Thin Liquid: Within functional limits Presentation: Cup;Spoon;Straw    Nectar Thick Nectar Thick Liquid: Not tested   Honey Thick Honey Thick Liquid: Not tested   Puree Puree: Impaired Presentation: Spoon Oral Phase Impairments: Poor awareness of bolus Oral Phase Functional Implications: Oral holding   Solid     Solid: Impaired Presentation: Spoon Oral Phase Impairments: Impaired mastication;Poor awareness of bolus Oral Phase Functional Implications: Oral residue;Oral holding;Prolonged oral transit     [name], MA, CCC-SLP Acute Rehab SLP [id number]  Lamar Sprinkles 09/04/2019,8:53 AM

## 2019-09-04 NOTE — Progress Notes (Signed)
NAME:  Victoria Lloyd, MRN:  [id number], DOB:  1922/03/05, LOS: 1 ADMISSION DATE:  09/03/2019, Primary: Patient, No Pcp Per  CHIEF COMPLAINT: failure to thrive  Medical Service: Internal Medicine Teaching Service         Attending Physician: Dr. Velna Ochs, MD    First Contact: Dr. Darrick Meigs Pager: [phone]  Second Contact: Dr. Sharon Seller Pager: [phone]       After Hours (After 5p/  First Contact Pager: [phone]  weekends / holidays): Second Contact Pager: [phone]    Brief History  85 yo female with recent fall resulting in hip fx s/p fixation in September who presented to ED with findings consistent with failure to thrive and severe hypernatremia  Subjective  Non verbal. Awake though.  Objective   Blood pressure (!) 107/55, pulse 70, temperature 98.4 F (36.9 C), temperature source Oral, resp. rate 16, height 5\' 3"  (1.6 m), weight 48.1 kg, SpO2 100 %.     Intake/Output Summary (Last 24 hours) at 09/04/2019 0522 Last data filed at 09/04/2019 0358 Gross per 24 hour  Intake 520.06 ml  Output 0 ml  Net 520.06 ml   Filed Weights   09/03/19 1800 09/03/19 2031  Weight: 51.5 kg 48.1 kg    Examination: GENERAL: in no acute distress. Cachetic appearing. CARDIAC: heart RRR.  PULMONARY: lung sounds clear ABDOMEN: soft. Nontender to palpation.  Nondistended.  NEURO: non verbal SKIN: several well dressed pressure wounds.  Consults:  hospice  Micro Data:  10/10 UC>> 10/10 BC>>  Antimicrobials:  Rocephin 10/10>> Vancomycin 10/10>>  Labs   CBC: Recent Labs  Lab 09/03/19 1325 09/04/19 0019  WBC 10.6* 9.2  NEUTROABS 9.1* 7.4  HGB 13.6 11.4*  HCT 45.1 39.6  MCV 96.2 97.1  PLT [id number]    Basic Metabolic Panel: Recent Labs  Lab 09/03/19 1325 09/03/19 1801 09/04/19 0019  NA 161* 161* 158*  K 3.6 3.7 3.4*  CL 128* 130* 127*  CO2 22 22 21*  GLUCOSE 116* 110* 187*  BUN 30* 28* 28*  CREATININE 1.06* 0.89 0.84  CALCIUM 8.7* 8.1* 8.0*  MG  --  2.1   --   PHOS  --  2.7  --     Stable/resolved problems   Dementia. Continue Risperdal. Diastolic heart failure. Does not appear to be in exacerbation at this time. Continue daily weights. Strict I/o HTN: hold antihypertensives.  Summary  85 yo female who developed failure thrive following hip fixation 2/2 fall and hip fracture in September. Found to be severely hypernatremic upon arrival to ED and was subsequently admitted for management  Assessment & Plan:  Active Problems:   UTI (urinary tract infection)   Altered mental status   Pressure injury of skin   Failure to thrive in adult   Hypernatremia  Hypernatremia 161 on admission. Likely in setting of failure to thrive and poor po intake. 1.3L free water deficit.  Slowly improving--158 this morning Plan Tele monitor Continue D5 IVF Sodium checks q6h  Failure to thrive PT/OT.  Encourage nutrition with dysphagia 3 diet Will likely need PEG down the road if remaining full code and refusing po  Pressure ulcers 2/2 failure to thrive. Present on right knee, right lower leg, right heel, right medial foot, left foot.  Plan Wound care consult. Appreciate their recs on dressing changes Turn q2h Vancomycin for cellulitis  UTI/ Urinary retention. New since hip fx over labor day. Has had foley in since that time for urinary retention. Now has UTI  as found on UA. Noted to aneuric overnight with minimal findings on bladder scan. Likely 2/2 dehydration Plan Rocephin for UTI Remove foley. Allow voiding trial. Bladder scan q4h. Straight cath as needed for retention. flomax for retention Repeat cbc in am    Best practice:  CODE STATUS: full Diet: dysphagia 3 diet Pain management: tylenol DVT for prophylaxis: lovenox Social considerations/Family communication: pt's son, [name], traveled here from [place] last night and should be available for questions. Dispo: hospice on monday   Ladarrian Asencio, MD [place]  PGY-1 PAGER #: [phone] 09/04/19 5:22 AM

## 2019-09-04 NOTE — Progress Notes (Signed)
0400 bladder scan showed 28ml of urine. Pt has not voided since admission at 2100. Dr. Madilyn Fireman paged and stated dehydration was probable cause. Will continue to monitor and bladder scan routinely and as needed.   Robin Searing 09/04/19

## 2019-09-05 ENCOUNTER — Inpatient Hospital Stay: Payer: Self-pay

## 2019-09-05 DIAGNOSIS — L03115 Cellulitis of right lower limb: Secondary | ICD-10-CM

## 2019-09-05 DIAGNOSIS — Z79899 Other long term (current) drug therapy: Secondary | ICD-10-CM

## 2019-09-05 DIAGNOSIS — E872 Acidosis: Secondary | ICD-10-CM

## 2019-09-05 DIAGNOSIS — I959 Hypotension, unspecified: Secondary | ICD-10-CM

## 2019-09-05 DIAGNOSIS — I110 Hypertensive heart disease with heart failure: Secondary | ICD-10-CM

## 2019-09-05 LAB — BASIC METABOLIC PANEL
Anion gap: 10 (ref 5–15)
BUN: 12 mg/dL (ref 8–23)
CO2: 18 mmol/L — ABNORMAL LOW (ref 22–32)
Calcium: 6.9 mg/dL — ABNORMAL LOW (ref 8.9–10.3)
Chloride: 115 mmol/L — ABNORMAL HIGH (ref 98–111)
Creatinine, Ser: 0.46 mg/dL (ref 0.44–1.00)
GFR calc Af Amer: >60 mL/min (ref >60)
GFR calc non Af Amer: >60 mL/min (ref >60)
Glucose, Bld: 127 mg/dL — ABNORMAL HIGH (ref 70–99)
Potassium: 2.9 mmol/L — ABNORMAL LOW (ref 3.5–5.1)
Sodium: 143 mmol/L (ref 135–145)

## 2019-09-05 LAB — CBC
HCT: 29.8 % — ABNORMAL LOW (ref 36.0–46.0)
Hemoglobin: 9.2 g/dL — ABNORMAL LOW (ref 12.0–15.0)
MCH: 28.9 pg (ref 26.0–34.0)
MCHC: 30.9 g/dL (ref 30.0–36.0)
MCV: 93.7 fL (ref 80.0–100.0)
Platelets: 142 10*3/uL — ABNORMAL LOW (ref 150–400)
RBC: 3.18 MIL/uL — ABNORMAL LOW (ref 3.87–5.11)
RDW: 16.2 % — ABNORMAL HIGH (ref 11.5–15.5)
WBC: 9.3 10*3/uL (ref 4.0–10.5)
nRBC: 0.2 % (ref 0.0–0.2)

## 2019-09-05 LAB — COMPREHENSIVE METABOLIC PANEL
ALT: 30 U/L (ref 0–44)
AST: 40 U/L (ref 15–41)
Albumin: 1.3 g/dL — ABNORMAL LOW (ref 3.5–5.0)
Alkaline Phosphatase: 80 U/L (ref 38–126)
Anion gap: 8 (ref 5–15)
BUN: 13 mg/dL (ref 8–23)
CO2: 19 mmol/L — ABNORMAL LOW (ref 22–32)
Calcium: 7 mg/dL — ABNORMAL LOW (ref 8.9–10.3)
Chloride: 115 mmol/L — ABNORMAL HIGH (ref 98–111)
Creatinine, Ser: 0.66 mg/dL (ref 0.44–1.00)
GFR calc Af Amer: >60 mL/min (ref >60)
GFR calc non Af Amer: >60 mL/min (ref >60)
Glucose, Bld: 159 mg/dL — ABNORMAL HIGH (ref 70–99)
Potassium: 2.7 mmol/L — CL (ref 3.5–5.1)
Sodium: 142 mmol/L (ref 135–145)
Total Bilirubin: 0.7 mg/dL (ref 0.3–1.2)
Total Protein: 4.1 g/dL — ABNORMAL LOW (ref 6.5–8.1)

## 2019-09-05 LAB — GLUCOSE, CAPILLARY
Glucose-Capillary: 121 mg/dL — ABNORMAL HIGH (ref 70–99)
Glucose-Capillary: 142 mg/dL — ABNORMAL HIGH (ref 70–99)
Glucose-Capillary: 158 mg/dL — ABNORMAL HIGH (ref 70–99)
Glucose-Capillary: 62 mg/dL — ABNORMAL LOW (ref 70–99)
Glucose-Capillary: 76 mg/dL (ref 70–99)
Glucose-Capillary: 77 mg/dL (ref 70–99)

## 2019-09-05 LAB — LACTIC ACID, PLASMA
Lactic Acid, Venous: 3.2 mmol/L (ref 0.5–1.9)
Lactic Acid, Venous: 2.8 mmol/L (ref 0.5–1.9)

## 2019-09-05 LAB — MAGNESIUM: Magnesium: 1.5 mg/dL — ABNORMAL LOW (ref 1.7–2.4)

## 2019-09-05 LAB — URINE CULTURE: Culture: <10000 — AB

## 2019-09-05 MED ORDER — SODIUM CHLORIDE 0.9 % IV BOLUS
500.0000 mL | Freq: Once | INTRAVENOUS | Status: AC
  Administered 2019-09-05: 06:00:00 500 mL via INTRAVENOUS

## 2019-09-05 MED ORDER — SODIUM CHLORIDE 0.9% FLUSH: 10.0000 mL | INTRAVENOUS | Status: DC | PRN

## 2019-09-05 MED ORDER — SODIUM CHLORIDE 0.45 % IV SOLN
INTRAVENOUS | Status: DC
  Administered 2019-09-05: 17:00:00 via INTRAVENOUS

## 2019-09-05 MED ORDER — DEXTROSE 50 % IV SOLN
INTRAVENOUS | Status: AC
  Filled 2019-09-05: qty 50

## 2019-09-05 MED ORDER — SODIUM CHLORIDE 0.9 % IV BOLUS
1000.0000 mL | Freq: Once | INTRAVENOUS | Status: AC
  Administered 2019-09-05: 11:00:00 1000 mL via INTRAVENOUS

## 2019-09-05 MED ORDER — CHLORHEXIDINE GLUCONATE CLOTH 2 % EX PADS
6.0000 | MEDICATED_PAD | Freq: Every day | CUTANEOUS | Status: DC
  Administered 2019-09-05 – 2019-09-07 (×3): 6 via TOPICAL

## 2019-09-05 MED ORDER — POTASSIUM CHLORIDE 20 MEQ PO PACK: 40.0000 meq | PACK | Freq: Once | ORAL | Status: DC

## 2019-09-05 MED ORDER — DEXTROSE-NACL 5-0.45 % IV SOLN
INTRAVENOUS | Status: DC
  Administered 2019-09-06 – 2019-09-07 (×2): via INTRAVENOUS

## 2019-09-05 MED ORDER — KCL IN DEXTROSE-NACL 40-5-0.45 MEQ/L-%-% IV SOLN
INTRAVENOUS | Status: DC
  Administered 2019-09-05: 15:00:00 via INTRAVENOUS
  Filled 2019-09-05: qty 1000

## 2019-09-05 MED ORDER — MAGNESIUM SULFATE 2 GM/50ML IV SOLN
2.0000 g | Freq: Once | INTRAVENOUS | Status: AC
  Administered 2019-09-05: 2 g via INTRAVENOUS
  Filled 2019-09-05: qty 50

## 2019-09-05 MED ORDER — SODIUM CHLORIDE 0.9% FLUSH: 10.0000 mL | Freq: Two times a day (BID) | INTRAVENOUS | Status: DC

## 2019-09-05 MED ORDER — LACTATED RINGERS IV BOLUS
1000.0000 mL | Freq: Once | INTRAVENOUS | Status: AC
  Administered 2019-09-05: 1000 mL via INTRAVENOUS

## 2019-09-05 MED ORDER — POTASSIUM CHLORIDE 10 MEQ/100ML IV SOLN
10.0000 meq | INTRAVENOUS | Status: DC
  Administered 2019-09-06: 02:00:00 10 meq via INTRAVENOUS
  Filled 2019-09-05: qty 100

## 2019-09-05 MED ORDER — POTASSIUM CHLORIDE 10 MEQ/100ML IV SOLN
10.0000 meq | INTRAVENOUS | Status: AC
  Administered 2019-09-05 (×4): 10 meq via INTRAVENOUS
  Filled 2019-09-05: qty 100

## 2019-09-05 MED ORDER — PRO-STAT SUGAR FREE PO LIQD
30.0000 mL | Freq: Two times a day (BID) | ORAL | Status: DC
  Administered 2019-09-05 – 2019-09-07 (×4): 30 mL via ORAL
  Filled 2019-09-05 (×3): qty 30

## 2019-09-05 MED ORDER — KCL IN DEXTROSE-NACL 40-5-0.45 MEQ/L-%-% IV SOLN
INTRAVENOUS | Status: DC
  Administered 2019-09-06: 01:00:00 via INTRAVENOUS
  Filled 2019-09-05 (×2): qty 1000

## 2019-09-05 NOTE — Progress Notes (Signed)
Initial Nutrition Assessment  DOCUMENTATION CODES:   Not applicable  INTERVENTION:   -30 ml Prostat BID, each supplement provides 100 kcals and 15 grams protein   NUTRITION DIAGNOSIS:   Increased nutrient needs related to wound healing as evidenced by estimated needs.  GOAL:   Patient will meet greater than or equal to 90% of their needs  MONITOR:   PO intake, Supplement acceptance, Labs, Weight trends, Skin, I & O's  REASON FOR ASSESSMENT:   Low Braden    ASSESSMENT:   Patient is a 85 year old female with a past medical history of hypertension, CKD stage IV, HFpEF, complete heart block with permanent pacemaker, and prior stroke presenting with poor p.o. intake, altered mental status, and failure to thrive found to have a sodium of 161 on admission.  She was recently admitted for hip fracture this past Labor Day.  She has been bedbound since that time with a significant decline in functional status.  She has been since become nonverbal in response only to yes/no questions.  She also has advanced dementia and has quit eating.  Pt admitted with AMS, hypernatremia, and failure to thrive.   10/11- s/p BSE- advanced to dysphagia 3 diet with thin liquids  Case discussed with RN in room. Pt is from ALF [facility]). Pt son has agreed to hospice/palliative care services at discharge. Per RN, pt can speak, but is selective in answers (often does not answer questions). Pt has been combative today; RN reports hitting and biting staff. Lunch tray at bedside, which was untouched. RN reports she consumed only a spoonful of two of applesauce today, but has been mainly refusing PO's and medications.   Wt hx reviewed; pt has experienced a 13.7% wt loss over the past year. While not significant for time frame, this is concerning given advanced age, poor oral intake, and multiple areas of skin breakdown.   Medications reviewed and include 0.45% soduum chloride infusion @ 75 ml/hr.    Labs reviewed: K: 2.9 (on IV supplementation), Mg: 1.5 (on IV supplementation), CBGS: 142 (inpatient orders for glycemic control are 0-5 units insulin aspart q HS, 0-9 units insulin aspart TID with meals).   Diet Order:   Diet Order            DIET DYS 3 Room service appropriate? Yes; Fluid consistency: Thin  Diet effective now              EDUCATION NEEDS:   No education needs have been identified at this time  Skin:  Skin Assessment: Skin Integrity Issues: Skin Integrity Issues:: Stage I, Stage II, Incisions, DTI DTI: rt heel, lt heel, rt and lt toes, lt shoulder, rt and lt ankles Stage I: coccyx Stage II: rt knee Incisions: rt medial pretibial, lt pretibial  Last BM:  09/04/19  Height:   Ht Readings from Last 1 Encounters:  09/03/19 5\' 3"  (1.6 m)    Weight:   Wt Readings from Last 1 Encounters:  09/05/19 50.9 kg    Ideal Body Weight:  52.3 kg  BMI:  Body mass index is 19.88 kg/m.  Estimated Nutritional Needs:   Kcal:  1500-1700  Protein:  65-80 grams  Fluid:  > 1.5 L    Munira Polson A. Jimmye Norman, RD, LDN, [place] Registered Dietitian II Certified Diabetes Care and Education Specialist Pager: [phone] After hours Pager: [phone]

## 2019-09-05 NOTE — Progress Notes (Signed)
Patient is here for failure to thrive and is non-verbal. Blood pressure is 99/50 MD notified, will continue to monitor.

## 2019-09-05 NOTE — Plan of Care (Signed)
Patient was non-verbal this morning during assessment, her eyes were closed but she will open them up when her name is called. Patient is on a low bed with her bed alarm on and bed on lowest position.

## 2019-09-05 NOTE — Progress Notes (Signed)
Bladder scan at 0400 showed 178mL. Have not irrigated since last night. Blood work not drawn per phlebotomist unable to obtain enough blood. Soft blood pressures still, noted. Spoke to Dr. Marva Panda. Ordered 548mL bolus of normal saline. Made aware of blood work not drawn at 0200. Awaiting another phlebotomist to come.  Will continue to monitor.

## 2019-09-05 NOTE — Progress Notes (Signed)
NAME:  Victoria Lloyd, MRN:  [id number], DOB:  [birth year], LOS: 2 ADMISSION DATE:  09/03/2019, Primary: Patient, No Pcp Per  CHIEF COMPLAINT: failure to thrive  Medical Service: Internal Medicine Teaching Service         Attending Physician: Dr. Velna Ochs, MD    First Contact: Dr. Darrick Meigs Pager: [phone]  Second Contact: Dr. Sharon Seller Pager: [phone]       After Hours (After 5p/  First Contact Pager: [phone]  weekends / holidays): Second Contact Pager: [phone]    Brief History  85 yo female with recent fall resulting in hip fx s/p fixation in September who presented to ED with findings consistent with failure to thrive and severe hypernatremia  Subjective  Developed hypotension overnight that is responsive to IV bolus. Patient denies pain this morning. Somewhat more alert than previously.  Objective   Blood pressure (!) 100/46, pulse 80, temperature 98.8 F (37.1 C), temperature source Oral, resp. rate 17, height 5\' 3"  (1.6 m), weight 50.9 kg, SpO2 99 %.     Intake/Output Summary (Last 24 hours) at 09/05/2019 0514 Last data filed at 09/05/2019 0400 Gross per 24 hour  Intake 1654.06 ml  Output 300 ml  Net 1354.06 ml   Filed Weights   09/03/19 2031 09/04/19 0537 09/05/19 0116  Weight: 48.1 kg 49.9 kg 50.9 kg    Examination: General: in no acute distress Heart: RRR Lungs sounds clear. Skin: several well dressed pressure ulcers. See media tab for RLE wound photo. Purulent drainage present for this site with surrounding erythema.   Consults:  hospice  Micro Data:  10/10 UC>> 10/10 BC>>NGTD  Antimicrobials:  Rocephin 10/10>> Vancomycin 10/10>>  Labs   CBC: Recent Labs  Lab 09/03/19 1325 09/04/19 0019  WBC 10.6* 9.2  NEUTROABS 9.1* 7.4  HGB 13.6 11.4*  HCT 45.1 39.6  MCV 96.2 97.1  PLT [id number]    Basic Metabolic Panel: Recent Labs  Lab 09/03/19 1801 09/04/19 0019 09/04/19 0755 09/04/19 1502 09/04/19 1913  NA 161* 158* 157* 152* 150*   K 3.7 3.4* 4.3 4.0 3.8  CL 130* 127* 129* 124* 122*  CO2 22 21* 18* 17* 15*  GLUCOSE 110* 187* 192* 237* 194*  BUN 28* 28* 26* 24* 22  CREATININE 0.89 0.84 0.69 0.75 0.75  CALCIUM 8.1* 8.0* 7.5* 7.6* 8.1*  MG 2.1  --   --   --   --   PHOS 2.7  --   --   --   --     Stable/resolved problems   Dementia. Continue Risperdal. Diastolic heart failure. Does not appear to be in exacerbation at this time. Continue daily weights. Strict I/o HTN: hold antihypertensives. Failure to thrive PT/OT.  Encourage nutrition with dysphagia 3 diet Will likely need PEG down the road if remaining full code and refusing po  Summary  85 yo female who developed failure thrive following hip fixation 2/2 fall and hip fracture in September. Found to be severely hypernatremic upon arrival to ED and was subsequently admitted for management  Assessment & Plan:  Active Problems:   Lower urinary tract infectious disease   Altered mental status   Pressure injury of skin   Failure to thrive in adult   Hypernatremia  Lactic acidosis. Bicarb noted to be slowly decreasing. LA 2.2-->4.3 as of last night. Mildly hypotensive and responding to fluid bolus. Still afebrile. 10/10 blood cultures NGTD. Pt already on vanc and rocephin for cellulitis and UTI respectively. Midline placed for lab  draws. Repeat am labs still not drawn. Discussed with lab and nursing staff to get this done soon. Plan Repeat LA this morning Repeat CBC May need another bolus if blood pressure trends down again but will have to monitor for fluid overload.  Hypernatremia--improving 161 on admission. Likely in setting of failure to thrive and poor po intake. 1.3L free water deficit.  150 last night.  Repeat am labs still not drawn. Discussed with lab and nursing staff to get this done soon. Plan Tele monitor Continue D5 IVF Sodium checks q6h  Pressure ulcers 2/2 failure to thrive. Present on right knee, right lower leg, right heel, right  medial foot, left foot.  Plan Wound care consult. Appreciate their recs on dressing changes Turn q2h Vancomycin for cellulitis of RLE. Will likely need to continue this on discharge.  UTI/ Urinary retention. New since hip fx over labor day. Has had foley in since that time for urinary retention. Now has UTI as found on UA. Poor UOP Plan Rocephin for UTI Remove foley. Allow voiding trial. Bladder scan q4h. Straight cath as needed for retention. flomax for retention Repeat cbc in am    Best practice:  CODE STATUS: full Diet: dysphagia 3 diet Pain management: tylenol DVT for prophylaxis: lovenox Social considerations/Family communication: pt's son, [name], traveled here from [place] last night and should be available for questions. Dispo: unlikely to be able to discharge today in light of hypotension. D/c to hospice pending medical stabilization. Will return to [facility] under hospice.   Mitzi Hansen, MD INTERNAL MEDICINE RESIDENT PGY-1 PAGER #: [phone] 09/05/19 5:14 AM

## 2019-09-05 NOTE — Progress Notes (Signed)
Hydrologist [facility])  Hospital Liaison: RN note   Notified by Transition of Care Manger of patient/family request for [facility] services at home after discharge. Chart and patient information reviewed by [facility] physician. Hospice eligibility confirmed.     Please send signed and completed DNR form home with patient/family. Patient will need prescriptions for discharge comfort medications.     [facility] requests the following DME for delivery to the home: Hospital bed. [place] equipment manager has been notified and will contact [employer] to arrange delivery to the home. Home address has been verified and is correct in the chart.  Alma Love at [facility] is the person to contact to arrange time of delivery.     [facility] Referral Center aware of the above. Please notify [facility] when patient is ready to leave the unit at discharge. (Call [phone] or [phone] after 5pm.) [facility] information and contact numbers given to   .      Please call with any hospice related questions.     Thank you for this referral.     Farrel Gordon, RN, CCM  [place] (listed on AMION under Hospice and [facility] [place])  [phone]

## 2019-09-05 NOTE — Progress Notes (Signed)
Patient refused to eat breakfast and take her pills. MD notified.

## 2019-09-05 NOTE — Progress Notes (Signed)
Hydrologist [facility])  Hospital Liaison: RN note   [place] Intake center was notified by patient's son that he does not want hospice, he is requesting Palliative care instead.   [facility] Palliative team will follow up with patient to provide service once she discharges from the Hospital.   Please call with any hospice or palliative related questions.     Thank you for this referral.     Farrel Gordon, RN, CCM  [place] (listed on AMION under Hospice and [facility] [place])  [phone]

## 2019-09-05 NOTE — Progress Notes (Signed)
  Date: 09/05/2019  Patient name: Victoria Lloyd  Medical record number: [id number]  Date of birth: [birth year]        I have seen and evaluated this patient and I have discussed the plan of care with the house staff. Please see their note for complete details. I concur with their findings.  Patient has lactic acidosis and hypotension; suspect this is due to hypovolemia and under perfusion of her tissues due to dehydration and poor po intake. Less likely infection but she is already on broad spectrum antibiotics for possible UTI and cellulitis. Bolus with isotonic fluids PRN to maintain MAP > 65. Continue D5W for her hypernatremia; but this will not help with volume re expansion.   She was having a PICC line placed this morning due to difficult access but team will come back to re evaluate wounds this afternoon. Urine culture not indicative of infection. If wounds look improved may be able to discontinue IV antibiotics. Otherwise will transition to PO to complete a 5 day course.   Velna Ochs, MD 09/05/2019, 11:09 AM

## 2019-09-05 NOTE — Consult Note (Signed)
[facility] Nurse wound consult note Reason for Consult:multiples areas of pressure injury, patient with decreased nutritional status, decreased mobility, fecal incontinence, an indwelling urinary catheter for urinary retention, etc. Patient failed trial with home care RNs recently due to intermittent nature of services. Plan is to discharge with Hospice services when discharged from this facility. Wound type:Pressure. Presentation consistent with Skin Changes at Life's End (SCALE) Pressure Injury POA: Yes Measurement:Per Nursing flow sheet Wound bed: Right medial knee with yellow slough dissolving via autolysis. Left shoulder, bilateral heels with deep tissue pressure injuries, several toes with circulatory infarcts and purple discoloration, bilateral LEs with tears and superimposed purple discolorations. Drainage (amount, consistency, odor) Right medial knee with light yellow exudate. Serous drainage in a small amount from LEs and left shoulder. Periwound:dry Dressing procedure/placement/frequency: A mattress replacement with low air loss feature has been ordered, but is not yet in place as low bed was placed for safety. I have ordered bilateral pressure redistribution heel boots and conservative topical care to the ulcers using xeroform gauze as a wound contact layer topped with dry gauze and silicone foam dressings. It should be noted that these wounds are not likely to improve given the patient's overall condition and her lack of nutrition. Goals are to improve where possible, as well as minimize odor and pain from ulcerations.   [place] nursing team will not follow, but will remain available to this patient, the nursing and medical teams.  Please re-consult if needed. Thanks, Maudie Flakes, MSN, RN, [place], Arther Abbott  Pager# [phone]

## 2019-09-05 NOTE — TOC Initial Note (Addendum)
Transition of Care [facility]) - Initial/Assessment Note    Patient Details  Name: Victoria Lloyd MRN: [id number] Date of Birth: 1922/10/01  Transition of Care [facility]) CM/SW Contact:    Gelene Mink, [place] Phone Number: 09/05/2019, 10:14 AM  Clinical Narrative:                  CSW called and spoke with the patient's son, Franky Macho. [name] stated that he would like his mother to return back to [facility]. He stated that Hospice is now going to be following her and providing care. CSW asked if he knew which agency, he stated that it was [facility]. CSW asked if he had a contact with [facility] just so that CSW could confirm discharge plans. He stated that the admissions director, Fulton Mole has been coordinating his mother's care. She can be reached at [phone].   CSW called and spoke with Ms. Alma Love at [facility]. Ms. Erling Cruz stated that they would be able to take the patient back under hospice care. She stated that she has been working with [employer] She asked the CSW if she could contact [facility] and order a hospital bed. She stated that they would need a signed FL2 with discharge meds. They will not require an updated COVID.   CSW called Farrel Gordon with [facility]. She is working on Education officer, museum services for the patient. CSW asked for a hospital bed. Bevely Palmer stated she will continue to follow the patient in the hospital.   UPDATE: Bevely Palmer called the CSW back and stated she would be going to [facility] with palliative to follow. She stated a hospital bed will need to be ordered through [place]. CSW will follow up with this on Tuesday. CSW will need a DME order for a hospital bed.   CSW will continue to follow and assist with discharge planning.    Expected Discharge Plan: Assisted Living Barriers to Discharge: Continued Medical Work up   Patient Goals and CMS Choice Patient states their goals for this hospitalization and ongoing recovery  are:: Pt will return to [place] under [place]   Choice offered to / list presented to : NA  Expected Discharge Plan and Services Expected Discharge Plan: Assisted Living In-house Referral: Clinical Social Work Discharge Planning Services: Other - See comment([facility] has started hospice services.) Post Acute Care Choice: NA Living arrangements for the past 2 months: [place], [place]                 DME Arranged: N/A DME Agency: NA                  Prior Living Arrangements/Services Living arrangements for the past 2 months: [place], [place] Lives with:: Facility Resident Patient language and need for interpreter reviewed:: No Do you feel safe going back to the place where you live?: Yes      Need for Family Participation in Patient Care: Yes (Comment) Care giver support system in place?: Yes (comment) Current home services: Other (comment), DME(Hospice will be starting services) Criminal Activity/Legal Involvement Pertinent to Current Situation/Hospitalization: No - Comment as needed  Activities of Daily Living      Permission Sought/Granted Permission sought to share information with : Case Manager Permission granted to share information with : Yes, Verbal Permission Granted     Permission granted to share info w AGENCY: [facility]        Emotional  Assessment Appearance:: Appears stated age Attitude/Demeanor/Rapport: Unable to Assess Affect (typically observed): Unable to Assess Orientation: : (Disorientated x4) Alcohol / Substance Use: Not Applicable Psych Involvement: No (comment)  Admission diagnosis:  Dehydration [E86.0] Lower urinary tract infectious disease [N39.0] Hypernatremia [E87.0] Failure to thrive in adult [R62.7] Patient Active Problem List   Diagnosis Date Noted  . Pressure injury of skin 09/03/2019  . Failure to thrive in adult 09/03/2019  . Hypernatremia  09/03/2019  . Dehydration   . Left displaced femoral neck fracture ([place]) 07/31/2019  . Dementia ([place]) 07/31/2019  . Altered mental status 07/28/2019  . Acute encephalopathy 08/19/2018  . Fall at home, initial encounter 02/02/2018  . Fever of unknown origin (FUO) 02/01/2018  . Lower urinary tract infectious disease   . Anxiety 06/03/2016  . SOB (shortness of breath) 06/03/2016  . Elevated troponin 06/03/2016  . CHF exacerbation ([place]) 06/03/2016  . Acute on chronic diastolic (congestive) heart failure ([place])   . Syncope 04/24/2016  . Pacemaker- St Jude impalnted 08/04/14 08/06/2014  . AV block, 3rd degree (HCC) 08/03/2014  . Acute CHF- secondary to bradycardia on admision (Nl LVF by echo) 08/03/2014  . CKD (chronic kidney disease) stage 4, GFR 15-29 ml/min (HCC) 08/03/2014  . Bradycardia 08/03/2014  . Hypertension 03/15/2011   PCP:  Patient, No Pcp Per Pharmacy:  No Pharmacies Listed    Social Determinants of Health (SDOH) Interventions    Readmission Risk Interventions No flowsheet data found.

## 2019-09-06 DIAGNOSIS — Z7189 Other specified counseling: Secondary | ICD-10-CM

## 2019-09-06 LAB — CBC
HCT: 35.9 % — ABNORMAL LOW (ref 36.0–46.0)
Hemoglobin: 11.1 g/dL — ABNORMAL LOW (ref 12.0–15.0)
MCH: 28.3 pg (ref 26.0–34.0)
MCHC: 30.9 g/dL (ref 30.0–36.0)
MCV: 91.6 fL (ref 80.0–100.0)
Platelets: 178 10*3/uL (ref 150–400)
RBC: 3.92 MIL/uL (ref 3.87–5.11)
RDW: 16.5 % — ABNORMAL HIGH (ref 11.5–15.5)
WBC: 10.6 10*3/uL — ABNORMAL HIGH (ref 4.0–10.5)
nRBC: 0.2 % (ref 0.0–0.2)

## 2019-09-06 LAB — GLUCOSE, CAPILLARY
Glucose-Capillary: 118 mg/dL — ABNORMAL HIGH (ref 70–99)
Glucose-Capillary: 153 mg/dL — ABNORMAL HIGH (ref 70–99)
Glucose-Capillary: 154 mg/dL — ABNORMAL HIGH (ref 70–99)

## 2019-09-06 LAB — BASIC METABOLIC PANEL
Anion gap: 12 (ref 5–15)
BUN: 10 mg/dL (ref 8–23)
CO2: 17 mmol/L — ABNORMAL LOW (ref 22–32)
Calcium: 7.3 mg/dL — ABNORMAL LOW (ref 8.9–10.3)
Chloride: 111 mmol/L (ref 98–111)
Creatinine, Ser: 0.58 mg/dL (ref 0.44–1.00)
GFR calc Af Amer: >60 mL/min (ref >60)
GFR calc non Af Amer: >60 mL/min (ref >60)
Glucose, Bld: 134 mg/dL — ABNORMAL HIGH (ref 70–99)
Potassium: 4.4 mmol/L (ref 3.5–5.1)
Sodium: 140 mmol/L (ref 135–145)

## 2019-09-06 LAB — SARS CORONAVIRUS 2 BY RT PCR (HOSPITAL ORDER, PERFORMED IN ~~LOC~~ HOSPITAL LAB): SARS Coronavirus 2: NEGATIVE

## 2019-09-06 MED ORDER — POTASSIUM CHLORIDE 10 MEQ/100ML IV SOLN
10.0000 meq | INTRAVENOUS | Status: AC
  Administered 2019-09-06 (×3): 10 meq via INTRAVENOUS
  Filled 2019-09-06 (×3): qty 100

## 2019-09-06 MED ORDER — ENOXAPARIN SODIUM 300 MG/3ML IJ SOLN
18.0000 mg | Freq: Every day | INTRAMUSCULAR | Status: DC
  Administered 2019-09-07: 10:00:00 18 mg via SUBCUTANEOUS
  Filled 2019-09-06: qty 0.18

## 2019-09-06 NOTE — TOC Progression Note (Addendum)
Transition of Care [facility]) - Progression Note    Patient Details  Name: Victoria Lloyd MRN: [id number] Date of Birth: 01/19/1922  Transition of Care [facility]) CM/SW [place], [place] Phone Number: [phone] 09/06/2019, 4:40 PM  Clinical Narrative:     CSW has paged resident to please input DME orders for hospital bed so that this can be delivered prior to patient's discharge back to [facility] tomorrow. Pending orders at this time.   CSW spoke with [facility] who also reports that they need updated COVID test despite previous CSW's note from 10/12. CSW has requested COVID test orders.   Expected Discharge Plan: Assisted Living Barriers to Discharge: Continued Medical Work up  Expected Discharge Plan and Services Expected Discharge Plan: Assisted Living In-house Referral: Clinical Social Work Discharge Planning Services: Other - See comment([facility] has started hospice services.) Post Acute Care Choice: NA Living arrangements for the past 2 months: [place], [place]                 DME Arranged: N/A DME Agency: NA                   Social Determinants of Health (SDOH) Interventions    Readmission Risk Interventions No flowsheet data found.

## 2019-09-06 NOTE — Progress Notes (Signed)
Saw order for covid test but not until tomorrow am, per SW note need prior to plan return to [facility] tomorrow. Paged MD to clarify order.

## 2019-09-06 NOTE — Progress Notes (Addendum)
NAME:  Victoria Lloyd, MRN:  [id number], DOB:  1922-06-06, LOS: 3 ADMISSION DATE:  09/03/2019, Primary: Patient, No Pcp Per  CHIEF COMPLAINT: failure to thrive  Medical Service: Internal Medicine Teaching Service         Attending Physician: Dr. Velna Ochs, MD    First Contact: Dr. Darrick Meigs Pager: [phone]  Second Contact: Dr. Sharon Seller Pager: [phone]       After Hours (After 5p/  First Contact Pager: [phone]  weekends / holidays): Second Contact Pager: [phone]    Brief History  85 yo female with recent fall resulting in hip fx s/p fixation in September who presented to ED with findings consistent with failure to thrive and severe hypernatremia  Subjective  ST at bedside. Patient sleeping. Did have a couple of episodes of hypotension overnight that resolved with IVF bolus.  Objective   Blood pressure (!) 100/58, pulse 75, temperature (!) 97.4 F (36.3 C), temperature source Oral, resp. rate 14, height 5\' 3"  (1.6 m), weight 36.7 kg, SpO2 98 %.     Intake/Output Summary (Last 24 hours) at 09/06/2019 0524 Last data filed at 09/06/2019 0333 Gross per 24 hour  Intake 3275.65 ml  Output 530 ml  Net 2745.65 ml   Filed Weights   09/04/19 0537 09/05/19 0116 09/06/19 0330  Weight: 49.9 kg 50.9 kg 36.7 kg    Examination: General: in no acute distress Heart: RRR Lungs sounds clear. Skin: several intact dressings to pressure ulcers. See media tab for photos. No change in RLE wound.  Consults:  hospice  Micro Data:  10/10 UC>>no isolate 10/10 BC>>NGTD  Antimicrobials:  Rocephin 10/10>>10/13 Vancomycin 10/10>>  Labs    CBC Latest Ref Rng & Units 09/06/2019 09/05/2019 09/04/2019  WBC 4.0 - 10.5 K/uL 10.6(H) 9.3 9.2  Hemoglobin 12.0 - 15.0 g/dL 11.1(L) 9.2(L) 11.4(L)  Hematocrit 36.0 - 46.0 % 35.9(L) 29.8(L) 39.6  Platelets 150 - 400 K/uL 178 142(L) 154    Stable/resolved problems   Dementia. Continue Risperdal. Diastolic heart failure. Does not appear  to be in exacerbation at this time. Continue daily weights. Strict I/o HTN: hold antihypertensives. Failure to thrive PT/OT.  Encourage nutrition with dysphagia 3 diet Will likely need PEG down the road if remaining full code and refusing po Lactic acidosis. Improving. LA 4.3->>2.8 as of last night. Bicarb improving.  BP soft but stable. Still afebrile. Blood cultures NTGD. Unlikely infectious etiology.  Likely related to poor po intake>>hypotension.  Hypernatremia--Resolved 161 on admission. Likely in setting of failure to thrive and poor po intake. 1.3L free water deficit.  Na 143 last pm Plan D5 1/2NS IVF.   Summary  85 yo female who developed failure thrive following hip fixation 2/2 fall and hip fracture in September. Found to be severely hypernatremic upon arrival to ED and was subsequently admitted for management  Assessment & Plan:  Active Problems:   Lower urinary tract infectious disease   Altered mental status   Pressure injury of skin   Failure to thrive in adult   Hypernatremia   Hypotension    Pressure ulcers 2/2 failure to thrive. Present on right knee, right lower leg, right heel, right medial foot, left foot.  Plan Wound care consult. Appreciate their recs on dressing changes Turn q2h Vancomycin for cellulitis of RLE. Will likely need to continue this on discharge.  RESOLVED. UTI/ Urinary retention. New since hip fx over labor day. Has had foley in since that time for urinary retention. Foley exchanged on admission Still  having poor UOP. 24h I/O: in 3275, 530 out. Hoping this will improve today with having hopefully reached sufficient hydration Cr stable. UA without isolate. Plan D/c rocephin Foley cares flomax    Best practice:  CODE STATUS: full Diet: dysphagia 3 diet Pain management: tylenol DVT for prophylaxis: lovenox Social considerations/Family communication: chart review indicates son does not wish for hospice. Wants palliative care. Dispo:  Will return to [facility]. Will further address anorexia and possible need for PEG with her son. I do not believe that this would serve the patient's best interest and improve quality of life in light of her other comorbidities.   Mitzi Hansen, MD INTERNAL MEDICINE RESIDENT PGY-1 PAGER #: [phone] 09/06/19 5:24 AM

## 2019-09-06 NOTE — Progress Notes (Signed)
Dressing changes done as ordered, patient has been turned every two hours. She drank a 4oz cup of grape juice this morning, more alert and was able to tell me her name.

## 2019-09-06 NOTE — Progress Notes (Signed)
Pt received from day nurse, pt alert to self, pt will try to bite and push when we went to turn, pt had dressings changed already, IV fluids infusing as ordered, bed in low bed with alarm on

## 2019-09-06 NOTE — Progress Notes (Signed)
Patient's son called and wanted to get updated on his mother condition, all questions answered to his satisfaction.

## 2019-09-06 NOTE — Progress Notes (Signed)
  Speech Language Pathology Treatment: Dysphagia  Patient Details Name: Victoria Lloyd MRN: [id number] DOB: Aug 21, 1922 Today's Date: 09/06/2019 Time: [id number] SLP Time Calculation (min) (ACUTE ONLY): 11 min  Assessment / Plan / Recommendation Clinical Impression  Pt is alert but minimally communicative. She does not initiate oral intake - biting on the straw or sticking her tongue out toward it. With Total A she takes in small amounts of liquid that were siphoned via straw, and swallows them without overt s/s of aspiration. Per review of chart and discussion with MD, her intake continues to be very limited. I question her ability to adequately support herself on POs alone, although she is capable of swallowing per previous SLP evaluations. PEG would likely not be indicated given her declining cognitive status. Per MD, plan is to discuss [place] more with son. Will continue to follow.    HPI HPI: 85 yo female who is presenting today for altered mental status. History is obtained from chart review.  She had a hip fracture that occured over labor day.  She has been been refusing all care since that time. She has been bed bound since that time as well and has, overall, developed a failure to thrive. Today, it is noted that she developed dyspnea.  Upon arrival to ED, pt found to be hypernatremic--161 and have UTI. Per son's report, patient has had declining mentation since the surgery. She really isn't eating much.   Most recent chest xray was showing no acute cardiopulmonary abnormality.      SLP Plan  Continue with current plan of care       Recommendations  Diet recommendations: Dysphagia 3 (mechanical soft);Thin liquid Liquids provided via: Cup;Straw Medication Administration: Whole meds with liquid Supervision: Staff to assist with self feeding;Full supervision/cueing for compensatory strategies Compensations: Slow rate;Small sips/bites Postural Changes and/or Swallow Maneuvers: Seated  upright 90 degrees                Oral Care Recommendations: Oral care BID Follow up Recommendations: Other (comment)(tba) SLP Visit Diagnosis: Dysphagia, unspecified (R13.10) Plan: Continue with current plan of care       [place] Derrel Moore 09/06/2019, 11:12 AM  Pollyann Glen, M.A. [place] Acute Environmental education officer [phone] Office [phone]

## 2019-09-07 ENCOUNTER — Telehealth: Payer: Self-pay

## 2019-09-07 DIAGNOSIS — E860 Dehydration: Secondary | ICD-10-CM

## 2019-09-07 DIAGNOSIS — Z91013 Allergy to seafood: Secondary | ICD-10-CM

## 2019-09-07 DIAGNOSIS — Z681 Body mass index (BMI) 19 or less, adult: Secondary | ICD-10-CM

## 2019-09-07 LAB — GLUCOSE, CAPILLARY
Glucose-Capillary: 93 mg/dL (ref 70–99)
Glucose-Capillary: 117 mg/dL — ABNORMAL HIGH (ref 70–99)

## 2019-09-07 NOTE — NC FL2 (Signed)
[place] MEDICAID FL2 LEVEL OF CARE SCREENING TOOL     IDENTIFICATION  Patient Name: Victoria Lloyd Birthdate: 06/16/1922 Sex: female Admission Date (Current Location): 09/03/2019  [facility] and [place] Number:  Herbalist and Address:  [facility]. [facility], [place] [address] [place], [place] [address]      Provider Number: [id number]  Attending Physician Name and Address:  Velna Ochs, MD  Relative Name and Phone Number:  [name] (son) [phone]    Current Level of Care: Hospital Recommended Level of Care: Memory Care Prior Approval Number:    Date Approved/Denied:   PASRR Number: [id number]  Discharge Plan: Other (Comment)([facility])    Current Diagnoses: Patient Active Problem List   Diagnosis Date Noted  . Goals of care, counseling/discussion   . Hypotension   . Pressure injury of skin 09/03/2019  . Failure to thrive in adult 09/03/2019  . Hypernatremia 09/03/2019  . Dehydration   . Left displaced femoral neck fracture ([place]) 07/31/2019  . Dementia ([place]) 07/31/2019  . Altered mental status 07/28/2019  . Acute encephalopathy 08/19/2018  . Fall at home, initial encounter 02/02/2018  . Fever of unknown origin (FUO) 02/01/2018  . Lower urinary tract infectious disease   . Anxiety 06/03/2016  . SOB (shortness of breath) 06/03/2016  . Elevated troponin 06/03/2016  . CHF exacerbation ([place]) 06/03/2016  . Acute on chronic diastolic (congestive) heart failure ([place])   . Syncope 04/24/2016  . Pacemaker- St Jude impalnted 08/04/14 08/06/2014  . AV block, 3rd degree (HCC) 08/03/2014  . Acute CHF- secondary to bradycardia on admision (Nl LVF by echo) 08/03/2014  . CKD (chronic kidney disease) stage 4, GFR 15-29 ml/min (HCC) 08/03/2014  . Bradycardia 08/03/2014  . Hypertension 03/15/2011    Orientation RESPIRATION BLADDER Height & Weight     Self  Normal Incontinent, External catheter Weight: 80 lb 7.5 oz (36.5  kg) Height:  5\' 3"  (160 cm)  BEHAVIORAL SYMPTOMS/MOOD NEUROLOGICAL BOWEL NUTRITION STATUS      Incontinent Diet(Dysphagia 3 diet)  AMBULATORY STATUS COMMUNICATION OF NEEDS Skin   Extensive Assist Verbally Skin abrasions, PU Stage and Appropriate Care, Other (Comment)(abrasions on back, ecchymosis right knee, skin tear left and right legs, pressure injury on heels, toes and hips)                       Personal Care Assistance Level of Assistance  Bathing, Feeding, Dressing, Total care Bathing Assistance: Maximum assistance Feeding assistance: Maximum assistance Dressing Assistance: Maximum assistance Total Care Assistance: Maximum assistance   Functional Limitations Info  Sight, Hearing, Speech Sight Info: Adequate Hearing Info: Adequate Speech Info: Adequate    SPECIAL CARE FACTORS FREQUENCY                       Contractures Contractures Info: Not present    Additional Factors Info  Code Status, Allergies Code Status Info: full Allergies Info: Codeine, Sulfa Antibiotics, Lactose Intolerance, milk-related compounds             Discharge Medications: TAKE these medications       acetaminophen 325 MG tablet Commonly known as: TYLENOL Take 1-2 tablets (325-650 mg total) by mouth every 4 (four) hours as needed for mild pain. What changed: how much to take   calcium-vitamin D 500-200 MG-UNIT tablet Commonly known as: OSCAL WITH D Take 1 tablet by mouth at bedtime.   collagenase ointment Commonly known as: Corporate treasurer  1 application topically 3 (three) times daily. Right leg   oxyCODONE 5 MG immediate release tablet Commonly known as: Oxy IR/ROXICODONE Take 1 tablet (5 mg total) by mouth every 6 (six) hours as needed for moderate pain or severe pain.   pantoprazole 20 MG tablet Commonly known as: PROTONIX Take 1 tablet (20 mg total) by mouth 2 (two) times daily.   risperiDONE 1 MG/ML oral solution Commonly known as: RISPERDAL Take 1.25 mg by  mouth at bedtime.   senna 8.6 MG Tabs tablet Commonly known as: SENOKOT Take 2 tablets (17.2 mg total) by mouth daily.   tamsulosin 0.4 MG Caps capsule Commonly known as: FLOMAX Take 0.4 mg by mouth daily.      Relevant Imaging Results:  Relevant Lab Results:   Additional Information SSN: [id number]  Alberteen Sam, LCSW

## 2019-09-07 NOTE — Progress Notes (Signed)
Report was given to [place] in [facility].  Discharge packet given to [facility].  Patient transported by [facility].

## 2019-09-07 NOTE — Progress Notes (Signed)
NAME:  Victoria Lloyd, MRN:  [id number], DOB:  Jun 29, 1922, LOS: 4 ADMISSION DATE:  09/03/2019, Primary: Patient, No Pcp Per  CHIEF COMPLAINT: failure to thrive  Medical Service: Internal Medicine Teaching Service         Attending Physician: Dr. Velna Ochs, MD    First Contact: Dr. Darrick Lloyd Pager: [phone]  Second Contact: Dr. Sharon Lloyd Pager: [phone]       After Hours (After 5p/  First Contact Pager: [phone]  weekends / holidays): Second Contact Pager: [phone]    Brief History  85 yo female with recent fall resulting in hip fx s/p fixation in September who presented to ED with findings consistent with failure to thrive and severe hypernatremia  Subjective  No overnight events. Awake and eating this morning with the assistance of floor staff.  Objective   Blood pressure (!) 107/40, pulse 92, temperature 98.5 F (36.9 C), temperature source Oral, resp. rate 19, height 5\' 3"  (1.6 m), weight 36.7 kg, SpO2 100 %.     Intake/Output Summary (Last 24 hours) at 09/07/2019 0507 Last data filed at 09/06/2019 1850 Gross per 24 hour  Intake 428.33 ml  Output 650 ml  Net -221.67 ml   Filed Weights   09/04/19 0537 09/05/19 0116 09/06/19 0330  Weight: 49.9 kg 50.9 kg 36.7 kg    Examination: General: chronically ill appearing Heart: RRR Lungs: clear to auscultation Neuro: a/o x1 Skin: several intact dressings to pressure ulcers. Large linear incision site to left hip.  Consults:  none  Micro Data:  10/10 UC>>no isolate 10/10 BC>>NGTD  Antimicrobials:  Rocephin 10/10>>10/13 Vancomycin 10/10>>10/13  Labs    CBC Latest Ref Rng & Units 09/06/2019 09/05/2019 09/04/2019  WBC 4.0 - 10.5 K/uL 10.6(H) 9.3 9.2  Hemoglobin 12.0 - 15.0 g/dL 11.1(L) 9.2(L) 11.4(L)  Hematocrit 36.0 - 46.0 % 35.9(L) 29.8(L) 39.6  Platelets 150 - 400 K/uL 178 142(L) 154    Stable/resolved problems   Dementia. Continue Risperdal. Diastolic heart failure. Does not appear to be in  exacerbation at this time. Continue daily weights. Strict I/o HTN: hold antihypertensives. Lactic acidosis. Improving. LA 4.3->>2.8 as of last night. Bicarb improving.  BP soft but stable. Still afebrile. Blood cultures NTGD. Unlikely infectious etiology.  Likely related to poor po intake>>hypotension.  Hypernatremia--Resolved 161 on admission. Likely in setting of failure to thrive and poor po intake. 1.3L free water deficit.  Na 143 last pm Plan D5 1/2NS IVF.   Summary  85 yo female who developed failure thrive following hip fixation 2/2 fall and hip fracture in September. Found to be severely hypernatremic upon arrival to ED and was subsequently admitted for management.  Assessment & Plan:  Active Problems:   Lower urinary tract infectious disease   Altered mental status   Pressure injury of skin   Failure to thrive in adult   Hypernatremia   Hypotension   Goals of care, counseling/discussion  Failure to thrive PT/OT.  Encourage nutrition with dysphagia 3 diet. Needs assist in feeding Will likely need PEG down the road if remaining full code and refusing po  Pressure ulcers 2/2 failure to thrive. Present on right knee, right lower leg, right heel, right medial foot, left foot.  Plan Wound care consult. Appreciate their recs on dressing changes Turn q2h  RESOLVED. UTI/ Urinary retention. New since hip fx over labor day. Has had foley in since that time for urinary retention. Foley exchanged on admission. Plan Foley cares flomax    Best practice:  CODE  STATUS: full Diet: dysphagia 3 diet Pain management: tylenol DVT for prophylaxis: lovenox Social considerations/Family communication: my attending Dr. Philipp Lloyd, had lengthy disucssion with patient's son, Victoria Lloyd, yesterday. See her attestation of my note for details.  Dispo: Will return to [facility]. Given her lack of po intake, she is high risk for readmission however we have medically optimized her and she would  require PEG if she continues to refuse po medications. If readmitted for similar reason, I would highly consider ethics consult as I do not believe that PEG and aggressive care would serve in her best interest and/or improve her quality of life.    Victoria Hansen, MD INTERNAL MEDICINE RESIDENT PGY-1 PAGER #: [phone] 09/07/19 5:07 AM

## 2019-09-07 NOTE — TOC Transition Note (Addendum)
Transition of Care [facility]) - CM/SW Discharge Note   Patient Details  Name: Victoria Lloyd MRN: [id number] Date of Birth: 1922-10-07  Transition of Care [facility]) CM/SW Contact:  Alberteen Sam, [place] Phone Number: [phone] 09/07/2019, 1:37 PM   Clinical Narrative:     Patient will DC to: [facility] Anticipated DC date: 09/07/2019 Family notified:Bert  Transport [id number]  Per MD patient ready for DC to [facility] . RN, patient, patient's family, and facility notified of DC. Discharge Summary sent to facility. RN given number for report   [phone] ask for Brittney . DC packet on chart. Patient set up with [facility] RN services. Ambulance transport requested for patient.  CSW signing off.  [place], [place]   Final next level of care: Assisted Living Barriers to Discharge: No Barriers Identified  . Patient Goals and CMS Choice Patient states their goals for this hospitalization and ongoing recovery are:: Pt will return to [facility] under [place] Medicare.gov Compare Post Acute Care list provided to:: Patient Represenative (must comment)(Bert (son)) Choice offered to / list presented to : Adult Children(Bert (son))  Discharge Placement PASRR number recieved: 09/07/19            Patient chooses bed at: [facility] Patient to be transferred to facility by: [place] Name of family member notified: [name] Patient and family notified of of transfer: 09/07/19  Discharge Plan and Services In-house Referral: Clinical Social Work Discharge Planning Services: Other - See comment([facility] has started hospice services.) Post Acute Care Choice: NA          DME Arranged: N/A DME Agency: NA       HH Arranged: RN Circle [facility]: [facility] [employer] Date [place]: 09/07/19 Time [place]: [place] Representative spoke with at [place]: [place] ([place])  Interventions     Readmission Risk Interventions No flowsheet data found.

## 2019-09-07 NOTE — Discharge Summary (Addendum)
Name: Victoria Lloyd MRN: [id number] DOB: 27-Jun-1922 85 y.o. PCP: Patient, No Pcp Per  Date of Admission: 09/03/2019 12:33 PM Date of Discharge:  Attending Physician: Velna Ochs, MD  Discharge Diagnosis: 1. Failure to thrive 2. Pressure ulcers  Discharge Medications: Allergies as of 09/07/2019      Reactions   Codeine Other (See Comments)   Unknown; patient cannot recall the reaction   Sulfa Antibiotics Other (See Comments)   Unknown; patient cannot recall the reaction   Lactose Intolerance (gi) Nausea And Vomiting   Milk-related Compounds Nausea And Vomiting   Other Other (See Comments)   "Seafood"- unknown reaction, listed on MAR.      Medication List    STOP taking these medications   ALPRAZolam 0.25 MG tablet Commonly known as: XANAX   aspirin 81 MG tablet   metoprolol tartrate 25 MG tablet Commonly known as: LOPRESSOR   ondansetron 4 MG disintegrating tablet Commonly known as: ZOFRAN-ODT   potassium chloride 10 MEQ tablet Commonly known as: KLOR-CON     TAKE these medications   acetaminophen 325 MG tablet Commonly known as: TYLENOL Take 1-2 tablets (325-650 mg total) by mouth every 4 (four) hours as needed for mild pain. What changed: how much to take   calcium-vitamin D 500-200 MG-UNIT tablet Commonly known as: OSCAL WITH D Take 1 tablet by mouth at bedtime.   collagenase ointment Commonly known as: SANTYL Apply 1 application topically 3 (three) times daily. Right leg   oxyCODONE 5 MG immediate release tablet Commonly known as: Oxy IR/ROXICODONE Take 1 tablet (5 mg total) by mouth every 6 (six) hours as needed for moderate pain or severe pain.   pantoprazole 20 MG tablet Commonly known as: PROTONIX Take 1 tablet (20 mg total) by mouth 2 (two) times daily.   risperiDONE 1 MG/ML oral solution Commonly known as: RISPERDAL Take 1.25 mg by mouth at bedtime.   senna 8.6 MG Tabs tablet Commonly known as: SENOKOT Take 2 tablets (17.2  mg total) by mouth daily.   tamsulosin 0.4 MG Caps capsule Commonly known as: FLOMAX Take 0.4 mg by mouth daily.            Durable Medical Equipment  (From admission, onward)         Start     Ordered   09/06/19 1753  For home use only DME Hospital bed  Once    Question Answer Comment  Length of Need Lifetime   The above medical condition requires: Patient requires the ability to reposition frequently   Bed type Semi-electric      09/06/19 1752          Disposition and follow-up:   Victoria Lloyd was discharged from [facility] in [place] condition.  At the hospital follow up visit please address:  1.  Failure to thrive. Will need further discussion regarding the appropriateness of PEG and full code if patient continues to decline. Dysphagia 3 diet. Thin liquids are ok.  2. Pressure ulcers. Most concerning one is the RLE. Does not appear infectious at this time. Did receive course of vancomycin 10/10-10/13 for possible infection. Will need close follow up on these pressure ulcers.  2.  Labs / imaging needed at time of follow-up: none  3.  Pending labs/ test needing follow-up: none  Follow-up Appointments: none   Hospital Course by problem list: 85 yo female with recent history of fall in September s/p hip fixation and arthroplasty. Pt known to have  dementia at base line. Her overall well being has been declining rapidly since the hip surgery including poor appetite, poor energy. She presented to the ED on 10/10 for evaluation of this decline and AMS. She was found have severe hypernatremia on admission--161 which resolved over the hospital course with D5 1/2NS. Patient was also noted to have lactic acidosis which was likely due to malnutrition>>hypotension>>poor tissue perfusion. The LA and hypotension improved with IVFs. Several discussions with the patient's son, [name], were held over the course of her hospitalization regarding the appropriateness of  being full code and continuing aggressive treatments. She is still refusing PO intake including fluids and medications which will make her a high risk for readmission. Despite educating the son regarding her prognosis, he maintained his wishes for her to stay FULL code. If he continues to feel this way, she would likely require a PEG tube placement. I do not feel that this would be at all appropriate in regards to the patient's best interest and quality of life.  At time of discharge, I believe we have optimized her medically. [facility].   Discharge Vitals:   BP 122/73 (BP Location: Right Arm)   Pulse (!) 108   Temp 98 F (36.7 C) (Oral)   Resp 19   Ht 5\' 3"  (1.6 m)   Wt 36.5 kg   LMP  (LMP Unknown)   SpO2 98%   BMI 14.25 kg/m    Signed: Mitzi Hansen, MD 09/07/2019, 10:51 AM   Pager: [phone]

## 2019-09-07 NOTE — Telephone Encounter (Signed)
Phone call received from patient's son, Victoria Lloyd, inquiring when NP can see patient. Patient is supposed to be d/c from hospital in a few hours to [facility]. Son provided patient's recent health history. Spoke with Windy Canny NP about scheduling visit. Per Windy Canny, she can see patient on Friday 09/09/2019 @ 3pm

## 2019-09-07 NOTE — Telephone Encounter (Signed)
received a message to call [place] at [facility] who is requesting a visit sooner than Friday 09/09/2019. Spoke with Windy Canny NP. Visit scheduled for 09/08/2019 @ [place] at [facility] updated

## 2019-09-07 NOTE — Progress Notes (Signed)
Spoke with RN about the DC midline she is aware that she can remove this line

## 2019-09-07 NOTE — Plan of Care (Signed)

## 2019-09-07 NOTE — Care Management Important Message (Signed)
Important Message  Patient Details  Name: Victoria Lloyd MRN: [id number] Date of Birth: 1922/04/14   Medicare Important Message Given:  Yes     Shelda Altes 09/07/2019, 2:26 PM

## 2019-09-08 ENCOUNTER — Non-Acute Institutional Stay: Payer: Self-pay | Admitting: Hospice

## 2019-09-08 ENCOUNTER — Other Ambulatory Visit: Payer: Self-pay

## 2019-09-08 VITALS — BP 122/54 | Resp 18

## 2019-09-08 DIAGNOSIS — Z515 Encounter for palliative care: Secondary | ICD-10-CM

## 2019-09-08 LAB — CULTURE, BLOOD (ROUTINE X 2)
Culture: NO GROWTH
Culture: NO GROWTH
Special Requests: ADEQUATE
Special Requests: ADEQUATE

## 2019-09-08 NOTE — Progress Notes (Addendum)
[place] Consult Note Telephone: [phone]  Fax: [phone]  PATIENT NAME: Victoria Lloyd DOB: 1922/05/19 MRN: [id number]  PRIMARY CARE PROVIDER:   Dr. Reymundo Poll [facility] REFERRING PROVIDER:  Dr. Reymundo Poll [facility] RESPONSIBLE PARTY:  Franky Macho, MD [id number]   RECOMMENDATIONS/PLAN:  1. Advance Care Planning/Goals of Care: Visit consisted of building trust and  discussions on Palliative Medicine as specialized medical care for people living with serious illness, aimed at facilitating advance care plan, symptoms relief and establishing goals of care. Patient was lethargic, non verbal during visit, FLACC 0.  Bert via telephone call during visit,  needed clarification on the role of Hospice because he did not want patient to receive aggressive end of life care yet; he wanted to give patient a fighting chance to live. Therapeutic listening and education on Hospice services vs Palliative care provided. He was satisfied with explanations provided; this NP left a file with flyers on Palliative care in patient's room for [employer].  Patient's goals include to maximize quality of life and symptom management. [name] affirms patient remains Full code. Dementia as a progressive terminal disease was also discussed and [name] acknowledged patient's overall functional decline. Patient is PPS 20%; she would benefit from Hospice services; [name] not ready at this time.  He is willing to continue to review status, goals of care and if Hospice should be pursued. This NP will visit patient next week 09/13/2019 to further assess and discuss plan and goals of care with [facility]. He expressed thanks for the visit and the information/support provided. 2. Symptom management: Dementia: FAST 7c, bed bound and cannot sit up without assistance; ongoing dysphagia, now on pureed diet. Patient bites down on spoon, takes in few bites and sips with lots of cueing. Regular frequent meals can be  offered with choking precautions.  I recommend Dietary consult, with possible Magic cup, Prostat. Enhanced nutrition will help with wound healing. Position change also discussed with clinical staff to help relieve pressure.  3. Follow up Palliative Care Visit: Palliative care will continue to follow for goals of care clarification and symptom management.   I spent 70 minutes providing this consultation,  from 9.00am to 10.10am. More than 50% of the time in this consultation was spent coordinating communication with son [name]), and clinical staff at the facility.   HISTORY OF PRESENT ILLNESS:  Victoria Lloyd is a 85 y.o. year old female with multiple medical problems including chronic diastolic CHF, CKD IV Alzheimer's Dementia, multiple pressure ulcers, Dysphagia III.  Palliative Care was asked to help address goals of care.   CODE STATUS: FULL  PPS: 20% HOSPICE ELIGIBILITY/DIAGNOSIS: TBD  PAST MEDICAL HISTORY:  Past Medical History:  Diagnosis Date  . Chronic diastolic (congestive) heart failure ([place])   . CKD (chronic kidney disease), stage IV ([place])   . Complete heart block (HCC)    a. s/p MDT dual chamber PPM   . Hypertension   . Stroke ([place]) 02/01/2018   CT scan =>  Chronic right PCA distribution infarct with right occipital lobe    SOCIAL HX:  Social History   Tobacco Use  . Smoking status: Never Smoker  . Smokeless tobacco: Never Used  Substance Use Topics  . Alcohol use: No    ALLERGIES:  Allergies  Allergen Reactions  . Codeine Other (See Comments)    Unknown; patient cannot recall the reaction  . Sulfa Antibiotics Other (See Comments)    Unknown; patient cannot recall the  reaction  . Lactose Intolerance (Gi) Nausea And Vomiting  . Milk-Related Compounds Nausea And Vomiting  . Other Other (See Comments)    "Seafood"- unknown reaction, listed on MAR.     PERTINENT MEDICATIONS:  Outpatient Encounter Medications as of 09/08/2019  Medication Sig  . acetaminophen  (TYLENOL) 325 MG tablet Take 1-2 tablets (325-650 mg total) by mouth every 4 (four) hours as needed for mild pain. (Patient taking differently: Take 650 mg by mouth every 4 (four) hours as needed for mild pain. )  . calcium-vitamin D (OSCAL WITH D) 500-200 MG-UNIT per tablet Take 1 tablet by mouth at bedtime.   . collagenase (SANTYL) ointment Apply 1 application topically 3 (three) times daily. Right leg  . oxyCODONE (OXY IR/ROXICODONE) 5 MG immediate release tablet Take 1 tablet (5 mg total) by mouth every 6 (six) hours as needed for moderate pain or severe pain.  . pantoprazole (PROTONIX) 20 MG tablet Take 1 tablet (20 mg total) by mouth 2 (two) times daily.  . risperiDONE (RISPERDAL) 1 MG/ML oral solution Take 1.25 mg by mouth at bedtime.  . senna (SENOKOT) 8.6 MG TABS tablet Take 2 tablets (17.2 mg total) by mouth daily.  . tamsulosin (FLOMAX) 0.4 MG CAPS capsule Take 0.4 mg by mouth daily.   No facility-administered encounter medications on file as of 09/08/2019.     PHYSICAL EXAM:   General: Sleeping/lethargic, non verbal during visit;  in no acute distress Cardiovascular: irregular rate and rhythm Pulmonary: clear ant fields,no SOB, no cough Abdomen: soft, nontender, + bowel sounds GU: Foley cath in place, clear light yellow urine in the bag Skin: pressure ulcers in multiple sites including bilateral hips, bilateral heels, bilateral shin, sacrum. Dressings intact, clean and dry. Neurological: Weakness, limited response; lethargic throughout visit and exam  Teodoro Spray, NP

## 2019-09-29 IMAGING — CT CT HEAD W/O CM
2 of 5 series · 11 of 47 positions shown, 13 images · non-contrast
Comparison: 08/20/2018

CLINICAL DATA: Mental status changes.

EXAM:
CT HEAD WITHOUT CONTRAST
TECHNIQUE: Contiguous axial images were obtained from the base of the skull
through the vertex without intravenous contrast.

[Series 5: head without cor · coronal · non-contrast · 0.32mm/px · 3 of 69 slices shown]
[im 23/69  brain]
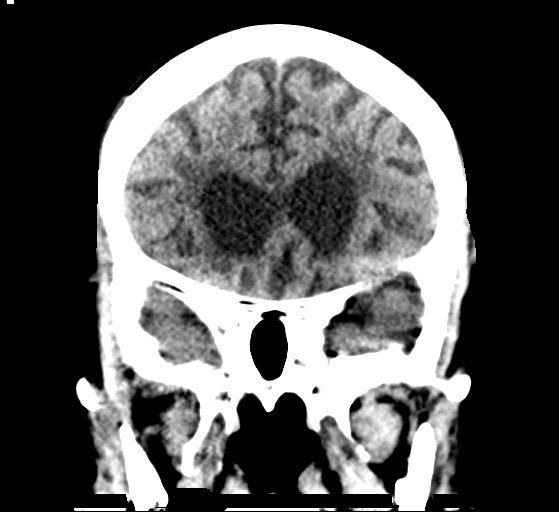
[im 31/69  brain]
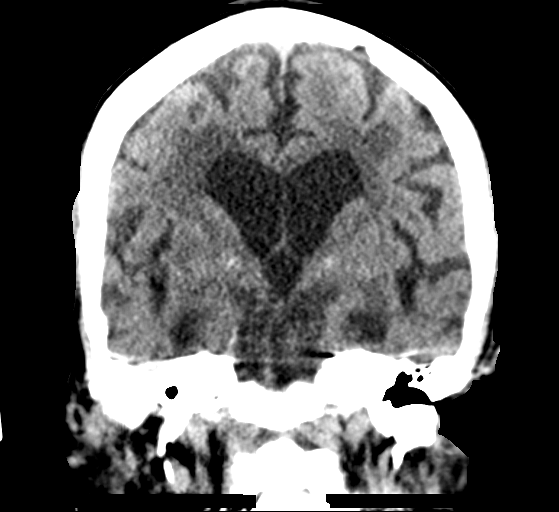
[im 38/69  brain]
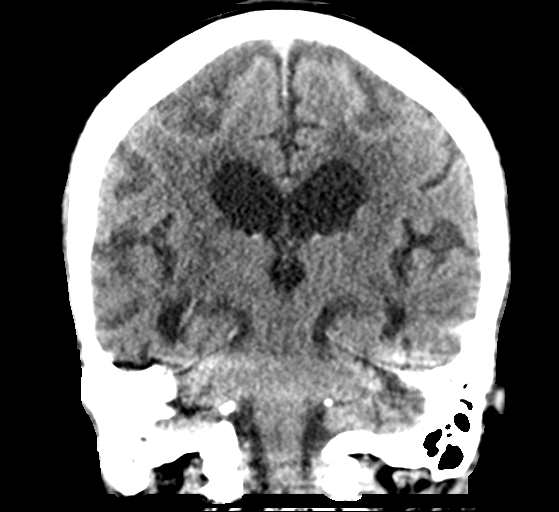

[Series 7: head without ax · axial · non-contrast · 0.33mm/px · z∈[+6,+127]mm · 8 of 57 slices shown, 10 images]
[im 7/57  brain]
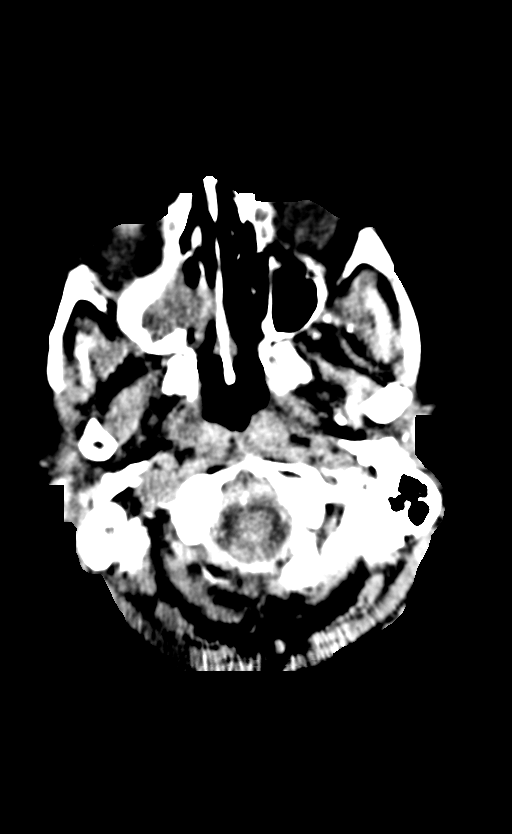
[im 7/57  bone]
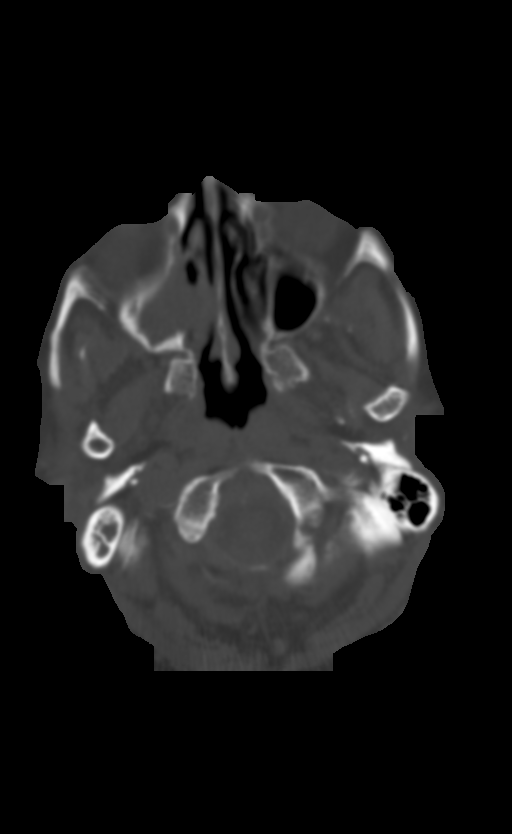
[im 13/57  brain]
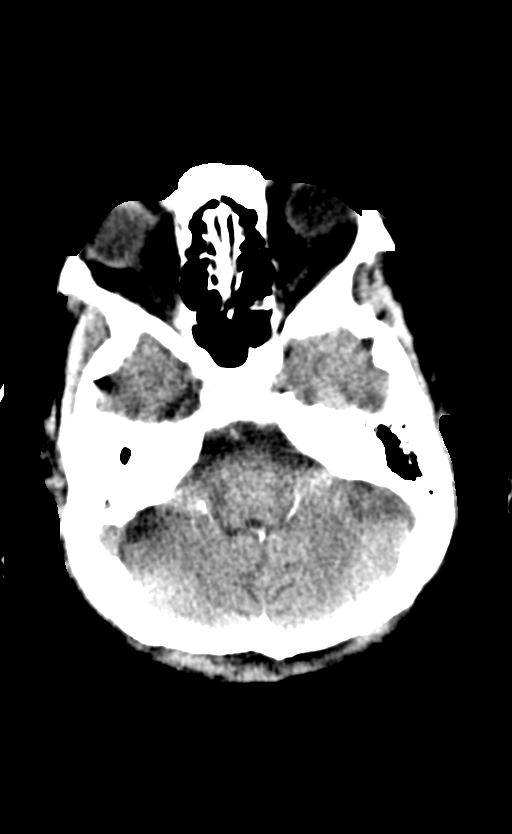
[im 19/57  brain]
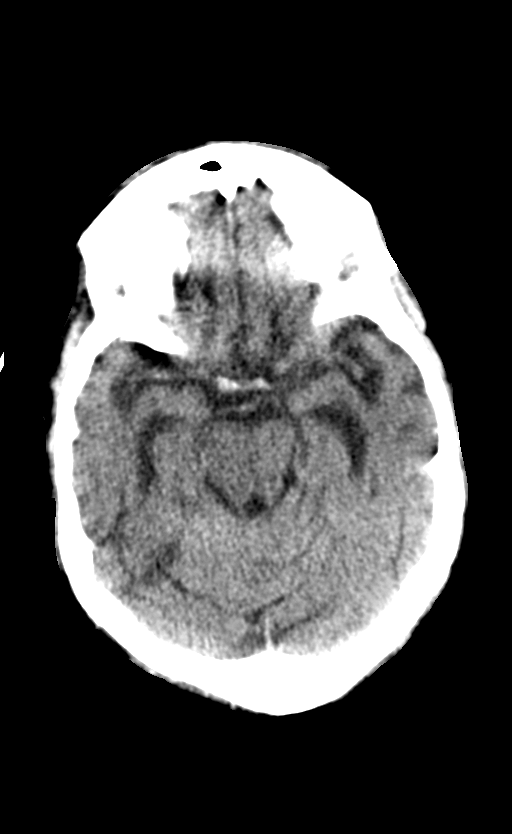
[im 25/57  brain]
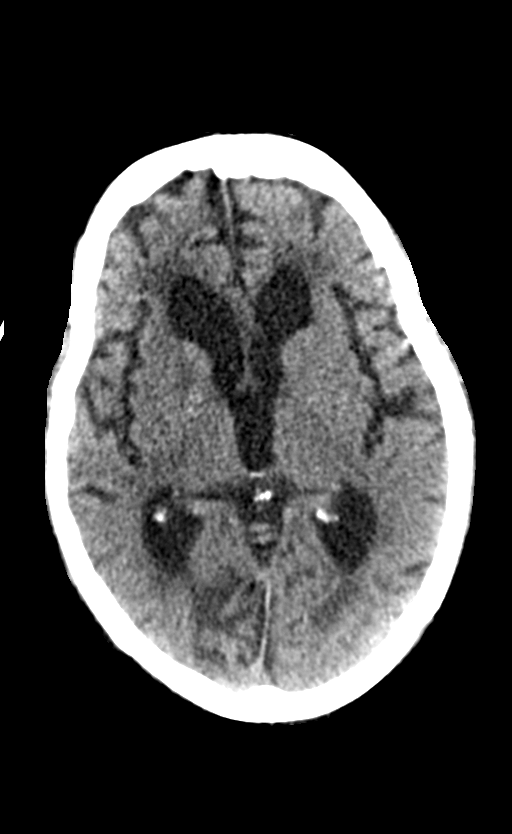
[im 32/57  brain]
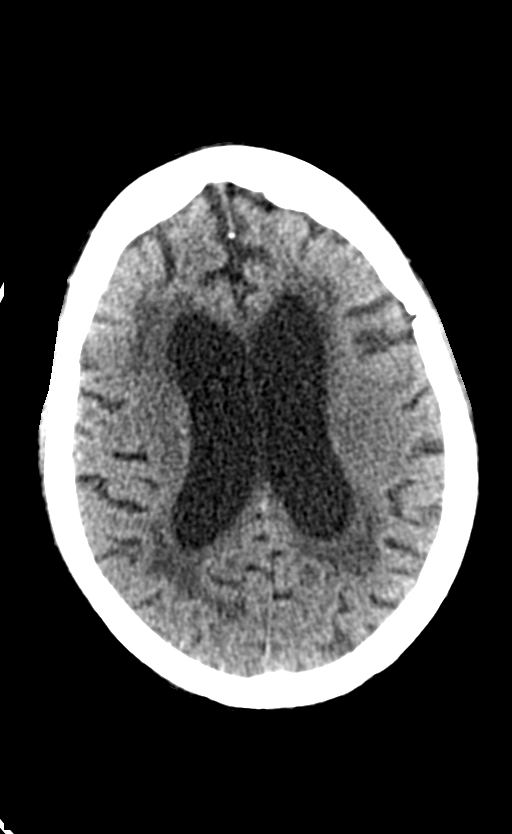
[im 32/57  bone]
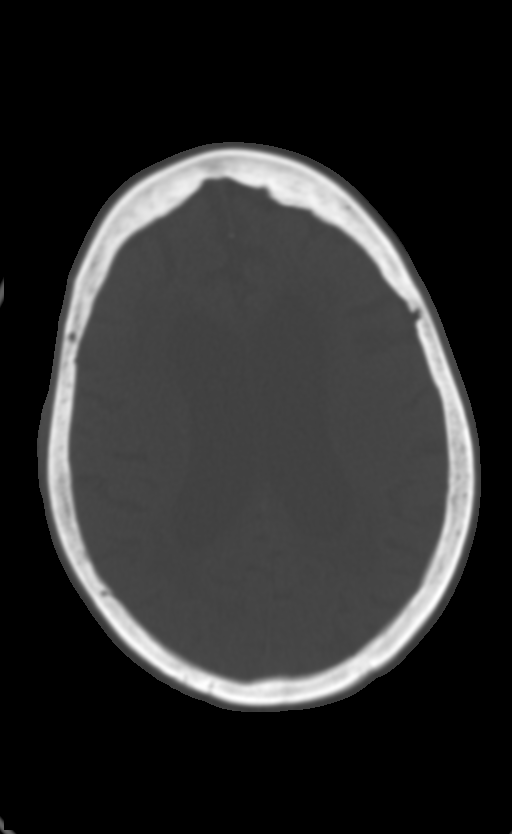
[im 38/57  brain]
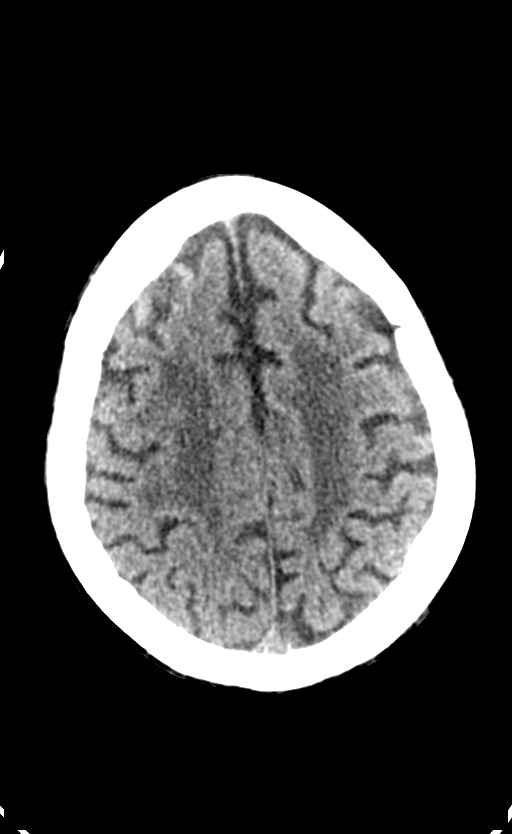
[im 44/57  brain]
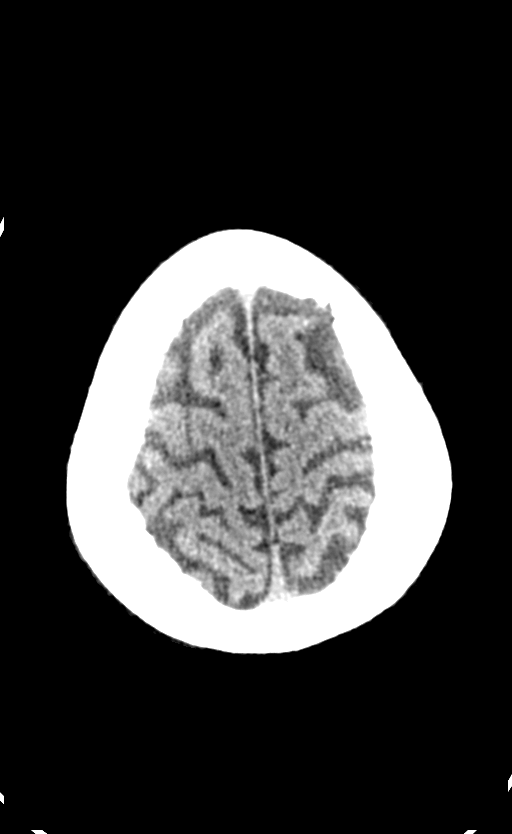
[im 50/57  brain]
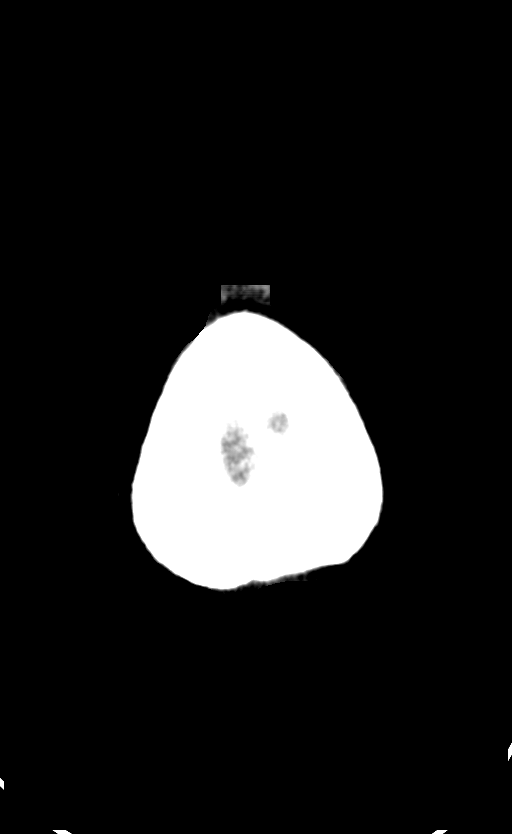

[11 of 47 positions shown; findings below may reference images not displayed]

FINDINGS: Brain: Expected cerebral and cerebellar volume loss for age. Marked
low density in the periventricular white matter likely related to
small vessel disease. No hemorrhage, hydrocephalus, intra-axial, or
extra-axial fluid collection. Marked cerebellar atrophy as well.
Remote infarct involving the medial right occipital lobe. Similar to
08/19/2018.

Vascular: Intracranial atherosclerosis.

Skull: Minimal motion degradation. Normal skull.

Sinuses/Orbits: Grossly normal orbits and globes. Opacification of
the right maxillary sinus persists. Mild left maxillary sinus
mucosal thickening. Right mastoid effusion. Right middle ear fluid
is slightly decreased.

Other: None.
IMPRESSION: 1. No acute intracranial abnormality.
2. Cerebral/cerebellar atrophy and small vessel ischemic change.
3. Remote right occipital lobe infarct.
4. Right mastoid and middle ear fluid. Slightly decreased right
middle ear fluid. Significant sinus disease.

## 2019-10-25 DEATH — deceased
